# Patient Record
Sex: Female | Born: 1965 | Race: White | Hispanic: No | Smoking: Never smoker
Health system: Southern US, Community
[De-identification: ages and names within clinical notes are randomized; demographics above are authoritative.]

## PROBLEM LIST (undated history)

## (undated) DIAGNOSIS — R519 Headache, unspecified: Secondary | ICD-10-CM

## (undated) DIAGNOSIS — B029 Zoster without complications: Secondary | ICD-10-CM

## (undated) DIAGNOSIS — R51 Headache: Secondary | ICD-10-CM

## (undated) DIAGNOSIS — C801 Malignant (primary) neoplasm, unspecified: Secondary | ICD-10-CM

## (undated) DIAGNOSIS — C539 Malignant neoplasm of cervix uteri, unspecified: Secondary | ICD-10-CM

## (undated) DIAGNOSIS — F419 Anxiety disorder, unspecified: Secondary | ICD-10-CM

## (undated) DIAGNOSIS — K219 Gastro-esophageal reflux disease without esophagitis: Secondary | ICD-10-CM

## (undated) DIAGNOSIS — I10 Essential (primary) hypertension: Secondary | ICD-10-CM

## (undated) DIAGNOSIS — N63 Unspecified lump in unspecified breast: Secondary | ICD-10-CM

## (undated) DIAGNOSIS — E785 Hyperlipidemia, unspecified: Secondary | ICD-10-CM

## (undated) DIAGNOSIS — G43909 Migraine, unspecified, not intractable, without status migrainosus: Secondary | ICD-10-CM

## (undated) HISTORY — DX: Essential (primary) hypertension: I10

## (undated) HISTORY — PX: NASAL SINUS SURGERY: SHX719

## (undated) HISTORY — DX: Migraine, unspecified, not intractable, without status migrainosus: G43.909

## (undated) HISTORY — PX: DILATION AND CURETTAGE OF UTERUS: SHX78

## (undated) HISTORY — DX: Unspecified lump in unspecified breast: N63.0

## (undated) HISTORY — DX: Hyperlipidemia, unspecified: E78.5

## (undated) HISTORY — DX: Malignant (primary) neoplasm, unspecified: C80.1

## (undated) HISTORY — DX: Malignant neoplasm of cervix uteri, unspecified: C53.9

---
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
## 2003-08-26 DIAGNOSIS — C801 Malignant (primary) neoplasm, unspecified: Secondary | ICD-10-CM

## 2003-08-26 DIAGNOSIS — C539 Malignant neoplasm of cervix uteri, unspecified: Secondary | ICD-10-CM

## 2003-08-26 HISTORY — PX: LEEP: SHX91

## 2003-08-26 HISTORY — PX: ABDOMINAL HYSTERECTOMY: SHX81

## 2003-08-26 HISTORY — PX: DIAGNOSTIC LAPAROSCOPY: SUR761

## 2003-08-26 HISTORY — DX: Malignant neoplasm of cervix uteri, unspecified: C53.9

## 2003-08-26 HISTORY — DX: Malignant (primary) neoplasm, unspecified: C80.1

## 2006-03-17 ENCOUNTER — Ambulatory Visit: Payer: Self-pay

## 2006-09-30 ENCOUNTER — Ambulatory Visit: Payer: Self-pay | Admitting: Family Medicine

## 2008-05-16 ENCOUNTER — Ambulatory Visit: Payer: Self-pay | Admitting: Internal Medicine

## 2009-05-31 ENCOUNTER — Ambulatory Visit: Payer: Self-pay | Admitting: Internal Medicine

## 2009-11-13 ENCOUNTER — Ambulatory Visit: Payer: Self-pay | Admitting: Internal Medicine

## 2010-04-09 ENCOUNTER — Ambulatory Visit: Payer: Self-pay

## 2011-04-05 ENCOUNTER — Ambulatory Visit: Payer: Self-pay

## 2011-04-22 ENCOUNTER — Ambulatory Visit: Payer: Self-pay | Admitting: Surgery

## 2012-10-19 ENCOUNTER — Ambulatory Visit: Payer: Self-pay | Admitting: Family Medicine

## 2013-05-10 ENCOUNTER — Encounter: Payer: Self-pay | Admitting: *Deleted

## 2013-05-30 ENCOUNTER — Encounter: Payer: Self-pay | Admitting: General Surgery

## 2013-05-30 ENCOUNTER — Other Ambulatory Visit: Payer: BC Managed Care – PPO

## 2013-05-30 ENCOUNTER — Ambulatory Visit (INDEPENDENT_AMBULATORY_CARE_PROVIDER_SITE_OTHER): Payer: BC Managed Care – PPO | Admitting: General Surgery

## 2013-05-30 VITALS — BP 138/80 | HR 72 | Resp 14 | Ht 69.0 in | Wt 185.0 lb

## 2013-05-30 DIAGNOSIS — N63 Unspecified lump in unspecified breast: Secondary | ICD-10-CM

## 2013-05-30 HISTORY — PX: BREAST CYST ASPIRATION: SHX578

## 2013-05-30 NOTE — Progress Notes (Signed)
Patient ID: Laura Barber, female   DOB: November 25, 1965, 47 y.o.   MRN: [id number]  Chief Complaint  Patient presents with  . Other    left breast lump    HPI Laura Barber is a 47 y.o. female who presents for an evaluation of a left breast lump. Patient states been there for two year. No pain or tender.Patient had her mammogram 05/16/13. She perform self breast check and get regular mammograms. The patient has seen two different doctors who have done mammograms and ultrasounds and were not concerned. No injuries to the breast.  The patient was evaluated locally by Laura Barber, M.D. She also reports having an evaluation with Laura Mao, MD at the [facility] breast imaging center.   HPI  Past Medical History  Diagnosis Date  . Hypertension   . Hyperlipidemia   . Cancer 2005    Cervical    Past Surgical History  Procedure Laterality Date  . Abdominal hysterectomy  2005  . Nasal sinus surgery      Family History  Problem Relation Age of Onset  . Lung cancer Mother     Social History History  Substance Use Topics  . Smoking status: Never Smoker   . Smokeless tobacco: Never Used  . Alcohol Use: Yes    Allergies  Allergen Reactions  . Claritin-D 12 Hour [Loratadine-Pseudoephedrine Er] Itching and Swelling  . Sulfa Antibiotics Itching and Swelling    Current Outpatient Prescriptions  Medication Sig Dispense Refill  . atorvastatin (LIPITOR) 10 MG tablet       . cetirizine (ZYRTEC) 10 MG tablet Take 10 mg by mouth daily.      . Multiple Vitamin (MULTIVITAMIN) tablet Take 1 tablet by mouth daily.      Marland Kitchen omeprazole (PRILOSEC) 20 MG capsule Take 20 mg by mouth daily.      . valsartan-hydrochlorothiazide (DIOVAN-HCT) 80-12.5 MG per tablet       . venlafaxine XR (EFFEXOR-XR) 37.5 MG 24 hr capsule        No current facility-administered medications for this visit.    Review of Systems Review of Systems  Blood pressure 138/80, pulse 72, resp. rate 14, height 5\' 9"  (1.753 m),  weight 185 lb (83.915 kg).  Physical Exam Physical Exam  Constitutional: She is oriented to person, place, and time. She appears well-developed and well-nourished.  Neck: No thyromegaly present.  Cardiovascular: Normal rate, regular rhythm and normal heart sounds.   No murmur heard. Pulmonary/Chest: Effort normal and breath sounds normal. Right breast exhibits no inverted nipple, no mass, no nipple discharge, no skin change and no tenderness. Left breast exhibits no inverted nipple, no mass, no nipple discharge, no skin change and no tenderness.  Left breast 1 cup size larger than right.  (Stable by patient report).  Area of slight fullness in the upper outer quadrant of the left breast. Modest of the lower outer quadrant of the left breast in the 4-6:00 position. A dominant mass is not appreciated.  Lymphadenopathy:    She has no cervical adenopathy.    She has no axillary adenopathy.  Neurological: She is alert and oriented to person, place, and time.  Skin: Skin is warm and dry.    Data Reviewed Bilateral screening mammograms dated 05/10/2013 completed at her GYN office showed heterogeneously dense breasts. BI-RAD-1. (Reviewed). Examination dated 04/30/2012 report an asymmetric density in the left that was stable from prior exams. BI-RAD-2 per  Left breast ultrasound dated 04/22/2011 reported to simple cyst in the upper-outer quadrant  of the left breast. (Reviewed). Mammogram of the same date termed a masslike density in this area but to correlate with the ultrasound. No mammographic or sonographic abnormalities noted in the 5:00 position. The right breast was reported to be unremarkable.  Ultrasound dated 04/09/2010 showed a simple cyst.  Ultrasound examination of the upper-outer quadrant of the left breast completed today did not show the dominant cyst previously identified. A small less than 4 mm simple cyst was identified at the 1:00 position 5 cm from the nipple. At the 6:00  position, 3 cm from the nipple a 0.3 x 0.5 x 0.52 cm simple cyst was identified. This appeared to correlate with the area of concern with her gynecologist. The patient was amenable to aspiration. This was completed using 1 cc of 1% plain Xylocaine with complete resolution. The fluid was discarded.  Assessment    A symptomatic breast cysts.    Plan    The patient was encouraged to do monthly self exams. Here we mammograms with her gynecologist are appropriate. She is welcome to return at any time if there are concerns.       Laura Barber 05/30/2013, 7:55 PM

## 2013-05-30 NOTE — Patient Instructions (Signed)
Patient to continue monthly self breast checks. Patient to return as needed.

## 2013-12-25 ENCOUNTER — Ambulatory Visit: Payer: Self-pay | Admitting: Emergency Medicine

## 2014-06-26 ENCOUNTER — Encounter: Payer: Self-pay | Admitting: General Surgery

## 2014-06-27 DIAGNOSIS — Z8541 Personal history of malignant neoplasm of cervix uteri: Secondary | ICD-10-CM | POA: Insufficient documentation

## 2014-08-24 ENCOUNTER — Ambulatory Visit: Payer: Self-pay | Admitting: Internal Medicine

## 2016-01-31 DIAGNOSIS — M7062 Trochanteric bursitis, left hip: Secondary | ICD-10-CM | POA: Diagnosis not present

## 2016-01-31 DIAGNOSIS — I10 Essential (primary) hypertension: Secondary | ICD-10-CM | POA: Diagnosis not present

## 2016-01-31 DIAGNOSIS — G43019 Migraine without aura, intractable, without status migrainosus: Secondary | ICD-10-CM | POA: Diagnosis not present

## 2016-01-31 DIAGNOSIS — F329 Major depressive disorder, single episode, unspecified: Secondary | ICD-10-CM | POA: Diagnosis not present

## 2016-04-07 ENCOUNTER — Ambulatory Visit
Admission: EM | Admit: 2016-04-07 | Discharge: 2016-04-07 | Disposition: A | Discharge status: Home / Self Care | Payer: BLUE CROSS/BLUE SHIELD | Attending: Emergency Medicine | Admitting: Emergency Medicine

## 2016-04-07 ENCOUNTER — Encounter: Payer: Self-pay | Admitting: *Deleted

## 2016-04-07 DIAGNOSIS — S161XXA Strain of muscle, fascia and tendon at neck level, initial encounter: Secondary | ICD-10-CM

## 2016-04-07 DIAGNOSIS — S46812A Strain of other muscles, fascia and tendons at shoulder and upper arm level, left arm, initial encounter: Secondary | ICD-10-CM | POA: Diagnosis not present

## 2016-04-07 MED ORDER — METAXALONE 800 MG PO TABS: 800.0000 mg | ORAL_TABLET | Freq: Three times a day (TID) | ORAL | 0 refills | Status: DC

## 2016-04-07 MED ORDER — HYDROCODONE-ACETAMINOPHEN 5-325 MG PO TABS: 2.0000 | ORAL_TABLET | ORAL | 0 refills | Status: DC | PRN

## 2016-04-07 MED ORDER — PREDNISONE 10 MG (21) PO TBPK: ORAL_TABLET | ORAL | 0 refills | Status: DC

## 2016-04-07 NOTE — ED Triage Notes (Signed)
Neck pain radiating to left shoulder x1 week. Denies injury.

## 2016-04-07 NOTE — Discharge Instructions (Signed)
Try deep tissue massage. Take the Naprosyn on a regular basis for the next 10 days. Norco as needed for severe pain. Follow-up with the [facility] orthopedic clinic if not better in 10 days to 2 weeks.

## 2016-04-07 NOTE — ED Provider Notes (Signed)
HPI  SUBJECTIVE:  Laura Barber is a 50 y.o. female who presents with constant left-sided neck and "shoulder blade" pain described as tightness, burning, radiating down her arm to her fingertips starting last night . Patient states that she was picking up heavy grandchildren all week and. Symptoms are worse with abduction, lifting objects, better with holding her arm in internal rotation. She tried Naprosyn 550 mg this morning and stretching without improvement. She states that she has had identical pain before when she flexes her neck for prolonged periods of time at work. She states that the radiation is new. She denies fevers, chest pain, shortness of breath, nausea, diaphoresis, exertional positional component of her pain. She reports occasionally tingling, no numbness, arm weakness. No grip weakness. No unintentional weight loss, trauma to her neck or shoulder. She is right-handed. She denies sensation of instability or pop. She has a past medical history of migraines, hypertension, cervical cancer, hypercholesterolemia. No history of diabetes, metastatic disease, history of injury to the C-spine, neck, shoulder, arm or rotator cuff. No history of osteoporosis, osteopenia, prolonged steroid use, IVDU. PMD: [employer]. LMP: Status post hysterectomy   Past Medical History:  Diagnosis Date  . Cancer ([place]) 2005   Cervical  . Hyperlipidemia   . Hypertension     Past Surgical History:  Procedure Laterality Date  . ABDOMINAL HYSTERECTOMY  2005  . NASAL SINUS SURGERY      Family History  Problem Relation Age of Onset  . Lung cancer Mother     Social History  Substance Use Topics  . Smoking status: Never Smoker  . Smokeless tobacco: Never Used  . Alcohol use Yes    No current facility-administered medications for this encounter.   Current Outpatient Prescriptions:  .  atorvastatin (LIPITOR) 10 MG tablet, , Disp: , Rfl:  .  cetirizine (ZYRTEC) 10 MG tablet, Take 10 mg by  mouth daily., Disp: , Rfl:  .  Multiple Vitamin (MULTIVITAMIN) tablet, Take 1 tablet by mouth daily., Disp: , Rfl:  .  omeprazole (PRILOSEC) 20 MG capsule, Take 20 mg by mouth daily., Disp: , Rfl:  .  valsartan-hydrochlorothiazide (DIOVAN-HCT) 80-12.5 MG per tablet, , Disp: , Rfl:  .  venlafaxine XR (EFFEXOR-XR) 37.5 MG 24 hr capsule, , Disp: , Rfl:  .  HYDROcodone-acetaminophen (NORCO/VICODIN) 5-325 MG tablet, Take 2 tablets by mouth every 4 (four) hours as needed for moderate pain., Disp: 20 tablet, Rfl: 0 .  metaxalone (SKELAXIN) 800 MG tablet, Take 1 tablet (800 mg total) by mouth 3 (three) times daily., Disp: 21 tablet, Rfl: 0 .  predniSONE (STERAPRED UNI-PAK 21 TAB) 10 MG (21) TBPK tablet, Dispense one 6 day pack. Take as directed with food., Disp: 21 tablet, Rfl: 0  Allergies  Allergen Reactions  . Claritin-D 12 Hour [Loratadine-Pseudoephedrine Er] Itching and Swelling  . Sulfa Antibiotics Itching and Swelling     ROS  As noted in HPI.   Physical Exam  BP 129/73   Pulse 70   Temp 98.1 F (36.7 C) (Oral)   Resp 16   SpO2 99%   Constitutional: Well developed, well nourished, no acute distress Eyes:  EOMI, conjunctiva normal bilaterally HENT: Normocephalic, atraumatic,mucus membranes moist Respiratory: Normal inspiratory effort Cardiovascular: Normal rate GI: nondistended skin: No rash, skin intact Musculoskeletal: No C-spine, upper T-spine tenderness. Patient able to flex/extend, rotate head 45 without any problem. Positive left trapezial rhomboid tenderness, muscle spasm. No tenderness along the right trapezius or rhomboid. No tenderness along the L  clavicle, AC joint, shoulder joint, bicipital tendon, proximal arm, elbow, forearm, wrist, hand. Pain aggravated with flexion of the neck, rotation of the head to the left, abduction past 90. Negative drop test. Positive empty can test, positive lift off test. No weakness. Grip strength equal 2+ bilaterally. Pain aggravated  with external rotation. No pain with Internal rotation.  no instability with abduction/external rotation. Shoulder range of motion normal. Radial pulse 2+. Sensation grossly intact distally Neuro: alert & oriented x 3, no focal neuro deficits. brachoradialis reflexes 2+ psychiatric: Speech and behavior appropriate   ED Course   Medications - No data to display  No orders of the defined types were placed in this encounter.   No results found for this or any previous visit (from the past 24 hour(s)). No results found.  ED Clinical Impression  Neck strain, initial encounter  Trapezius muscle strain, left, initial encounter   ED Assessment/Plan  [facility] narcotic database reviewed. No narcotic prescriptions in the past 6 months.  Presentation most consistent with trapezial/rhomboid muscle strain/sprain. No evidence of significant brachial plexus injury. She may have a component of shoulder impingement with this, however, she states that the pain is located primarily in the neck and upper back rather than in the shoulder joint itself. Doubt cardiac cause of symptoms. Home with muscle relaxant, Norco, short course of prednisone, regular Naprosyn for the next 10 days. She states that she does not need a prescription of Naprosyn. Advised deep tissue massage. Offered x-rays day, patient declined and think that this is reasonable. Follow-up with [facility] orthopedics if no better in 10 days to 2 weeks with conservative treatment. Gave patient ER precautions. She agrees with plan. Discussed MDM, plan and followup with patient. Discussed sn/sx that should prompt return to the ED. Patient  agrees with plan.   *This clinic note was created using Dragon dictation software. Therefore, there may be occasional mistakes despite careful proofreading.  ?   Melynda Ripple, MD 04/07/16 [phone]

## 2016-04-22 ENCOUNTER — Other Ambulatory Visit: Payer: Self-pay | Admitting: Physical Medicine and Rehabilitation

## 2016-04-22 DIAGNOSIS — M5030 Other cervical disc degeneration, unspecified cervical region: Secondary | ICD-10-CM

## 2016-04-22 DIAGNOSIS — M5412 Radiculopathy, cervical region: Secondary | ICD-10-CM | POA: Diagnosis not present

## 2016-04-30 ENCOUNTER — Ambulatory Visit
Admission: RE | Admit: 2016-04-30 | Discharge: 2016-04-30 | Disposition: A | Discharge status: Home / Self Care | Payer: BLUE CROSS/BLUE SHIELD | Source: Ambulatory Visit | Attending: Physical Medicine and Rehabilitation | Admitting: Physical Medicine and Rehabilitation

## 2016-04-30 DIAGNOSIS — M5030 Other cervical disc degeneration, unspecified cervical region: Secondary | ICD-10-CM | POA: Diagnosis present

## 2016-04-30 DIAGNOSIS — M50221 Other cervical disc displacement at C4-C5 level: Secondary | ICD-10-CM | POA: Insufficient documentation

## 2016-04-30 DIAGNOSIS — M4802 Spinal stenosis, cervical region: Secondary | ICD-10-CM | POA: Insufficient documentation

## 2016-05-06 DIAGNOSIS — M5020 Other cervical disc displacement, unspecified cervical region: Secondary | ICD-10-CM | POA: Diagnosis not present

## 2016-05-06 DIAGNOSIS — M5412 Radiculopathy, cervical region: Secondary | ICD-10-CM | POA: Diagnosis not present

## 2016-05-08 DIAGNOSIS — M5010 Cervical disc disorder with radiculopathy, unspecified cervical region: Secondary | ICD-10-CM | POA: Diagnosis not present

## 2016-05-09 DIAGNOSIS — L918 Other hypertrophic disorders of the skin: Secondary | ICD-10-CM | POA: Diagnosis not present

## 2016-05-09 DIAGNOSIS — Z1283 Encounter for screening for malignant neoplasm of skin: Secondary | ICD-10-CM | POA: Diagnosis not present

## 2016-05-09 DIAGNOSIS — D485 Neoplasm of uncertain behavior of skin: Secondary | ICD-10-CM | POA: Diagnosis not present

## 2016-05-09 DIAGNOSIS — L570 Actinic keratosis: Secondary | ICD-10-CM | POA: Diagnosis not present

## 2016-06-04 ENCOUNTER — Encounter
Admission: RE | Admit: 2016-06-04 | Discharge: 2016-06-04 | Disposition: A | Discharge status: Home / Self Care | Payer: BLUE CROSS/BLUE SHIELD | Source: Ambulatory Visit | Attending: Neurological Surgery | Admitting: Neurological Surgery

## 2016-06-04 DIAGNOSIS — Z01818 Encounter for other preprocedural examination: Secondary | ICD-10-CM | POA: Insufficient documentation

## 2016-06-04 DIAGNOSIS — I10 Essential (primary) hypertension: Secondary | ICD-10-CM | POA: Insufficient documentation

## 2016-06-04 HISTORY — DX: Gastro-esophageal reflux disease without esophagitis: K21.9

## 2016-06-04 HISTORY — DX: Headache: R51

## 2016-06-04 HISTORY — DX: Headache, unspecified: R51.9

## 2016-06-04 HISTORY — DX: Anxiety disorder, unspecified: F41.9

## 2016-06-04 LAB — TYPE AND SCREEN
ABO/RH(D): A POS
Antibody Screen: NEGATIVE

## 2016-06-04 LAB — DIFFERENTIAL
Basophils Absolute: 0.1 K/uL (ref 0–0.1)
Basophils Relative: 1 %
Eosinophils Absolute: 0.2 K/uL (ref 0–0.7)
Eosinophils Relative: 2 %
Lymphocytes Relative: 27 %
Lymphs Abs: 2.4 K/uL (ref 1.0–3.6)
Monocytes Absolute: 0.7 K/uL (ref 0.2–0.9)
Monocytes Relative: 8 %
Neutro Abs: 5.5 K/uL (ref 1.4–6.5)
Neutrophils Relative %: 62 %

## 2016-06-04 LAB — CBC
HCT: 42.1 % (ref 35.0–47.0)
Hemoglobin: 14.8 g/dL (ref 12.0–16.0)
MCH: 31.7 pg (ref 26.0–34.0)
MCHC: 35.1 g/dL (ref 32.0–36.0)
MCV: 90.3 fL (ref 80.0–100.0)
PLATELETS: 238 10*3/uL (ref 150–440)
RBC: 4.66 MIL/uL (ref 3.80–5.20)
RDW: 13.9 % (ref 11.5–14.5)
WBC: 8.9 10*3/uL (ref 3.6–11.0)

## 2016-06-04 LAB — BASIC METABOLIC PANEL WITH GFR
Anion gap: 10 (ref 5–15)
BUN: 16 mg/dL (ref 6–20)
CO2: 29 mmol/L (ref 22–32)
Calcium: 9.8 mg/dL (ref 8.9–10.3)
Chloride: 100 mmol/L — ABNORMAL LOW (ref 101–111)
Creatinine, Ser: 1 mg/dL (ref 0.44–1.00)
GFR calc Af Amer: >60 mL/min
GFR calc non Af Amer: >60 mL/min
Glucose, Bld: 75 mg/dL (ref 65–99)
Potassium: 4 mmol/L (ref 3.5–5.1)
Sodium: 139 mmol/L (ref 135–145)

## 2016-06-04 LAB — SURGICAL PCR SCREEN
MRSA, PCR: NEGATIVE
Staphylococcus aureus: NEGATIVE

## 2016-06-04 LAB — PROTIME-INR
INR: 1.05
Prothrombin Time: 13.7 s (ref 11.4–15.2)

## 2016-06-04 LAB — APTT: aPTT: 35 seconds (ref 24–36)

## 2016-06-04 NOTE — Patient Instructions (Signed)
  Your procedure is scheduled [id number] 18, 2017 (Wednesday) Report to [facility] Day [facility] 2nd floor [facility] To find out your arrival time please call [phone] between 1PM - 3PM on  June 10, 2016 (Tuesday)  Remember: Instructions that are not followed completely may result in serious medical risk, up to and including death, or upon the discretion of your surgeon and anesthesiologist your surgery may need to be rescheduled.    _x___ 1. Do not eat food or drink liquids after midnight. No gum chewing or hard candies.     _x__ 2. No Alcohol for 24 hours before or after surgery.   _x  __3. No Smoking for 24 prior to surgery.   ____  4. Bring all medications with you on the day of surgery if instructed.    __x__ 5. Notify your doctor if there is any change in your medical condition     (cold, fever, infections).     Do not wear jewelry, make-up, hairpins, clips or nail polish.  Do not wear lotions, powders, or perfumes. You may wear deodorant.  Do not shave 48 hours prior to surgery. Men may shave face and neck.  Do not bring valuables to the hospital.    [facility] is not responsible for any belongings or valuables.               Contacts, dentures or bridgework may not be worn into surgery.  Leave your suitcase in the car. After surgery it may be brought to your room.  For patients admitted to the hospital, discharge time is determined by your treatment team.   Patients discharged the day of surgery will not be allowed to drive home.    Please read over the following fact sheets that you were given:   [facility] Preparing for Surgery and or MRSA Information   _x___ Take these medicines the morning of surgery with A SIP OF WATER:    1. Prilosec (Prilosec at bedtime on Tuesday night prior to surgery)  2. Effexor  3.  4.  5.  6.  ____Fleets enema or Magnesium Citrate as directed.   _x___ Use CHG Soap or sage wipes as directed on instruction sheet   ____ Use  inhalers on the day of surgery and bring to hospital day of surgery  ____ Stop metformin 2 days prior to surgery    ____ Take 1/2 of usual insulin dose the night before surgery and none on the morning of           surgery.   ____ Stop aspirin or coumadin, or plavix  x__ Stop Anti-inflammatories such as Advil, Aleve, Ibuprofen, Motrin, Naproxen,          Naprosyn, Goodies powders or aspirin products. Ok to take Tylenol.   ____ Stop supplements until after surgery.    ____ Bring C-Pap to the hospital.

## 2016-06-11 ENCOUNTER — Encounter
Admission: RE | Disposition: A | Discharge status: Home / Self Care | Payer: Self-pay | Source: Ambulatory Visit | Attending: Neurological Surgery

## 2016-06-11 ENCOUNTER — Observation Stay: Payer: BLUE CROSS/BLUE SHIELD

## 2016-06-11 ENCOUNTER — Ambulatory Visit: Payer: BLUE CROSS/BLUE SHIELD

## 2016-06-11 ENCOUNTER — Encounter: Payer: Self-pay | Admitting: *Deleted

## 2016-06-11 ENCOUNTER — Ambulatory Visit: Payer: BLUE CROSS/BLUE SHIELD | Admitting: Anesthesiology

## 2016-06-11 ENCOUNTER — Observation Stay
Admission: RE | Admit: 2016-06-11 | Discharge: 2016-06-12 | Disposition: A | Discharge status: Home / Self Care | Payer: BLUE CROSS/BLUE SHIELD | Source: Ambulatory Visit | Attending: Neurological Surgery | Admitting: Neurological Surgery

## 2016-06-11 DIAGNOSIS — Z8541 Personal history of malignant neoplasm of cervix uteri: Secondary | ICD-10-CM | POA: Insufficient documentation

## 2016-06-11 DIAGNOSIS — F419 Anxiety disorder, unspecified: Secondary | ICD-10-CM | POA: Diagnosis not present

## 2016-06-11 DIAGNOSIS — Z888 Allergy status to other drugs, medicaments and biological substances status: Secondary | ICD-10-CM | POA: Insufficient documentation

## 2016-06-11 DIAGNOSIS — E785 Hyperlipidemia, unspecified: Secondary | ICD-10-CM | POA: Insufficient documentation

## 2016-06-11 DIAGNOSIS — Z90710 Acquired absence of both cervix and uterus: Secondary | ICD-10-CM | POA: Insufficient documentation

## 2016-06-11 DIAGNOSIS — M5412 Radiculopathy, cervical region: Secondary | ICD-10-CM | POA: Diagnosis not present

## 2016-06-11 DIAGNOSIS — M4322 Fusion of spine, cervical region: Secondary | ICD-10-CM | POA: Diagnosis not present

## 2016-06-11 DIAGNOSIS — M5116 Intervertebral disc disorders with radiculopathy, lumbar region: Secondary | ICD-10-CM | POA: Diagnosis not present

## 2016-06-11 DIAGNOSIS — K219 Gastro-esophageal reflux disease without esophagitis: Secondary | ICD-10-CM | POA: Diagnosis not present

## 2016-06-11 DIAGNOSIS — M50222 Other cervical disc displacement at C5-C6 level: Secondary | ICD-10-CM | POA: Diagnosis not present

## 2016-06-11 DIAGNOSIS — M4802 Spinal stenosis, cervical region: Principal | ICD-10-CM | POA: Diagnosis present

## 2016-06-11 DIAGNOSIS — Z419 Encounter for procedure for purposes other than remedying health state, unspecified: Secondary | ICD-10-CM

## 2016-06-11 DIAGNOSIS — Z882 Allergy status to sulfonamides status: Secondary | ICD-10-CM | POA: Insufficient documentation

## 2016-06-11 DIAGNOSIS — I10 Essential (primary) hypertension: Secondary | ICD-10-CM | POA: Diagnosis not present

## 2016-06-11 HISTORY — PX: CERVICAL DISC ARTHROPLASTY: SHX587

## 2016-06-11 LAB — CBC
HCT: 38.2 % (ref 35.0–47.0)
Hemoglobin: 13.6 g/dL (ref 12.0–16.0)
MCH: 31.7 pg (ref 26.0–34.0)
MCHC: 35.6 g/dL (ref 32.0–36.0)
MCV: 88.9 fL (ref 80.0–100.0)
PLATELETS: 175 10*3/uL (ref 150–440)
RBC: 4.3 MIL/uL (ref 3.80–5.20)
RDW: 13.5 % (ref 11.5–14.5)
WBC: 13 10*3/uL — ABNORMAL HIGH (ref 3.6–11.0)

## 2016-06-11 LAB — CREATININE, SERUM
CREATININE: 0.84 mg/dL (ref 0.44–1.00)
GFR calc Af Amer: >60 mL/min (ref >60)
GFR calc non Af Amer: >60 mL/min (ref >60)

## 2016-06-11 LAB — ABO/RH: ABO/RH(D): A POS

## 2016-06-11 SURGERY — CERVICAL ANTERIOR DISC ARTHROPLASTY
Anesthesia: General | Laterality: Right

## 2016-06-11 MED ORDER — CEFAZOLIN SODIUM-DEXTROSE 2-4 GM/100ML-% IV SOLN
INTRAVENOUS | Status: AC
  Filled 2016-06-11: qty 100

## 2016-06-11 MED ORDER — SODIUM CHLORIDE 0.9 % IR SOLN
Status: DC | PRN
  Administered 2016-06-11: 5000 mL

## 2016-06-11 MED ORDER — BACITRACIN 50000 UNITS IM SOLR
INTRAMUSCULAR | Status: AC
  Filled 2016-06-11: qty 1

## 2016-06-11 MED ORDER — GELATIN ABSORBABLE 12-7 MM EX MISC
CUTANEOUS | Status: AC
  Filled 2016-06-11: qty 1

## 2016-06-11 MED ORDER — MIDAZOLAM HCL 2 MG/2ML IJ SOLN
INTRAMUSCULAR | Status: DC | PRN
  Administered 2016-06-11: 2 mg via INTRAVENOUS

## 2016-06-11 MED ORDER — SODIUM CHLORIDE 0.9% FLUSH: 3.0000 mL | Freq: Two times a day (BID) | INTRAVENOUS | Status: DC

## 2016-06-11 MED ORDER — ACETAMINOPHEN 650 MG RE SUPP: 650.0000 mg | RECTAL | Status: DC | PRN

## 2016-06-11 MED ORDER — MENTHOL 3 MG MT LOZG
1.0000 | LOZENGE | OROMUCOSAL | Status: DC | PRN
  Filled 2016-06-11: qty 9

## 2016-06-11 MED ORDER — BISACODYL 5 MG PO TBEC: 5.0000 mg | DELAYED_RELEASE_TABLET | Freq: Every day | ORAL | Status: DC | PRN

## 2016-06-11 MED ORDER — ACETAMINOPHEN 325 MG PO TABS: 650.0000 mg | ORAL_TABLET | ORAL | Status: DC | PRN

## 2016-06-11 MED ORDER — LACTATED RINGERS IV SOLN
INTRAVENOUS | Status: DC
  Administered 2016-06-11: 11:00:00 via INTRAVENOUS

## 2016-06-11 MED ORDER — POTASSIUM CHLORIDE IN NACL 20-0.9 MEQ/L-% IV SOLN
INTRAVENOUS | Status: DC
  Administered 2016-06-11: 22:00:00 via INTRAVENOUS
  Filled 2016-06-11 (×3): qty 1000

## 2016-06-11 MED ORDER — SCOPOLAMINE 1 MG/3DAYS TD PT72
MEDICATED_PATCH | TRANSDERMAL | Status: AC
  Filled 2016-06-11: qty 1

## 2016-06-11 MED ORDER — ONDANSETRON HCL 4 MG/2ML IJ SOLN: 4.0000 mg | INTRAMUSCULAR | Status: DC | PRN

## 2016-06-11 MED ORDER — CEFAZOLIN SODIUM-DEXTROSE 2-4 GM/100ML-% IV SOLN
2.0000 g | Freq: Once | INTRAVENOUS | Status: AC
  Administered 2016-06-11: 2 g via INTRAVENOUS
  Administered 2016-06-11: 1 g via INTRAVENOUS

## 2016-06-11 MED ORDER — KETOROLAC TROMETHAMINE 30 MG/ML IJ SOLN: 30.0000 mg | Freq: Once | INTRAMUSCULAR | Status: AC

## 2016-06-11 MED ORDER — BUPIVACAINE-EPINEPHRINE 0.5% -1:200000 IJ SOLN
INTRAMUSCULAR | Status: DC | PRN
Start: 2016-06-11 — End: 2016-06-11
  Administered 2016-06-11: 10 mL

## 2016-06-11 MED ORDER — LIDOCAINE-EPINEPHRINE 1 %-1:100000 IJ SOLN
INTRAMUSCULAR | Status: AC
  Filled 2016-06-11: qty 1

## 2016-06-11 MED ORDER — FENTANYL CITRATE (PF) 100 MCG/2ML IJ SOLN
INTRAMUSCULAR | Status: AC
  Administered 2016-06-11: 25 ug via INTRAVENOUS
  Filled 2016-06-11: qty 2

## 2016-06-11 MED ORDER — GLYCOPYRROLATE 0.2 MG/ML IJ SOLN
INTRAMUSCULAR | Status: DC | PRN
  Administered 2016-06-11: 0.2 mg via INTRAVENOUS

## 2016-06-11 MED ORDER — SENNOSIDES-DOCUSATE SODIUM 8.6-50 MG PO TABS: 1.0000 | ORAL_TABLET | Freq: Every evening | ORAL | Status: DC | PRN

## 2016-06-11 MED ORDER — THROMBIN 5000 UNITS EX SOLR
CUTANEOUS | Status: AC
  Filled 2016-06-11: qty 5000

## 2016-06-11 MED ORDER — SODIUM CHLORIDE 0.9 % IJ SOLN
INTRAMUSCULAR | Status: AC
  Filled 2016-06-11: qty 10

## 2016-06-11 MED ORDER — SUCCINYLCHOLINE CHLORIDE 20 MG/ML IJ SOLN
INTRAMUSCULAR | Status: DC | PRN
  Administered 2016-06-11: 100 mg via INTRAVENOUS

## 2016-06-11 MED ORDER — PHENOL 1.4 % MT LIQD
1.0000 | OROMUCOSAL | Status: DC | PRN
  Filled 2016-06-11: qty 177

## 2016-06-11 MED ORDER — SODIUM CHLORIDE 0.9% FLUSH: 3.0000 mL | INTRAVENOUS | Status: DC | PRN

## 2016-06-11 MED ORDER — OXYCODONE-ACETAMINOPHEN 5-325 MG PO TABS
1.0000 | ORAL_TABLET | ORAL | Status: DC | PRN
  Administered 2016-06-11 – 2016-06-12 (×2): 1 via ORAL
  Filled 2016-06-11 (×2): qty 1

## 2016-06-11 MED ORDER — BUPIVACAINE-EPINEPHRINE (PF) 0.5% -1:200000 IJ SOLN
INTRAMUSCULAR | Status: AC
  Filled 2016-06-11: qty 30

## 2016-06-11 MED ORDER — LIDOCAINE HCL (CARDIAC) 20 MG/ML IV SOLN
INTRAVENOUS | Status: DC | PRN
  Administered 2016-06-11: 100 mg via INTRAVENOUS

## 2016-06-11 MED ORDER — CEFAZOLIN IN D5W 1 GM/50ML IV SOLN
1.0000 g | Freq: Three times a day (TID) | INTRAVENOUS | Status: DC
  Filled 2016-06-11 (×2): qty 50

## 2016-06-11 MED ORDER — PHENYLEPHRINE HCL 10 MG/ML IJ SOLN
INTRAVENOUS | Status: DC | PRN
  Administered 2016-06-11: 15 ug/min via INTRAVENOUS

## 2016-06-11 MED ORDER — PROPOFOL 10 MG/ML IV BOLUS
INTRAVENOUS | Status: DC | PRN
  Administered 2016-06-11: 150 mg via INTRAVENOUS

## 2016-06-11 MED ORDER — CEFAZOLIN IN D5W 1 GM/50ML IV SOLN
1.0000 g | Freq: Three times a day (TID) | INTRAVENOUS | Status: DC
  Administered 2016-06-11 – 2016-06-12 (×2): 1 g via INTRAVENOUS
  Filled 2016-06-11 (×3): qty 50

## 2016-06-11 MED ORDER — METHYLPREDNISOLONE ACETATE 40 MG/ML IJ SUSP
INTRAMUSCULAR | Status: AC
  Filled 2016-06-11: qty 1

## 2016-06-11 MED ORDER — LACTATED RINGERS IV SOLN
INTRAVENOUS | Status: DC
  Administered 2016-06-11 (×2): via INTRAVENOUS

## 2016-06-11 MED ORDER — OXYCODONE HCL 5 MG PO TABS: 5.0000 mg | ORAL_TABLET | Freq: Once | ORAL | Status: DC | PRN

## 2016-06-11 MED ORDER — KETOROLAC TROMETHAMINE 30 MG/ML IJ SOLN
INTRAMUSCULAR | Status: DC | PRN
  Administered 2016-06-11: 30 mg via INTRAVENOUS

## 2016-06-11 MED ORDER — REMIFENTANIL HCL 1 MG IV SOLR
INTRAVENOUS | Status: DC | PRN
  Administered 2016-06-11: .2 ug/kg/min via INTRAVENOUS

## 2016-06-11 MED ORDER — GELATIN ABSORBABLE 12-7 MM EX MISC
CUTANEOUS | Status: DC | PRN
  Administered 2016-06-11: 1

## 2016-06-11 MED ORDER — SCOPOLAMINE 1 MG/3DAYS TD PT72
1.0000 | MEDICATED_PATCH | TRANSDERMAL | Status: DC
  Administered 2016-06-11: 1.5 mg via TRANSDERMAL

## 2016-06-11 MED ORDER — DIAZEPAM 5 MG PO TABS: 5.0000 mg | ORAL_TABLET | Freq: Four times a day (QID) | ORAL | Status: DC | PRN

## 2016-06-11 MED ORDER — SODIUM CHLORIDE 0.9 % IV SOLN: 250.0000 mL | INTRAVENOUS | Status: DC

## 2016-06-11 MED ORDER — HEPARIN SODIUM (PORCINE) 5000 UNIT/ML IJ SOLN
5000.0000 [IU] | Freq: Three times a day (TID) | INTRAMUSCULAR | Status: DC
  Administered 2016-06-11 – 2016-06-12 (×2): 5000 [IU] via SUBCUTANEOUS
  Filled 2016-06-11 (×2): qty 1

## 2016-06-11 MED ORDER — PROPOFOL 500 MG/50ML IV EMUL
INTRAVENOUS | Status: DC | PRN
  Administered 2016-06-11: 150 ug/kg/min via INTRAVENOUS

## 2016-06-11 MED ORDER — KETAMINE HCL 50 MG/ML IJ SOLN
INTRAMUSCULAR | Status: DC | PRN
  Administered 2016-06-11: 40 mg via INTRAVENOUS
  Administered 2016-06-11: 40 mg via INTRAMUSCULAR

## 2016-06-11 MED ORDER — ALUM & MAG HYDROXIDE-SIMETH 200-200-20 MG/5ML PO SUSP: 30.0000 mL | Freq: Four times a day (QID) | ORAL | Status: DC | PRN

## 2016-06-11 MED ORDER — ONDANSETRON HCL 4 MG/2ML IJ SOLN
INTRAMUSCULAR | Status: DC | PRN
  Administered 2016-06-11: 4 mg via INTRAVENOUS

## 2016-06-11 MED ORDER — THROMBIN 5000 UNITS EX SOLR
CUTANEOUS | Status: DC | PRN
  Administered 2016-06-11: 5000 [IU] via TOPICAL

## 2016-06-11 MED ORDER — OXYCODONE HCL 5 MG/5ML PO SOLN: 5.0000 mg | Freq: Once | ORAL | Status: DC | PRN

## 2016-06-11 MED ORDER — HYDROCODONE-ACETAMINOPHEN 5-325 MG PO TABS: 1.0000 | ORAL_TABLET | ORAL | Status: DC | PRN

## 2016-06-11 MED ORDER — BUPIVACAINE-EPINEPHRINE (PF) 0.25% -1:200000 IJ SOLN
INTRAMUSCULAR | Status: AC
  Filled 2016-06-11: qty 30

## 2016-06-11 MED ORDER — FENTANYL CITRATE (PF) 100 MCG/2ML IJ SOLN
25.0000 ug | INTRAMUSCULAR | Status: DC | PRN
  Administered 2016-06-11 (×2): 25 ug via INTRAVENOUS

## 2016-06-11 MED ORDER — DEXAMETHASONE SODIUM PHOSPHATE 4 MG/ML IJ SOLN
INTRAMUSCULAR | Status: DC | PRN
  Administered 2016-06-11: 5 mg via INTRAVENOUS

## 2016-06-11 SURGICAL SUPPLY — 57 items
BAND RUBBER 3X1/6 TAN STRL (MISCELLANEOUS) ×4 IMPLANT
BLADE BOVIE TIP EXT 4 (BLADE) ×2 IMPLANT
BLADE SURG 15 STRL LF DISP TIS (BLADE) ×1 IMPLANT
BLADE SURG 15 STRL SS (BLADE) ×1
BUR NEURO DRILL SOFT 3.0X3.8M (BURR) ×2 IMPLANT
CANISTER SUCT 1200ML W/VALVE (MISCELLANEOUS) ×2 IMPLANT
COUNTER NEEDLE 20/40 LG (NEEDLE) ×2 IMPLANT
COVER LIGHT HANDLE STERIS (MISCELLANEOUS) ×4 IMPLANT
CRADLE LAMINECT ARM (MISCELLANEOUS) ×2 IMPLANT
CUP MEDICINE 2OZ PLAST GRAD ST (MISCELLANEOUS) ×2 IMPLANT
DISC MOBI-C CERVICAL 13X15 H5 (Miscellaneous) ×6 IMPLANT
DRAPE C-ARM XRAY 36X54 (DRAPES) ×4 IMPLANT
DRAPE C-ARMOR (DRAPES) ×2 IMPLANT
DRAPE INCISE IOBAN 66X45 STRL (DRAPES) ×4 IMPLANT
DRAPE MICROSCOPE LEICA (MISCELLANEOUS) ×2 IMPLANT
DRAPE POUCH INSTRU U-SHP 10X18 (DRAPES) ×2 IMPLANT
DRAPE SHEET LG 3/4 BI-LAMINATE (DRAPES) ×6 IMPLANT
DRAPE SURG 17X11 SM STRL (DRAPES) ×4 IMPLANT
DRAPE TABLE BACK 80X90 (DRAPES) ×2 IMPLANT
DRAPE THYROID T SHEET (DRAPES) ×2 IMPLANT
ELECT CAUTERY BLADE TIP 2.5 (TIP) ×2
ELECTRODE CAUTERY BLDE TIP 2.5 (TIP) ×1 IMPLANT
FEE INTRAOP MONITOR IMPULS NCS (MISCELLANEOUS) ×1 IMPLANT
GAUZE SPONGE 4X4 12PLY STRL (GAUZE/BANDAGES/DRESSINGS) ×2 IMPLANT
GLOVE BIO SURGEON STRL SZ8 (GLOVE) ×4 IMPLANT
GLOVE BIOGEL PI IND STRL 8 (GLOVE) ×1 IMPLANT
GLOVE BIOGEL PI INDICATOR 8 (GLOVE) ×1
GOWN STRL REUS W/ TWL LRG LVL3 (GOWN DISPOSABLE) ×1 IMPLANT
GOWN STRL REUS W/ TWL XL LVL3 (GOWN DISPOSABLE) ×1 IMPLANT
GOWN STRL REUS W/TWL LRG LVL3 (GOWN DISPOSABLE) ×1
GOWN STRL REUS W/TWL XL LVL3 (GOWN DISPOSABLE) ×1
GRADUATE 1200CC STRL 31836 (MISCELLANEOUS) ×2 IMPLANT
INTRAOP MONITOR FEE IMPULS NCS (MISCELLANEOUS) ×1
INTRAOP MONITOR FEE IMPULSE (MISCELLANEOUS) ×1
KIT RM TURNOVER STRD PROC AR (KITS) ×2 IMPLANT
MARKER SKIN DUAL TIP RULER LAB (MISCELLANEOUS) ×2 IMPLANT
NEEDLE HYPO 22GX1.5 SAFETY (NEEDLE) ×4 IMPLANT
NS IRRIG 1000ML POUR BTL (IV SOLUTION) ×2 IMPLANT
PACK LAMINECTOMY NEURO (CUSTOM PROCEDURE TRAY) ×2 IMPLANT
PIN CASPAR 14 (PIN) ×1 IMPLANT
PIN CASPAR 14MM (PIN) ×2
PIN CASPAR SPINAL 14MM CERVICA (PIN) ×2 IMPLANT
SOL PREP PVP 2OZ (MISCELLANEOUS)
SOLUTION PREP PVP 2OZ (MISCELLANEOUS) IMPLANT
SPOGE SURGIFLO 8M (HEMOSTASIS) ×1
SPONGE KITTNER 5P (MISCELLANEOUS) ×2 IMPLANT
SPONGE SURGIFLO 8M (HEMOSTASIS) ×1 IMPLANT
STRIP CLOSURE SKIN 1/2X4 (GAUZE/BANDAGES/DRESSINGS) ×2 IMPLANT
SUT VIC AB 3-0 SH 27 (SUTURE) ×2
SUT VIC AB 3-0 SH 27X BRD (SUTURE) ×2 IMPLANT
SUT VIC AB 3-0 SH 8-18 (SUTURE) ×4 IMPLANT
SWABSTK COMLB BENZOIN TINCTURE (MISCELLANEOUS) IMPLANT
TAPE MICROPORE 2IN (TAPE) ×2 IMPLANT
TOWEL OR 17X26 4PK STRL BLUE (TOWEL DISPOSABLE) ×4 IMPLANT
TRAY FOLEY W/METER SILVER 16FR (SET/KITS/TRAYS/PACK) ×2 IMPLANT
TRAY PREP VAG/GEN (MISCELLANEOUS) IMPLANT
TUBING CONNECTING 10 (TUBING) ×2 IMPLANT

## 2016-06-11 NOTE — Anesthesia Postprocedure Evaluation (Signed)
Anesthesia Post Note  Patient: Laura Barber  Procedure(s) Performed: Procedure(s) (LRB): CERVICAL ANTERIOR DISC ARTHROPLASTY CERVICAL 5-7 (Right)  Patient location during evaluation: PACU Anesthesia Type: General Level of consciousness: awake and alert Pain management: pain level controlled Vital Signs Assessment: post-procedure vital signs reviewed and stable Respiratory status: spontaneous breathing, nonlabored ventilation, respiratory function stable and patient connected to nasal cannula oxygen Cardiovascular status: blood pressure returned to baseline and stable Postop Assessment: no signs of nausea or vomiting Anesthetic complications: no    Last Vitals:  Vitals:   06/11/16 1835 06/11/16 1845  BP:  135/85  Pulse: 96 (!) 103  Resp: 12 12  Temp:  37.3 C    Last Pain:  Vitals:   06/11/16 1845  TempSrc:   PainSc: 3                  Martha Clan

## 2016-06-11 NOTE — Anesthesia Preprocedure Evaluation (Signed)
Anesthesia Evaluation  Patient identified by MRN, date of birth, ID band Patient awake    Reviewed: Allergy & Precautions, H&P , NPO status , Patient's Chart, lab work & pertinent test results  History of Anesthesia Complications (+) PONV and history of anesthetic complications  Airway Mallampati: II  TM Distance: >3 FB Neck ROM: limited    Dental no notable dental hx. (+) Poor Dentition   Pulmonary neg pulmonary ROS, neg shortness of breath,    Pulmonary exam normal breath sounds clear to auscultation       Cardiovascular Exercise Tolerance: Good hypertension, (-) angina(-) Past MI and (-) DOE Normal cardiovascular exam Rhythm:regular Rate:Normal     Neuro/Psych  Headaches, Anxiety negative psych ROS   GI/Hepatic Neg liver ROS, GERD  Controlled,  Endo/Other  negative endocrine ROS  Renal/GU      Musculoskeletal   Abdominal   Peds  Hematology negative hematology ROS (+)   Anesthesia Other Findings Patients endorses baseline numbness in left hand.  No new symptoms with neck extension.   Past Medical History: No date: Anxiety 2005: Cancer ([place])     Comment: Cervical No date: GERD (gastroesophageal reflux disease) No date: Headache No date: Hyperlipidemia No date: Hypertension  Past Surgical History: 2005: ABDOMINAL HYSTERECTOMY No date: NASAL SINUS SURGERY  BMI    Body Mass Index:  25.40 kg/m      Reproductive/Obstetrics negative OB ROS                             Anesthesia Physical Anesthesia Plan  ASA: III  Anesthesia Plan: General ETT   Post-op Pain Management:    Induction:   Airway Management Planned:   Additional Equipment:   Intra-op Plan:   Post-operative Plan:   Informed Consent: I have reviewed the patients History and Physical, chart, labs and discussed the procedure including the risks, benefits and alternatives for the proposed anesthesia with the  patient or authorized representative who has indicated his/her understanding and acceptance.     Plan Discussed with: Anesthesiologist, CRNA and Surgeon  Anesthesia Plan Comments:         Anesthesia Quick Evaluation

## 2016-06-11 NOTE — H&P (Signed)
Chief Complaint: Neck pain, left upper extremity numbness and weakness  History of Present Illness: Laura Barber is a 50 y.o. female who presents with the chief complaint of neck pain and left upper extremity numbness and weakness. She states that this has been going on since early August. Her neck pain usually gets worse by the end of the week, from sitting for too long at a desk, and usually she has relief over the weekend. A few weeks ago, she spent the weekend carrying her grandchildren and she noted that in addition to her neck pain and left scapular pain, she started to have constant numbness of her left thumb, index and middle fingers. She tried some gabapentin which helped a little, but made her groggy. She saw Dr. Sharlet Salina who ordered a cervical MRI and was referred by him to see her.  Krystl Zuker has no symptoms of cervical myelopathy.  The symptoms are causing a significant impact on the patient's life.   Review of Systems:  A 10 point review of systems is negative, except for the pertinent positives and negatives detailed in the HPI.  Past Medical History: Past Medical History:  Diagnosis Date  . Cervical cancer (CMS-HCC)  . Hyperlipidemia  . Hypertension  . Migraines  . Seasonal allergies   Past Surgical History: Past Surgical History:  Procedure Laterality Date  . FUNCTIONAL ENDOSCOPIC SINUS SURGERY  . HYSTERECTOMY   Allergies: Allergies as of 05/08/2016 Elta Guadeloupe as Reviewed 05/08/2016  Allergen Reaction Noted  . Others Itching  . Sulfa (sulfonamide antibiotics) Swelling and Itching 05/30/2013   Medications: Outpatient Encounter Prescriptions as of 05/08/2016  Medication Sig Dispense Refill  . atorvastatin (LIPITOR) 10 MG tablet Take 1 tablet by mouth once daily. 1  . gabapentin (NEURONTIN) 300 MG capsule 1 po qHS #30 5RF 30 capsule 5  . multivitamin tablet Take 1 tablet by mouth once daily.  . naproxen sodium (ANAPROX DS) 550 MG tablet Take 1 tablet by mouth  once as needed. 1  . rizatriptan (MAXALT) 5 MG tablet Take 1 tablet by mouth continuously as needed. 0  . valACYclovir (VALTREX) 1000 MG tablet Take 1 tablet by mouth continuously as needed. 2  . valsartan-hydrochlorothiazide (DIOVAN-HCT) 80-12.5 mg tablet Take 1 tablet by mouth once daily. 1  . venlafaxine (EFFEXOR-XR) 37.5 MG XR capsule Take 1 capsule by mouth once daily. 2  . [DISCONTINUED] predniSONE (DELTASONE) 10 MG tablet Take as directed - 12 day taper 48 tablet 0   No facility-administered encounter medications on file as of 05/08/2016.   Social History: Social History  Substance Use Topics  . Smoking status: Never Smoker  . Smokeless tobacco: Never Used  . Alcohol use 0.0 oz/week  0 Standard drinks or equivalent per week   Family Medical History: Family History  Problem Relation Age of Onset  . Hyperlipidemia Mother  . Lung cancer Mother  . Diabetes type II Father  . Heart disease Father  . Hypertension Father   Physical Examination: Vitals:  05/08/16 1034  BP: 120/79  Pulse: 86  Temp: 36.8 C (98.2 F)  TempSrc: Oral  Weight: 81.2 kg (179 lb)  Height: 177.8 cm (5\' 10" )  PainSc: 5  PainLoc: Neck   General: Patient is well developed, well nourished, calm, collected, and in no apparent distress.  Psychiatric: Patient is non-anxious.  Head: Pupils equal, round, and reactive to light.  ENT: Oral mucosa appears well hydrated.  Neck: Supple. Full range of motion.  Respiratory: Patient is breathing without any  difficulty.  Extremities: No edema.  Vascular: Palpable pulses.  Skin: On exposed skin, there are no abnormal skin lesions.  NEUROLOGICAL:  General: In no acute distress.  Awake, alert, oriented to person, place, and time. Pupils equal round and reactive to light. Facial tone is symmetric. Tongue protrusion is midline. There is no pronator drift.    Strength: Side Biceps Triceps Deltoid Interossei Grip Wrist Ext. Wrist Flex.  R 5 5 5 5 5 5 5   L  5 4++ 5 5 5 5 5    Side Iliopsoas Quads Hamstring PF DF EHL  R 5 5 5 5 5 5   L 5 5 5 5 5 5    Reflexes are 2+ and symmetric at the biceps, triceps, brachioradialis, patella and achilles. Bilateral upper and lower extremity sensation is intact to light touch and pin prick, except decreased in left C6,7 distribution. Clonus is not present. Toes are down-going. Gait is normal. No difficulty with tandem gait. Hoffman's is absent.  Imaging: MRI cervical spine shows large herniated discs at C5-6, and C6-7 with compression of the left C6 and C7 nerves.  I have personally reviewed the images and agree with the above interpretation.  Assessment and Plan: Ms. Spiers is a pleasant 50 y.o. 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  I have discussed the condition with the patient, including showing the radiographs and discussing treatment options in layman's terms. The patient may benefit from conservative management, however given the large herniations and her symptomology, she would likely benefit from a surgical decompression. Given her young age and her relative lack of degeneration, I think she would be a good candidate for C5-6, C6-7 cervical disc arthroplasty. We went into detail the difference between this and the traditional ACDF. She seemed to agree that this would make the most sense for her. I explained the risks, including the risk of infection, bleeding, need for further surgery, problems with swallowing and voice and the small possibility of neurological decline. She would like to proceed. We will plan on C5-7 arthroplasty in the near future.   Thank you for involving me in the care of this patient. I will keep you apprised of the patient's progress.  This note was partially dictated using voice recognition software, so please excuse any errors that were not corrected.  Nolon Nations Abd-El-Barr, MD     Addendum:  Patient's  symptomology has remained stable since time of original encounter (05/08/2016).  Plan is for C5-7 cervical arthroplasty.  Also: Heart examination:  Regular rate and rhythm. Lung examination:  Clear to auscultation bilaterally in all quadrants.

## 2016-06-11 NOTE — Anesthesia Procedure Notes (Signed)
Procedure Name: Intubation Date/Time: 06/11/2016 11:35 AM Performed by: Rosaria Ferries, Tedra Coppernoll Pre-anesthesia Checklist: Patient identified, Emergency Drugs available, Suction available and Patient being monitored Patient Re-evaluated:Patient Re-evaluated prior to inductionOxygen Delivery Method: Circle system utilized Preoxygenation: Pre-oxygenation with 100% oxygen Intubation Type: IV induction Laryngoscope Size: Mac and 3 Grade View: Grade I Tube type: Oral Tube size: 7.0 mm Number of attempts: 1 Placement Confirmation: ETT inserted through vocal cords under direct vision,  positive ETCO2 and breath sounds checked- equal and bilateral Secured at: 21 cm Tube secured with: Tape Dental Injury: Bloody posterior oropharynx

## 2016-06-11 NOTE — OR Nursing (Signed)
Pupils not dilated bilaterally - scopolamine patch applied behind right ear

## 2016-06-11 NOTE — Transfer of Care (Signed)
Immediate Anesthesia Transfer of Care Note  Patient: LAKRISTA HAGIE  Procedure(s) Performed: Procedure(s): CERVICAL ANTERIOR DISC ARTHROPLASTY CERVICAL 5-7 (Right)  Patient Location: PACU  Anesthesia Type:General  Level of Consciousness: awake and patient cooperative  Airway & Oxygen Therapy: Patient Spontanous Breathing and Patient connected to face mask oxygen  Post-op Assessment: Report given to RN and Post -op Vital signs reviewed and stable  Post vital signs: Reviewed and stable  Last Vitals:  Vitals:   06/11/16 0839 06/11/16 1800  BP: (!) 133/91 (!) 141/72  Pulse:  (!) 148  Resp: 16 16  Temp: (!) 36 C 37.6 C    Last Pain:  Vitals:   06/11/16 0839  TempSrc: Tympanic  PainSc: 5          Complications: No apparent anesthesia complications

## 2016-06-11 NOTE — Brief Op Note (Signed)
06/11/2016  5:40 PM  PATIENT:  Laura Barber  50 y.o. 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  PROCEDURE:  Procedure(s): CERVICAL ANTERIOR DISC ARTHROPLASTY CERVICAL 5-7 (Right)  SURGEON:  Surgeon(s) and Role:    * Blanche East, MD - Primary  PHYSICIAN ASSISTANT:   ASSISTANTS: none   ANESTHESIA:   general  EBL:  Total I/O In: 1900 [I.V.:1900] Out: 550 [Urine:500; Blood:50]  BLOOD ADMINISTERED:none  DRAINS: none   LOCAL MEDICATIONS USED:  LIDOCAINE   SPECIMEN:  No Specimen  DISPOSITION OF SPECIMEN:  N/A  COUNTS:  YES  TOURNIQUET:  * No tourniquets in log *  DICTATION: .Note written in EPIC  PLAN OF CARE: Admit for overnight observation  PATIENT DISPOSITION:  PACU - hemodynamically stable.   Delay start of Pharmacological VTE agent (>24hrs) due to surgical blood loss or risk of bleeding: no

## 2016-06-11 NOTE — Op Note (Signed)
PREOPERATIVE DIAGNOSIS:  Cervical stenosis  POSTOPERATIVE DIAGNOIS: Same  PROCEDURE:  C5-7 total cervical arthroplasty  SURGEON: Karenann Cai MD, PhD  INDICATIONS: Patient is a 50 y.o. Female with chief complaints of left upper extremity pain/numbness/weakness for several months. Cervical MRI revealed cervical herniated discs at C5-6, C6-7.  Patient had failed conservative measures.  After a long discussion with her, it was felt that she would benefit from a C5-7 arthroplasty.  It was decided that an arthroplasty would be better than a fusion at her young age to decrease the risk of adjacent segment disease.  Patient was made of the risks, including but not limited to infection, bleeding, need for further surgery, CSF leak, problems with voice and swallowing and neurological injury.  PROCEDURE: I met patient in the preoperative area where we confirmed the procedure and she signed an informed consent.  I marked the right side of her neck.  Patient was wheeled into the operative theater and shifted onto the OR bed where general anesthesia was commensed and entotracheal tube was placed.  A foley catheter was placed.  A shoulder roll was placed to put her neck in slight extension.  Her arms were tucked and all pressure points padded.  C-arm was rolled in an incision was designed centered on the C6 vertebral body.  MEPs, SSEPs and EMGs were obtained which were normal.  The patient was prepped and draped in a sterile fashion.  Local anesthesia was infiltrated into the skin.  A #10 blade was made to make a ~4 cm incision.  Bovie cautery was used to control bleeding.  Adson pickups were used to pick up the skin edges and the bovie was used to deepen the incision.  The plastyma was cut using Metzembaum scissors.  Debakeys and Jen Mow were used to deepen the dissection.   SCM was identified and the dissection was made medial to this.  Blunt disseciton was used to get on the spine and the prevertebral  fascia was dissected away.  Cloward retractors and peanuts were used to clean up the prevertebral fascia and a disc space was encountered.  C-arm was used to identify this as the C5-6 disc.  Further dissection was done to expose the C6-7 disc.  Shadowline retractors were placed centered on the C5-6 disc.  14 mm Caspar pins were placed in the C5 and C6 bodies and the distractor was placed.  The microscope was brought into the field.  A #15 blade was used to make an incision in the C5-6 disc.  Currettes and Kerrison rongeurs were used to remove the disc from uncinate process to uncinate process.  A small upgoing currette was used to get under the PLL and this was resected using upgoing currettes and Kerisson ronguers.  A nerve hook was used to make sure the foramen were open.  Copious antibiotic irrigation was then used.  A width gauge was then used to measure the width of the disc space.  An appropriate trial was hammered in and checked with fluoroscopy.  An appropriate implant was opened:  Mobi-C artificial disc.  This was then hammered into position.  There was some difficulty getting the artificial disc in.  Attention was then placed on the C6-7 disc space.  A #15 blade was used to make an incision in the C6-7 disc.  Currettes and Kerrison rongeurs were used to remove the disc from uncinate process to uncinate process.  A small upgoing currette was used to get under the PLL and this was  resected using upgoing currettes and Kerisson ronguers.  A nerve hook was used to make sure the foramen were open.  Copious antibiotic irrigation was then used.  A width gauge was then used to measure the width of the disc space.  An appropriate trial was hammered in and checked with fluoroscopy.  An appropriate implant was opened:  Mobi-C artificial disc.  This was then hammered into position.  This was easier than the first level.  Because of the difficulty in placing the C5-6 artificial disc, most likely due to not  distracting enough, it was decided to remove the artificial disc, distract more and place a new artifical disc.  This was done with minimal difficulty.    Caspar pins were removed and attention was made to hemostasis and closure.  The carotid and esophagus were inspected and appeared to be intact.  Copious antibiotic irrigation was used.  The platysma was closed with 3-0 vicryl sutures.  The subcutaneous tissue was closed with inverted 3-0 vicryl sutures. The skin was approximated with Steri-strips and a sterile dressing was applied.    Patient was then extubated and moving all extremities and transported to PACU in stable condition.  ESTIMATED BLOOD LOSS:  50 mL  COMPLICATIONS:  None  I performed the entire procedure.

## 2016-06-12 ENCOUNTER — Encounter: Payer: Self-pay | Admitting: Neurological Surgery

## 2016-06-12 DIAGNOSIS — Z8541 Personal history of malignant neoplasm of cervix uteri: Secondary | ICD-10-CM | POA: Diagnosis not present

## 2016-06-12 DIAGNOSIS — I10 Essential (primary) hypertension: Secondary | ICD-10-CM | POA: Diagnosis not present

## 2016-06-12 DIAGNOSIS — Z888 Allergy status to other drugs, medicaments and biological substances status: Secondary | ICD-10-CM | POA: Diagnosis not present

## 2016-06-12 DIAGNOSIS — K219 Gastro-esophageal reflux disease without esophagitis: Secondary | ICD-10-CM | POA: Diagnosis not present

## 2016-06-12 DIAGNOSIS — M50222 Other cervical disc displacement at C5-C6 level: Secondary | ICD-10-CM | POA: Diagnosis not present

## 2016-06-12 DIAGNOSIS — Z882 Allergy status to sulfonamides status: Secondary | ICD-10-CM | POA: Diagnosis not present

## 2016-06-12 DIAGNOSIS — E785 Hyperlipidemia, unspecified: Secondary | ICD-10-CM | POA: Diagnosis not present

## 2016-06-12 DIAGNOSIS — M4802 Spinal stenosis, cervical region: Secondary | ICD-10-CM | POA: Diagnosis not present

## 2016-06-12 DIAGNOSIS — F419 Anxiety disorder, unspecified: Secondary | ICD-10-CM | POA: Diagnosis not present

## 2016-06-12 DIAGNOSIS — Z90710 Acquired absence of both cervix and uterus: Secondary | ICD-10-CM | POA: Diagnosis not present

## 2016-06-12 MED ORDER — HYDROCODONE-ACETAMINOPHEN 5-325 MG PO TABS: 1.0000 | ORAL_TABLET | ORAL | 0 refills | Status: DC | PRN

## 2016-06-12 NOTE — Progress Notes (Signed)
PT Cancellation Note  Patient Details Name: Laura Barber MRN: [id number] DOB: 05/30/1966   Cancelled Treatment:    Reason Eval/Treat Not Completed: PT screened, no needs identified, will sign off (Consult received and chart reviewed.  Per primary RN, patient preparing for discharge; has been up indep in room without difficulty, no balance, strength or safety deficits identified.  Upon arrival to room, patient fully dressed (in heeled/wedge shoes) and up indep, preparing belongings for discharge.  Patient/husband verify that patient is at baseline level of functional ability; no PT needs at this time.  Initial order completed; please re-consult should needs change.  )   Livio Ledwith H. Owens Shark, PT, DPT, NCS 06/12/16, 9:38 AM [phone]

## 2016-06-12 NOTE — Plan of Care (Signed)
Problem: Safety: Goal: Ability to remain free from injury will improve Outcome: Progressing Pt will remain free from injury/falls during hospitalilzation

## 2016-06-12 NOTE — Progress Notes (Signed)
Patient is POD #1 from C5-7 cervical arthroplasty.  States that her left upper extremity pain mostly resolved.  Exam:  A+o x3 5/5 in bilateral biceps, delts, hand grip and iliopsoas Incision:  bandaid in place, with no problems.  A/P:  POD#1 c5-7 arthroplasty.  OK to discharge home.

## 2016-06-12 NOTE — Progress Notes (Signed)
Patient is ready for discharge. Patient instructions was explained to her and spouse. The both of them has understanding of the instructions.

## 2016-06-12 NOTE — Care Management (Signed)
Post discharge note: this patient discharged before [facility] could assess her discharge needs.

## 2016-06-12 NOTE — Discharge Instructions (Signed)
Anterior Cervical Diskectomy and Fusion °Anterior cervical diskectomy and fusion is a surgery that is done on the neck (cervical spine) to take pressure off of the nerves or the spinal cord. It is performed through the front (anterior) part of the neck. During this surgery, the damaged disk that is causing pain, numbness, or weakness is removed. The area where the disk was removed is filled with a plastic spacer implant, a bone graft, or both. These implants and bone grafts take pressure off of the nerves and spinal cord by making more room for the nerves to leave the spine. °LET YOUR HEALTH CARE PROVIDER KNOW ABOUT: °· Any allergies you have. °· All medicines you are taking, including vitamins, herbs, eye drops, creams, and over-the-counter medicines. °· Previous problems you or members of your family have had with the use of anesthetics. °· Any blood disorders you have. °· Previous surgeries you have had. °· Any medical conditions you may have. °RISKS AND COMPLICATIONS °Generally, this is a safe procedure. However, problems may occur, including: °· Infection. °· Bleeding with the possible need for blood transfusion. °· Injury to surrounding structures, including nerves. °· Leakage of fluid from the brain or spinal cord (cerebrospinal fluid). °· Blood clots. °· Temporary breathing difficulties after surgery. °BEFORE THE PROCEDURE °· Follow your health care provider's instructions about eating or drinking restrictions. °· Ask your health care provider about: °¨ Changing or stopping your regular medicines. This is especially important if you are taking diabetes medicines or blood thinners. °¨ Taking medicines such as aspirin and ibuprofen. These medicines can thin your blood. Do not take these medicines before your procedure if your health care provider instructs you not to. °· You may be given antibiotic medicines to help prevent infection. °· Your incision site may be marked on your neck. °PROCEDURE °· An IV tube  will be inserted into one of your veins. °· You will be given one or more of the following: °¨ A medicine that helps you relax (sedative). °¨ A medicine that makes you fall asleep (general anesthetic). °· A breathing tube will be placed. °· Your neck will be cleaned with a germ-killing solution (antiseptic). °· Your surgeon will make an incision on the front of your neck, usually within a skinfold line. °· Your neck muscles will be spread apart, and the damaged disk and bone spurs will be removed. °· The area where the disk was removed will be filled with a small plastic spacer implant, a bone graft, or both. °· Your surgeon may put metal plates and screws (hardware) in your neck. This helps to stabilize the surgical site and keep implants and bone grafts in place. The hardware reduces motion at the surgical site so your bones can grow together (fuse). This provides extra support to your neck. °· The incision will be closed with stitches (sutures). °· A bandage (dressing) will be applied to cover the incision. °The procedure may vary among health care providers and hospitals. °AFTER THE PROCEDURE °· Your blood pressure, heart rate, breathing rate, and blood oxygen level will be monitored often until the medicines you were given have worn off.  °· You will be monitored for any signs of complications from the procedure, such as: °¨ Too much bleeding from the incision site. °¨ A buildup of blood under your skin at the surgical site. °¨ Difficulty breathing. °· You may continue to receive antibiotics. °· You can start to eat as soon as you feel comfortable. °· You may be given   a neck brace to wear after surgery. This brace limits your neck movement while your bones are fusing together. Follow your health care provider's instructions about how often and how long you need to wear this. °  °This information is not intended to replace advice given to you by your health care provider. Make sure you discuss any questions you  have with your health care provider. °  °Document Released: 07/30/2009 Document Revised: 09/01/2014 Document Reviewed: 07/30/2009 °Elsevier Interactive Patient Education ©2016 [employer] ° ° ° °

## 2016-06-25 NOTE — Discharge Summary (Signed)
Patient is a 50 y.o. Female who complained of significant neck pai and bilateral upper extremity radiculopathy (left>right).  MRI showed significant cervical stenosis at C5-7.  Patient failed conservative therapy.  Patient underwent C5-7 total disc arthroplasty with Dr. Dava Najjar on 06/11/2016.  Procedure was uncomplicated.  Patient felt that her radiculopathy had improved since surgery.  Patient was discharged on POD 1.  She was full strength in her upper and lower extremities, was able to ambulate, void, eat and tolerating po pain medications.  Patient was discharged with adequate discharge instructions and a clinic follow up time.

## 2016-06-26 DIAGNOSIS — I10 Essential (primary) hypertension: Secondary | ICD-10-CM | POA: Diagnosis not present

## 2016-06-26 DIAGNOSIS — M4322 Fusion of spine, cervical region: Secondary | ICD-10-CM | POA: Diagnosis not present

## 2016-06-26 DIAGNOSIS — M5010 Cervical disc disorder with radiculopathy, unspecified cervical region: Secondary | ICD-10-CM | POA: Diagnosis not present

## 2016-07-24 DIAGNOSIS — E782 Mixed hyperlipidemia: Secondary | ICD-10-CM | POA: Diagnosis not present

## 2016-07-30 DIAGNOSIS — E782 Mixed hyperlipidemia: Secondary | ICD-10-CM | POA: Diagnosis not present

## 2016-07-30 DIAGNOSIS — I10 Essential (primary) hypertension: Secondary | ICD-10-CM | POA: Diagnosis not present

## 2016-07-30 DIAGNOSIS — F329 Major depressive disorder, single episode, unspecified: Secondary | ICD-10-CM | POA: Diagnosis not present

## 2016-07-30 DIAGNOSIS — F411 Generalized anxiety disorder: Secondary | ICD-10-CM | POA: Diagnosis not present

## 2016-08-14 DIAGNOSIS — M5010 Cervical disc disorder with radiculopathy, unspecified cervical region: Secondary | ICD-10-CM | POA: Diagnosis not present

## 2016-08-14 DIAGNOSIS — M47812 Spondylosis without myelopathy or radiculopathy, cervical region: Secondary | ICD-10-CM | POA: Diagnosis not present

## 2016-08-29 DIAGNOSIS — A630 Anogenital (venereal) warts: Secondary | ICD-10-CM | POA: Diagnosis not present

## 2016-08-29 DIAGNOSIS — Z8541 Personal history of malignant neoplasm of cervix uteri: Secondary | ICD-10-CM | POA: Diagnosis not present

## 2016-08-29 DIAGNOSIS — Z01419 Encounter for gynecological examination (general) (routine) without abnormal findings: Secondary | ICD-10-CM | POA: Diagnosis not present

## 2016-09-05 ENCOUNTER — Other Ambulatory Visit: Payer: Self-pay | Admitting: Certified Nurse Midwife

## 2016-09-05 DIAGNOSIS — Z1231 Encounter for screening mammogram for malignant neoplasm of breast: Secondary | ICD-10-CM

## 2016-10-02 ENCOUNTER — Ambulatory Visit
Admission: RE | Admit: 2016-10-02 | Discharge: 2016-10-02 | Disposition: A | Discharge status: Home / Self Care | Payer: BLUE CROSS/BLUE SHIELD | Source: Ambulatory Visit | Attending: Certified Nurse Midwife | Admitting: Certified Nurse Midwife

## 2016-10-02 DIAGNOSIS — Z1231 Encounter for screening mammogram for malignant neoplasm of breast: Secondary | ICD-10-CM | POA: Insufficient documentation

## 2016-10-08 ENCOUNTER — Other Ambulatory Visit: Payer: Self-pay | Admitting: *Deleted

## 2016-10-08 ENCOUNTER — Inpatient Hospital Stay
Admission: RE | Admit: 2016-10-08 | Discharge: 2016-10-08 | Disposition: A | Discharge status: Home / Self Care | Payer: Self-pay | Source: Ambulatory Visit | Attending: *Deleted | Admitting: *Deleted

## 2016-10-08 DIAGNOSIS — Z9289 Personal history of other medical treatment: Secondary | ICD-10-CM

## 2016-10-13 ENCOUNTER — Other Ambulatory Visit: Payer: Self-pay | Admitting: Certified Nurse Midwife

## 2016-10-13 DIAGNOSIS — N6320 Unspecified lump in the left breast, unspecified quadrant: Secondary | ICD-10-CM

## 2016-10-13 DIAGNOSIS — R928 Other abnormal and inconclusive findings on diagnostic imaging of breast: Secondary | ICD-10-CM

## 2016-10-22 ENCOUNTER — Ambulatory Visit
Admission: RE | Admit: 2016-10-22 | Discharge: 2016-10-22 | Disposition: A | Discharge status: Home / Self Care | Payer: BLUE CROSS/BLUE SHIELD | Source: Ambulatory Visit | Attending: Certified Nurse Midwife | Admitting: Certified Nurse Midwife

## 2016-10-22 DIAGNOSIS — N6489 Other specified disorders of breast: Secondary | ICD-10-CM | POA: Diagnosis not present

## 2016-10-22 DIAGNOSIS — R928 Other abnormal and inconclusive findings on diagnostic imaging of breast: Secondary | ICD-10-CM

## 2016-10-22 DIAGNOSIS — N6320 Unspecified lump in the left breast, unspecified quadrant: Secondary | ICD-10-CM

## 2016-10-27 ENCOUNTER — Telehealth: Payer: Self-pay

## 2016-10-27 NOTE — Telephone Encounter (Addendum)
Pt calling to see if [facility] would rx her hormone for menopause since everything c breast is okay.  Pt uses [facility] in [place]. Pt # [phone] or c# [phone].

## 2016-10-28 NOTE — Telephone Encounter (Signed)
Talked with patient about starting ET patch and documented it in [place].

## 2016-11-24 DIAGNOSIS — M5010 Cervical disc disorder with radiculopathy, unspecified cervical region: Secondary | ICD-10-CM | POA: Diagnosis not present

## 2016-11-24 DIAGNOSIS — M9941 Connective tissue stenosis of neural canal of cervical region: Secondary | ICD-10-CM | POA: Diagnosis not present

## 2016-11-24 DIAGNOSIS — Z9889 Other specified postprocedural states: Secondary | ICD-10-CM | POA: Diagnosis not present

## 2017-01-14 ENCOUNTER — Encounter: Payer: Self-pay | Admitting: Certified Nurse Midwife

## 2017-01-14 ENCOUNTER — Other Ambulatory Visit: Payer: Self-pay | Admitting: Certified Nurse Midwife

## 2017-01-14 ENCOUNTER — Ambulatory Visit (INDEPENDENT_AMBULATORY_CARE_PROVIDER_SITE_OTHER): Payer: BLUE CROSS/BLUE SHIELD | Admitting: Certified Nurse Midwife

## 2017-01-14 VITALS — BP 108/68 | HR 70 | Ht 70.0 in | Wt 179.0 lb

## 2017-01-14 DIAGNOSIS — C539 Malignant neoplasm of cervix uteri, unspecified: Secondary | ICD-10-CM | POA: Insufficient documentation

## 2017-01-14 DIAGNOSIS — N951 Menopausal and female climacteric states: Secondary | ICD-10-CM

## 2017-01-14 DIAGNOSIS — Z79890 Hormone replacement therapy (postmenopausal): Secondary | ICD-10-CM | POA: Diagnosis not present

## 2017-01-14 MED ORDER — ESTRADIOL 0.0375 MG/24HR TD PTTW: 1.0000 | MEDICATED_PATCH | TRANSDERMAL | 3 refills | Status: DC

## 2017-01-14 NOTE — Progress Notes (Signed)
  History of Present Illness:  Laura Barber is a 51 y.o. who was started on Vivelle Dot patches 0.0375 mgm  approximately 2 months ago. Since that time, she states that her symptoms are improving. Hot flashes and  night sweats have been significantly reduced. She is no longer wakening multiple times during the night with vasomotor symptoms. Her mood has improved also  PMHx: She  has a past medical history of Anxiety; Breast mass; Cervical cancer, FIGO stage IB1 ([place]) (2005); GERD (gastroesophageal reflux disease); Headache; Hyperlipidemia; Hypertension; and Migraine. Also,  has a past surgical history that includes Abdominal hysterectomy (2005); Nasal sinus surgery; Cervical disc arthroplasty (Right, 06/11/2016); Breast cyst aspiration (Left, 05/30/2013); LEEP (2005); Dilation and curettage of uterus; and Diagnostic laparoscopy (2005)., family history includes Diabetes in her father; Hyperlipidemia in her mother; Hypertension in her father; Lung cancer in her mother.,  reports that she has never smoked. She has never used smokeless tobacco. She reports that she drinks alcohol. She reports that she does not use drugs.  She has a current medication list which includes the following prescription(s): desonide, rizatriptan, valacyclovir, valsartan-hydrochlorothiazide, atorvastatin, cetirizine, estradiol, multivitamin, naproxen sodium, and venlafaxine xr. Also, is allergic to claritin-d 12 hour [loratadine-pseudoephedrine er] and sulfa antibiotics.  ROS  Physical Exam:  BP 108/68   Pulse 70   Ht 5\' 10"  (1.778 m)   Wt 179 lb (81.2 kg)   BMI 25.68 kg/m  Body mass index is 25.68 kg/m. Constitutional: Well nourished, well developed female in no acute distress.  Neuro: Grossly intact Psych:  Normal mood and affect.    Assessment: Medication treatment is going well for her vasomotor symptoms.  Plan: She will undergo no change in her medical therapy. Refills of Vivelle Dot 0.0375 patches sent to  pharmacy  She was amenable to this plan and we will see her back in 3 mos for follow up  Dalia Heading, [facility], [facility] 01/14/2017  1:31 PM

## 2017-01-29 DIAGNOSIS — F329 Major depressive disorder, single episode, unspecified: Secondary | ICD-10-CM | POA: Diagnosis not present

## 2017-01-29 DIAGNOSIS — I10 Essential (primary) hypertension: Secondary | ICD-10-CM | POA: Diagnosis not present

## 2017-01-29 DIAGNOSIS — G43019 Migraine without aura, intractable, without status migrainosus: Secondary | ICD-10-CM | POA: Diagnosis not present

## 2017-01-29 DIAGNOSIS — E782 Mixed hyperlipidemia: Secondary | ICD-10-CM | POA: Diagnosis not present

## 2017-04-11 ENCOUNTER — Other Ambulatory Visit: Payer: Self-pay | Admitting: Certified Nurse Midwife

## 2017-04-11 MED ORDER — ESTRADIOL 0.0375 MG/24HR TD PTTW: 1.0000 | MEDICATED_PATCH | TRANSDERMAL | 6 refills | Status: DC

## 2017-04-16 ENCOUNTER — Ambulatory Visit: Payer: BLUE CROSS/BLUE SHIELD | Admitting: Certified Nurse Midwife

## 2017-04-17 ENCOUNTER — Ambulatory Visit (INDEPENDENT_AMBULATORY_CARE_PROVIDER_SITE_OTHER): Payer: BLUE CROSS/BLUE SHIELD | Admitting: Certified Nurse Midwife

## 2017-04-17 ENCOUNTER — Encounter: Payer: Self-pay | Admitting: Certified Nurse Midwife

## 2017-04-17 VITALS — BP 118/78 | HR 75 | Ht 70.0 in | Wt 183.0 lb

## 2017-04-17 DIAGNOSIS — Z79890 Hormone replacement therapy (postmenopausal): Secondary | ICD-10-CM

## 2017-04-17 DIAGNOSIS — N951 Menopausal and female climacteric states: Secondary | ICD-10-CM | POA: Diagnosis not present

## 2017-04-17 MED ORDER — ESTRADIOL 0.0375 MG/24HR TD PTTW
1.0000 | MEDICATED_PATCH | TRANSDERMAL | 2 refills | Status: DC
Start: 2017-04-20 — End: 2018-05-14

## 2017-04-19 NOTE — Progress Notes (Signed)
  History of Present Illness:  Laura Barber is a 51 y.o. who was started on Vivelle patches 0.0375 mgm approximately 5 months ago. Since that time, she states that her symptoms of hot flashes and mood swings have resolved on her current dose.Marland Kitchen  PMHx: She  has a past medical history of Anxiety; Breast mass; Cervical cancer, FIGO stage IB1 ([place]) (2005); GERD (gastroesophageal reflux disease); Headache; Hyperlipidemia; Hypertension; and Migraine. Also,  has a past surgical history that includes Abdominal hysterectomy (2005); Nasal sinus surgery; Cervical disc arthroplasty (Right, 06/11/2016); Breast cyst aspiration (Left, 05/30/2013); LEEP (2005); Dilation and curettage of uterus; and Diagnostic laparoscopy (2005)., family history includes Diabetes in her father; Hyperlipidemia in her mother; Hypertension in her father; Lung cancer in her mother.,  reports that she has never smoked. She has never used smokeless tobacco. She reports that she drinks alcohol. She reports that she does not use drugs.  She has a current medication list which includes the following prescription(s): atorvastatin, cetirizine, desonide, estradiol, multivitamin, naproxen sodium, rizatriptan, valacyclovir, valsartan-hydrochlorothiazide, and venlafaxine xr. Also, is allergic to claritin-d 12 hour [loratadine-pseudoephedrine er] and sulfa antibiotics.  ROS  Physical Exam:  BP 118/78   Pulse 75   Ht 5\' 10"  (1.778 m)   Wt 183 lb (83 kg)   BMI 26.26 kg/m  Body mass index is 26.26 kg/m. Constitutional: Well nourished, well developed female in no acute distress.  Neuro: Grossly intact Psych:  Normal mood and affect.    Assessment: Medication treatment is going very well for her menopausal symptoms.  Plan: She will continue on her current dose of Vivelle 0.0375 patches  She was amenable to this plan and we will see her back for annual/PRN.  Laura Barber L. Danise Mina, CNM [facility], [facility] 04/19/2017   6:19 PM

## 2017-04-23 DIAGNOSIS — L578 Other skin changes due to chronic exposure to nonionizing radiation: Secondary | ICD-10-CM | POA: Diagnosis not present

## 2017-04-23 DIAGNOSIS — Z872 Personal history of diseases of the skin and subcutaneous tissue: Secondary | ICD-10-CM | POA: Diagnosis not present

## 2017-04-23 DIAGNOSIS — Z86018 Personal history of other benign neoplasm: Secondary | ICD-10-CM | POA: Diagnosis not present

## 2017-05-29 DIAGNOSIS — I10 Essential (primary) hypertension: Secondary | ICD-10-CM | POA: Diagnosis not present

## 2017-05-29 DIAGNOSIS — F411 Generalized anxiety disorder: Secondary | ICD-10-CM | POA: Diagnosis not present

## 2017-05-29 DIAGNOSIS — G43019 Migraine without aura, intractable, without status migrainosus: Secondary | ICD-10-CM | POA: Diagnosis not present

## 2017-07-27 DIAGNOSIS — Z9889 Other specified postprocedural states: Secondary | ICD-10-CM | POA: Diagnosis not present

## 2017-08-25 DIAGNOSIS — B029 Zoster without complications: Secondary | ICD-10-CM

## 2017-08-25 HISTORY — DX: Zoster without complications: B02.9

## 2017-09-29 ENCOUNTER — Ambulatory Visit: Payer: Self-pay | Admitting: Nurse Practitioner

## 2017-10-30 ENCOUNTER — Other Ambulatory Visit: Payer: Self-pay

## 2017-10-30 MED ORDER — ATORVASTATIN CALCIUM 10 MG PO TABS: 10.0000 mg | ORAL_TABLET | Freq: Every day | ORAL | 5 refills | Status: DC

## 2017-11-02 ENCOUNTER — Other Ambulatory Visit: Payer: Self-pay | Admitting: Certified Nurse Midwife

## 2017-11-02 DIAGNOSIS — Z1231 Encounter for screening mammogram for malignant neoplasm of breast: Secondary | ICD-10-CM

## 2017-11-03 ENCOUNTER — Encounter: Payer: Self-pay | Admitting: Certified Nurse Midwife

## 2017-11-03 ENCOUNTER — Ambulatory Visit: Payer: BLUE CROSS/BLUE SHIELD | Admitting: Certified Nurse Midwife

## 2017-11-03 VITALS — BP 104/66 | HR 75 | Ht 70.0 in | Wt 183.0 lb

## 2017-11-03 DIAGNOSIS — Z1272 Encounter for screening for malignant neoplasm of vagina: Secondary | ICD-10-CM | POA: Diagnosis not present

## 2017-11-03 DIAGNOSIS — Z1211 Encounter for screening for malignant neoplasm of colon: Secondary | ICD-10-CM

## 2017-11-03 DIAGNOSIS — Z01419 Encounter for gynecological examination (general) (routine) without abnormal findings: Secondary | ICD-10-CM

## 2017-11-03 DIAGNOSIS — C539 Malignant neoplasm of cervix uteri, unspecified: Secondary | ICD-10-CM

## 2017-11-03 NOTE — Progress Notes (Signed)
Gynecology Annual Exam  PCP: Lavera Guise, MD  Chief Complaint:  Chief Complaint  Patient presents with  . Follow-up    medication follow up  . Gynecologic Exam    History of Present Illness:Laura Barber is a 52 year old Caucasian/White female, G1 P0010, who presents for her annual exam. She is not having any significant gyn problems. Her Vivelle Dot 0/0375 patch has greatly reduced her hot flashes, night sweats, and mood swings since her last annual exam.  Her menses are absent due to a radical hysterectomy for invasive cervical cancer in 2005  She has had no spotting.   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  Since her last annual GYN exam dated 08/29/2016, she has had She is sexually active.  Her most recent pap smear was obtained 08/29/2016 and was NIL with negative HRHPV. The previous Pap smears in 2017 returned NIL/positive HRHPV and in  05/2014 returned with LGSIL and positive HRHPV. Dr Laurey Morale did a colpo in 2015 which was negative and she was also seen by Dr Clarene Essex who also did a colposcopy in 2015 which was also negative.   Her most recent mammogram obtained on 10/02/2016 was Birads 0. Additional views and ultrasound 10/22/2016 revealed a cyst in the left breast and the mammogram was then Birads 2. She has another mammogram scheduled for 11/17/2017. There is no family history of breast cancer.  There is no family history of ovarian cancer.  The patient does do occasional self breast exams.  The patient does not smoke.  The patient does drink alcohol occasionally.  The patient does not use illegal drugs.  The patient exercises regularly since starting a [employer] 4 weeks ago. The patient does not get adequate calcium in her diet.  She had a recent cholesterol screen by PCP at [facility] in 2018 that  was borderline.   The patient denies current symptoms of depression.    Review of Systems: Review of Systems  Constitutional: Negative for chills, fever and weight loss.  HENT: Negative for congestion, sinus pain and sore throat.   Eyes: Negative for blurred vision and pain.  Respiratory: Negative for hemoptysis, shortness of breath and wheezing.   Cardiovascular: Negative for chest pain, palpitations and leg swelling.  Gastrointestinal: Negative for abdominal pain, blood in stool, diarrhea, heartburn, nausea and vomiting.  Genitourinary: Negative for dysuria, frequency, hematuria and urgency.  Musculoskeletal: Negative for back pain, joint pain and myalgias.  Skin: Negative for itching and rash.  Neurological: Negative for dizziness, tingling and headaches.  Endo/Heme/Allergies: Positive for environmental allergies (sneezing and coughing). Negative for polydipsia. Does not bruise/bleed easily.       Negative for hirsutism   Psychiatric/Behavioral: Negative for depression. The patient is not nervous/anxious and does not have insomnia.     Past Medical History:  Past Medical History:  Diagnosis Date  . Anxiety   . Breast mass    left breast cyst  . Cervical cancer, FIGO stage IB1 ([place]) 2005   Cervical  . GERD (gastroesophageal reflux disease)   . Headache   . Hyperlipidemia   . Hypertension   . Migraine     Past Surgical History:  Past Surgical History:  Procedure Laterality Date  . ABDOMINAL HYSTERECTOMY  2005   Radical hysterectomy by Dr Clarene Essex at [facility] cervical cancer  1B1  . BREAST CYST ASPIRATION Left 05/30/2013  . CERVICAL DISC ARTHROPLASTY Right 06/11/2016   Procedure: CERVICAL ANTERIOR [place] ARTHROPLASTY CERVICAL 5-7;  Surgeon: Blanche East, MD;  Location: [facility] ORS;  Service: Neurosurgery;  Laterality: Right;  . DIAGNOSTIC LAPAROSCOPY  2005  . DILATION AND CURETTAGE OF UTERUS     SAB  . 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    Marland Kitchen NASAL SINUS SURGERY      Family History:  Family History  Problem Relation Age of Onset  . Lung cancer Mother   . Hyperlipidemia Mother   . Diabetes Father   . Hypertension Father   . Hyperlipidemia Brother   . Breast cancer Neg Hx     Social History:  Social History   Socioeconomic History  . Marital status: Divorced    Spouse name: Not on file  . Number of children: 0  . Years of education: Not on file  . Highest education level: Not on file  Social Needs  . Financial resource strain: Not on file  . Food insecurity - worry: Not on file  . Food insecurity - inability: Not on file  . Transportation needs - medical: Not on file  . Transportation needs - non-medical: Not on file  Occupational History  . Not on file  Tobacco Use  . Smoking status: Never Smoker  . Smokeless tobacco: Never Used  Substance and Sexual Activity  . Alcohol use: Yes    Comment: occassional  . Drug use: No  . Sexual activity: Yes    Partners: Male    Birth control/protection: Surgical  Other Topics Concern  . Not on file  Social History Narrative  . Not on file    Allergies:  Allergies  Allergen Reactions  . Claritin-D 12 Hour [Loratadine-Pseudoephedrine Er] Itching and Swelling  . Sulfa Antibiotics Itching and Swelling    Medications: Prior to Admission medications   Medication Sig Start Date End Date Taking? Authorizing Provider  atorvastatin (LIPITOR) 10 MG tablet Take 1 tablet (10 mg total) by mouth daily at 6 PM. 10/30/17  Yes Lavera Guise, MD  cetirizine (ZYRTEC) 10 MG tablet Take 10 mg by mouth daily.   Yes [provider]  desonide (DESOWEN) 0.05 % cream  06/28/16  Yes [provider]  estradiol (VIVELLE-DOT) 0.0375 MG/24HR Place 1 patch onto the skin 2 (two) times a week. 04/20/17  Yes Dalia Heading, CNM  Multiple Vitamin (MULTIVITAMIN) tablet Take 1 tablet by mouth daily.   Yes [provider]  naproxen sodium (ANAPROX) 550 MG tablet   12/25/16  Yes [provider]  rizatriptan (MAXALT) 5 MG tablet Take by mouth. 03/06/14  Yes [provider]  valACYclovir (VALTREX) 1000 MG tablet Take by mouth. 03/24/14  Yes [provider]  valsartan-hydrochlorothiazide (DIOVAN-HCT) 80-12.5 MG tablet Take by mouth. 04/11/14  Yes [provider]  venlafaxine XR (EFFEXOR-XR) 37.5 MG 24 hr capsule Take 37.5 mg by mouth daily with breakfast.  05/16/13  Yes [provider]    Physical Exam Vitals: BP 104/66   Pulse 75   Ht 5\' 10"  (1.778 m)   Wt 183 lb (83 kg)   BMI 26.26 kg/m   General:WF in  NAD HEENT: normocephalic, anicteric Neck: no thyroid enlargement, no palpable nodules, no cervical lymphadenopathy  Pulmonary: No increased work of breathing, wheezes in the right lower lobe Cardiovascular: RRR, without murmur  Breast: Breast symmetrical, no tenderness, no palpable nodules or masses, no  skin or nipple retraction present, no nipple discharge.  No axillary, infraclavicular or supraclavicular lymphadenopathy. Abdomen: Soft, non-tender, non-distended.  Umbilicus without lesions.  No hepatomegaly or masses palpable. No evidence of hernia. Genitourinary:  External: Normal external female genitalia.  Normal urethral meatus, normal Bartholin's and Skene's glands.    Vagina: Normal vaginal mucosa, no evidence of prolapse.    Cervix: surgically absent  Uterus: surgically absent  Adnexa: No adnexal masses, non-tender  Rectal: deferred  Lymphatic: no evidence of inguinal lymphadenopathy Extremities: no edema, erythema, or tenderness Neurologic: Grossly intact Psychiatric: mood appropriate, affect full     Assessment: 53 y.o. G1P0010 annual gyn exam  H/O TAH for cervical cancer Menopausal symptoms relieved with ET.  Plan:   1) Breast cancer screening - recommend monthly self breast exam and annual screening mammograms. Mammogram is scheduled for 11/17/2017  2)Refill of Vivelle Dot 0.0375 #8/RF  x11  3) Vaginal cancer screening - Pap was done. ASCCP guidelines and rational discussed.  Patient opts for yearly screening interval due to history of cervical cancer  4)Colon cancer screening. Discussed options and patient prefers referral to GI for colonoscopy.  5) Routine healthcare maintenance including cholesterol and diabetes screening managed by PCP. Discussed calcium and vitamin D3 requirements and the role of exercise in preventing osteoporosis  6) RTO in 1 year and prn.  Dalia Heading, CNM

## 2017-11-06 LAB — IGP, APTIMA HPV
HPV Aptima: NEGATIVE
PAP SMEAR COMMENT: 0

## 2017-11-09 ENCOUNTER — Encounter: Payer: Self-pay | Admitting: Certified Nurse Midwife

## 2017-11-17 ENCOUNTER — Ambulatory Visit
Admission: RE | Admit: 2017-11-17 | Discharge: 2017-11-17 | Disposition: A | Discharge status: Home / Self Care | Payer: BLUE CROSS/BLUE SHIELD | Source: Ambulatory Visit | Attending: Certified Nurse Midwife | Admitting: Certified Nurse Midwife

## 2017-11-17 DIAGNOSIS — Z1231 Encounter for screening mammogram for malignant neoplasm of breast: Secondary | ICD-10-CM | POA: Insufficient documentation

## 2017-11-24 DIAGNOSIS — Z1211 Encounter for screening for malignant neoplasm of colon: Secondary | ICD-10-CM | POA: Diagnosis not present

## 2017-11-24 DIAGNOSIS — Z8371 Family history of colonic polyps: Secondary | ICD-10-CM | POA: Diagnosis not present

## 2017-11-24 DIAGNOSIS — Z01818 Encounter for other preprocedural examination: Secondary | ICD-10-CM | POA: Diagnosis not present

## 2017-12-03 ENCOUNTER — Telehealth: Payer: Self-pay | Admitting: Internal Medicine

## 2017-12-03 MED ORDER — VALSARTAN-HYDROCHLOROTHIAZIDE 80-12.5 MG PO TABS: 1.0000 | ORAL_TABLET | Freq: Every day | ORAL | 5 refills | Status: DC

## 2017-12-03 NOTE — Telephone Encounter (Signed)
Refill medication

## 2018-01-01 ENCOUNTER — Other Ambulatory Visit: Payer: Self-pay

## 2018-01-01 MED ORDER — VENLAFAXINE HCL ER 37.5 MG PO CP24: 37.5000 mg | ORAL_CAPSULE | Freq: Every day | ORAL | 3 refills | Status: DC

## 2018-01-14 ENCOUNTER — Other Ambulatory Visit: Payer: Self-pay | Admitting: Nurse Practitioner

## 2018-01-14 ENCOUNTER — Telehealth: Payer: Self-pay | Admitting: Nurse Practitioner

## 2018-01-14 DIAGNOSIS — F411 Generalized anxiety disorder: Secondary | ICD-10-CM

## 2018-01-14 MED ORDER — ALPRAZOLAM 0.5 MG PO TABS: 0.5000 mg | ORAL_TABLET | Freq: Every evening | ORAL | 1 refills | Status: DC | PRN

## 2018-01-14 NOTE — Progress Notes (Signed)
Approved alprazolam 0.5mg  QHS prn with #30 and 1 refill. Sent to [facility]. She will need to be seen for further refills.

## 2018-01-14 NOTE — Telephone Encounter (Signed)
Approved alprazolam 0.5mg  QHS prn with #30 and 1 refill. Sent to [facility]. She will need to be seen for further refills.

## 2018-01-15 NOTE — Telephone Encounter (Signed)
Spoke with pt and informed her that rx was refilled for 30 days with 1 refill but need to be seen for further refills per Leretha Pol.

## 2018-02-04 DIAGNOSIS — M222X1 Patellofemoral disorders, right knee: Secondary | ICD-10-CM | POA: Diagnosis not present

## 2018-02-04 DIAGNOSIS — M25561 Pain in right knee: Secondary | ICD-10-CM | POA: Diagnosis not present

## 2018-02-04 DIAGNOSIS — G8929 Other chronic pain: Secondary | ICD-10-CM | POA: Diagnosis not present

## 2018-02-04 DIAGNOSIS — M7631 Iliotibial band syndrome, right leg: Secondary | ICD-10-CM | POA: Diagnosis not present

## 2018-03-26 ENCOUNTER — Encounter: Payer: Self-pay | Admitting: Adult Health

## 2018-03-26 ENCOUNTER — Ambulatory Visit: Payer: BLUE CROSS/BLUE SHIELD | Admitting: Adult Health

## 2018-03-26 VITALS — BP 130/72 | HR 70 | Temp 98.1°F | Resp 16 | Ht 70.0 in | Wt 185.6 lb

## 2018-03-26 DIAGNOSIS — Z0001 Encounter for general adult medical examination with abnormal findings: Secondary | ICD-10-CM

## 2018-03-26 DIAGNOSIS — R002 Palpitations: Secondary | ICD-10-CM

## 2018-03-26 DIAGNOSIS — K219 Gastro-esophageal reflux disease without esophagitis: Secondary | ICD-10-CM

## 2018-03-26 DIAGNOSIS — R1013 Epigastric pain: Secondary | ICD-10-CM | POA: Diagnosis not present

## 2018-03-26 MED ORDER — OMEPRAZOLE 20 MG PO CPDR: 20.0000 mg | DELAYED_RELEASE_CAPSULE | Freq: Every day | ORAL | 3 refills | Status: AC

## 2018-03-26 NOTE — Progress Notes (Signed)
[facility] [place], [place] [address]  Internal MEDICINE  Office Visit Note  Patient Name: Laura Barber  [id number]  [id number]  Date of Service: 03/26/2018  Chief Complaint  Patient presents with  . Dizziness    Heart flutters on and off happening more frequently      HPI Pt is here for a sick visit.  She reports that for a few weeks she has noticed palpitations.  She reports this fluttering feeling in her chest.  She reports that she notices it the most when she lays down at night.  She also reports some dizziness with standing intermittently.  She reports some epigastric pain, she describes "like indigestion" that moves through her and into her back.  She is taking OTC Prilosec with some relief.      Current Medication:  Outpatient Encounter Medications as of 03/26/2018  Medication Sig  . ALPRAZolam (XANAX) 0.5 MG tablet Take 1 tablet (0.5 mg total) by mouth at bedtime as needed for anxiety.  Marland Kitchen atorvastatin (LIPITOR) 10 MG tablet Take 1 tablet (10 mg total) by mouth daily at 6 PM.  . cetirizine (ZYRTEC) 10 MG tablet Take 10 mg by mouth daily.  Marland Kitchen desonide (DESOWEN) 0.05 % cream   . estradiol (VIVELLE-DOT) 0.0375 MG/24HR Place 1 patch onto the skin 2 (two) times a week.  . Multiple Vitamin (MULTIVITAMIN) tablet Take 1 tablet by mouth daily.  . naproxen sodium (ANAPROX) 550 MG tablet   . rizatriptan (MAXALT) 5 MG tablet Take by mouth.  . valACYclovir (VALTREX) 1000 MG tablet Take by mouth.  . valsartan-hydrochlorothiazide (DIOVAN-HCT) 80-12.5 MG tablet Take 1 tablet by mouth daily.  Marland Kitchen venlafaxine XR (EFFEXOR-XR) 37.5 MG 24 hr capsule Take 1 capsule (37.5 mg total) by mouth daily with breakfast.   No facility-administered encounter medications on file as of 03/26/2018.       Medical History: Past Medical History:  Diagnosis Date  . Anxiety   . Breast mass    left breast cyst  . Cervical cancer, FIGO stage IB1 ([place]) 2005   Cervical  . GERD  (gastroesophageal reflux disease)   . Headache   . Hyperlipidemia   . Hypertension   . Migraine      Vital Signs: BP 130/72   Pulse 70   Temp 98.1 F (36.7 C)   Resp 16   Ht 5\' 10"  (1.778 m)   Wt 185 lb 9.6 oz (84.2 kg)   SpO2 99%   BMI 26.63 kg/m    Review of Systems  Constitutional: Negative for chills, fatigue and unexpected weight change.  HENT: Negative for congestion, rhinorrhea, sneezing and sore throat.   Eyes: Negative for photophobia, pain and redness.  Respiratory: Negative for cough, chest tightness and shortness of breath.   Cardiovascular: Negative for chest pain and palpitations.  Gastrointestinal: Negative for abdominal pain, constipation, diarrhea, nausea and vomiting.  Endocrine: Negative.   Genitourinary: Negative for dysuria and frequency.  Musculoskeletal: Negative for arthralgias, back pain, joint swelling and neck pain.  Skin: Negative for rash.  Allergic/Immunologic: Negative.   Neurological: Negative for tremors and numbness.  Hematological: Negative for adenopathy. Does not bruise/bleed easily.  Psychiatric/Behavioral: Negative for behavioral problems and sleep disturbance. The patient is not nervous/anxious.     Physical Exam  Constitutional: She is oriented to person, place, and time. She appears well-developed and well-nourished. No distress.  HENT:  Head: Normocephalic and atraumatic.  Mouth/Throat: Oropharynx is clear and moist. No oropharyngeal exudate.  Eyes: Pupils are equal, round, and reactive to light. EOM are normal.  Neck: Normal range of motion. Neck supple. No JVD present. No tracheal deviation present. No thyromegaly present.  Cardiovascular: Normal rate, regular rhythm and normal heart sounds. Exam reveals no gallop and no friction rub.  No murmur heard. Pulmonary/Chest: Effort normal and breath sounds normal. No respiratory distress. She has no wheezes. She has no rales. She exhibits no tenderness.  Abdominal: Soft. There is  no tenderness. There is no guarding.  Musculoskeletal: Normal range of motion.  Lymphadenopathy:    She has no cervical adenopathy.  Neurological: She is alert and oriented to person, place, and time. No cranial nerve deficit.  Skin: Skin is warm and dry. She is not diaphoretic.  Psychiatric: She has a normal mood and affect. Her behavior is normal. Judgment and thought content normal.  Nursing note and vitals reviewed.   Assessment/Plan: 1. Palpitations Will follow up with Holter data, and ECHO.  - HOLTER MONITOR - 24 HOUR; Future - ECHOCARDIOGRAM COMPLETE; Future - EKG 12-Lead - CBC with Differential/Platelet - Lipid Panel With LDL/HDL Ratio - TSH - T4, free - Comprehensive metabolic panel Have labs drawn, for upcoming visit.  Referral to Cardiology placed.  EKG, my interpretation.  NSR.  Questionable old event. No acute ST elevation.  2. Epigastric abdominal pain Rule out Gallbladder issues with ABD [place].  - US Abdomen Complete; Future  3. Gastroesophageal reflux disease without esophagitis Pt reporting some relief with OTC Prilosec, RX given.  - omeprazole (PRILOSEC) 20 MG capsule; Take 1 capsule (20 mg total) by mouth daily.  Dispense: 30 capsule; Refill: 3   General Counseling: [name] verbalizes understanding of the findings of todays visit and agrees with plan of treatment. I have discussed any further diagnostic evaluation that may be needed or ordered today. We also reviewed her medications today. she has been encouraged to call the office with any questions or concerns that should arise related to todays visit.   No orders of the defined types were placed in this encounter.   No orders of the defined types were placed in this encounter.   Time spent: 25 Minutes  This patient was seen by Orson Gear AGNP-C in Collaboration with Dr Lavera Guise as a part of collaborative care agreement

## 2018-03-26 NOTE — Patient Instructions (Signed)
Food Choices for Gastroesophageal Reflux Disease, Adult When you have gastroesophageal reflux disease (GERD), the foods you eat and your eating habits are very important. Choosing the right foods can help ease your discomfort. What guidelines do I need to follow?  Choose fruits, vegetables, whole grains, and low-fat dairy products.  Choose low-fat meat, fish, and poultry.  Limit fats such as oils, salad dressings, butter, nuts, and avocado.  Keep a food diary. This helps you identify foods that cause symptoms.  Avoid foods that cause symptoms. These may be different for everyone.  Eat small meals often instead of 3 large meals a day.  Eat your meals slowly, in a place where you are relaxed.  Limit fried foods.  Cook foods using methods other than frying.  Avoid drinking alcohol.  Avoid drinking large amounts of liquids with your meals.  Avoid bending over or lying down until 2-3 hours after eating. What foods are not recommended? These are some foods and drinks that may make your symptoms worse: Vegetables  Tomatoes. Tomato juice. Tomato and spaghetti sauce. Chili peppers. Onion and garlic. Horseradish. Fruits  Oranges, grapefruit, and lemon (fruit and juice). Meats  High-fat meats, fish, and poultry. This includes hot dogs, ribs, ham, sausage, salami, and bacon. Dairy  Whole milk and chocolate milk. Sour cream. Cream. Butter. Ice cream. Cream cheese. Drinks  Coffee and tea. Bubbly (carbonated) drinks or energy drinks. Condiments  Hot sauce. Barbecue sauce. Sweets/Desserts  Chocolate and cocoa. Donuts. Peppermint and spearmint. Fats and Oils  High-fat foods. This includes French fries and potato chips. Other  Vinegar. Strong spices. This includes black pepper, white pepper, red pepper, cayenne, curry powder, cloves, ginger, and chili powder. The items listed above may not be a complete list of foods and drinks to avoid. Contact your dietitian for more information.    This information is not intended to replace advice given to you by your health care provider. Make sure you discuss any questions you have with your health care provider. Document Released: 02/10/2012 Document Revised: 01/17/2016 Document Reviewed: 06/15/2013 Elsevier Interactive Patient Education  2017 Elsevier Inc.  

## 2018-03-27 ENCOUNTER — Encounter: Payer: Self-pay | Admitting: Gynecology

## 2018-03-27 ENCOUNTER — Other Ambulatory Visit: Payer: Self-pay

## 2018-03-27 ENCOUNTER — Ambulatory Visit
Admission: EM | Admit: 2018-03-27 | Discharge: 2018-03-27 | Disposition: A | Discharge status: Home / Self Care | Payer: BLUE CROSS/BLUE SHIELD | Attending: Family Medicine | Admitting: Family Medicine

## 2018-03-27 DIAGNOSIS — B029 Zoster without complications: Secondary | ICD-10-CM

## 2018-03-27 MED ORDER — HYDROCODONE-ACETAMINOPHEN 5-325 MG PO TABS: 1.0000 | ORAL_TABLET | Freq: Three times a day (TID) | ORAL | 0 refills | Status: DC | PRN

## 2018-03-27 MED ORDER — VALACYCLOVIR HCL 1 G PO TABS: 1000.0000 mg | ORAL_TABLET | Freq: Three times a day (TID) | ORAL | 0 refills | Status: DC

## 2018-03-27 NOTE — ED Provider Notes (Signed)
[facility]    CSN: [id number] Arrival date & time: 03/27/18  0809  History   Chief Complaint Chief Complaint  Patient presents with  . Rash   HPI  52 year old female presents with rash.  Patient reports she developed pain on Monday or Tuesday.  Pain has continued and is severe, currently 8/10 in severity.  Yesterday she developed 2 areas of rash.  Patient reports a blisterlike rash around her bra line.  One area is located on the lateral aspect and the other posteriorly.  Mild redness.  No drainage.  No fevers or chills.  No medications or interventions tried.  No other associated symptoms.  No other complaints.  Past Medical History:  Diagnosis Date  . Anxiety   . Breast mass    left breast cyst  . Cervical cancer, FIGO stage IB1 ([place]) 2005   Cervical  . GERD (gastroesophageal reflux disease)   . Headache   . Hyperlipidemia   . Hypertension   . Migraine    Patient Active Problem List   Diagnosis Date Noted  . Cervical cancer, FIGO stage IB1 ([place]) 01/14/2017  . Cervical stenosis of spine 06/11/2016  . Lump or mass in breast 05/30/2013   Past Surgical History:  Procedure Laterality Date  . ABDOMINAL HYSTERECTOMY  2005   Radical hysterectomy by Dr Clarene Essex at [facility] cervical cancer 1B1  . BREAST CYST ASPIRATION Left 05/30/2013  . CERVICAL DISC ARTHROPLASTY Right 06/11/2016   Procedure: CERVICAL ANTERIOR [place] ARTHROPLASTY CERVICAL 5-7;  Surgeon: Blanche East, MD;  Location: [facility] ORS;  Service: Neurosurgery;  Laterality: Right;  . DIAGNOSTIC LAPAROSCOPY  2005  . DILATION AND CURETTAGE OF UTERUS     SAB  . 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   Marland Kitchen NASAL SINUS SURGERY     OB History    Gravida  1   Para      Term      Preterm      AB  1   Living  0     SAB  1   TAB      Ectopic      Multiple      Live Births           Obstetric Comments  Menstrual age: 10  Age 1st Pregnancy: N/A       Home  Medications    Prior to Admission medications   Medication Sig Start Date End Date Taking? Authorizing Provider  ALPRAZolam Duanne Moron) 0.5 MG tablet Take 1 tablet (0.5 mg total) by mouth at bedtime as needed for anxiety. 01/14/18  Yes Ronnell Freshwater, NP  atorvastatin (LIPITOR) 10 MG tablet Take 1 tablet (10 mg total) by mouth daily at 6 PM. 10/30/17  Yes Lavera Guise, MD  cetirizine (ZYRTEC) 10 MG tablet Take 10 mg by mouth daily.   Yes [provider]  desonide (DESOWEN) 0.05 % cream  06/28/16  Yes [provider]  estradiol (VIVELLE-DOT) 0.0375 MG/24HR Place 1 patch onto the skin 2 (two) times a week. 04/20/17  Yes Dalia Heading, CNM  Multiple Vitamin (MULTIVITAMIN) tablet Take 1 tablet by mouth daily.   Yes [provider]  naproxen sodium (ANAPROX) 550 MG tablet  12/25/16  Yes [provider]  omeprazole (PRILOSEC) 20 MG capsule Take 1 capsule (20 mg total) by mouth daily. 03/26/18  Yes Scarboro, Audie Clear, NP  valsartan-hydrochlorothiazide (DIOVAN-HCT) 80-12.5 MG tablet Take 1 tablet by mouth daily. 12/03/17  Yes  Lavera Guise, MD  venlafaxine XR (EFFEXOR-XR) 37.5 MG 24 hr capsule Take 1 capsule (37.5 mg total) by mouth daily with breakfast. 01/01/18  Yes Boscia, Heather E, NP  HYDROcodone-acetaminophen (NORCO/VICODIN) 5-325 MG tablet Take 1 tablet by mouth every 8 (eight) hours as needed. 03/27/18   Coral Spikes, DO  rizatriptan (MAXALT) 5 MG tablet Take by mouth. 03/06/14   [provider]  valACYclovir (VALTREX) 1000 MG tablet Take 1 tablet (1,000 mg total) by mouth 3 (three) times daily. 03/27/18   Coral Spikes, DO    Family History Family History  Problem Relation Age of Onset  . Lung cancer Mother   . Hyperlipidemia Mother   . Diabetes Father   . Hypertension Father   . Hyperlipidemia Brother   . Breast cancer Neg Hx     Social History Social History   Tobacco Use  . Smoking status: Never Smoker  . Smokeless tobacco: Never Used    Substance Use Topics  . Alcohol use: Yes    Comment: occassional  . Drug use: No     Allergies   Claritin-d 12 hour [loratadine-pseudoephedrine er] and Sulfa antibiotics   Review of Systems Review of Systems  Constitutional: Negative.   Skin: Positive for rash.   Physical Exam Triage Vital Signs ED Triage Vitals  Enc Vitals Group     BP 03/27/18 0820 127/65     Pulse Rate 03/27/18 0820 66     Resp 03/27/18 0820 16     Temp 03/27/18 0820 98.7 F (37.1 C)     Temp Source 03/27/18 0820 Oral     SpO2 03/27/18 0820 99 %     Weight 03/27/18 0820 185 lb (83.9 kg)     Height 03/27/18 0820 5\' 10"  (1.778 m)     Head Circumference --      Peak Flow --      Pain Score 03/27/18 0847 7     Pain Loc --      Pain Edu? --      Excl. in [place]? --    Updated Vital Signs BP 127/65 (BP Location: Left Arm)   Pulse 66   Temp 98.7 F (37.1 C) (Oral)   Resp 16   Ht 5\' 10"  (1.778 m)   Wt 185 lb (83.9 kg)   SpO2 99%   BMI 26.54 kg/m   Visual Acuity Right Eye Distance:   Left Eye Distance:   Bilateral Distance:    Right Eye Near:   Left Eye Near:    Bilateral Near:     Physical Exam  Constitutional: She is oriented to person, place, and time. She appears well-developed. No distress.  HENT:  Head: Normocephalic and atraumatic.  Eyes: Conjunctivae are normal. Right eye exhibits no discharge. Left eye exhibits no discharge.  Pulmonary/Chest: Effort normal. No respiratory distress.  Neurological: She is alert and oriented to person, place, and time.  Skin:  Patient has 2 small areas of erythematous, vesicular rash located on the right side (bra line).  Psychiatric: She has a normal mood and affect. Her behavior is normal.  Nursing note and vitals reviewed.  UC Treatments / Results  Labs (all labs ordered are listed, but only abnormal results are displayed) Labs Reviewed - No data to display  EKG None  Radiology No results found.  Procedures Procedures (including  critical care time)  Medications Ordered in UC Medications - No data to display  Initial Impression / Assessment and Plan / UC  Course  I have reviewed the triage vital signs and the nursing notes.  Pertinent labs & imaging results that were available during my care of the patient were reviewed by me and considered in my medical decision making (see chart for details).    52 year old female presents with herpes zoster.  Treating with Valtrex.  Vicodin for pain.  [place] controlled substance database reviewed.  No concerns for abuse.  Final Clinical Impressions(s) / UC Diagnoses   Final diagnoses:  Herpes zoster without complication   Discharge Instructions   None    ED Prescriptions    Medication Sig Dispense Auth. Provider   valACYclovir (VALTREX) 1000 MG tablet Take 1 tablet (1,000 mg total) by mouth 3 (three) times daily. 21 tablet Hazem Kenner G, DO   HYDROcodone-acetaminophen (NORCO/VICODIN) 5-325 MG tablet Take 1 tablet by mouth every 8 (eight) hours as needed. 15 tablet Coral Spikes, DO     Controlled Substance Prescriptions [place] Controlled Substance Registry consulted? Yes, I have consulted the [place] Controlled Substances Registry for this patient, and feel the risk/benefit ratio today is favorable for proceeding with this prescription for a controlled substance.   Thersa Salt [place], [place] 03/27/18 [phone]

## 2018-03-27 NOTE — ED Triage Notes (Signed)
Patient c/o rash at upper extremity. Per patient rash burn / painful.

## 2018-03-29 DIAGNOSIS — Z0001 Encounter for general adult medical examination with abnormal findings: Secondary | ICD-10-CM | POA: Diagnosis not present

## 2018-03-30 ENCOUNTER — Encounter: Payer: Self-pay | Admitting: Adult Health

## 2018-03-30 ENCOUNTER — Telehealth: Payer: Self-pay

## 2018-03-30 LAB — CBC WITH DIFFERENTIAL/PLATELET
Basophils Absolute: 0 10*3/uL (ref 0.0–0.2)
Basos: 1 %
EOS (ABSOLUTE): 0.2 10*3/uL (ref 0.0–0.4)
Eos: 4 %
Hematocrit: 40.4 % (ref 34.0–46.6)
Hemoglobin: 14.3 g/dL (ref 11.1–15.9)
Immature Grans (Abs): 0 10*3/uL (ref 0.0–0.1)
Immature Granulocytes: 0 %
Lymphocytes Absolute: 1.2 10*3/uL (ref 0.7–3.1)
Lymphs: 20 %
MCH: 31.8 pg (ref 26.6–33.0)
MCHC: 35.4 g/dL (ref 31.5–35.7)
MCV: 90 fL (ref 79–97)
Monocytes Absolute: 0.6 10*3/uL (ref 0.1–0.9)
Monocytes: 10 %
Neutrophils Absolute: 3.9 10*3/uL (ref 1.4–7.0)
Neutrophils: 65 %
Platelets: 186 10*3/uL (ref 150–450)
RBC: 4.5 x10E6/uL (ref 3.77–5.28)
RDW: 13.4 % (ref 12.3–15.4)
WBC: 5.9 10*3/uL (ref 3.4–10.8)

## 2018-03-30 LAB — LIPID PANEL WITH LDL/HDL RATIO
Cholesterol, Total: 196 mg/dL (ref 100–199)
HDL: 64 mg/dL (ref >39)
LDL Calculated: 103 mg/dL — ABNORMAL HIGH (ref 0–99)
LDl/HDL Ratio: 1.6 ratio (ref 0.0–3.2)
Triglycerides: 147 mg/dL (ref 0–149)
VLDL Cholesterol Cal: 29 mg/dL (ref 5–40)

## 2018-03-30 LAB — COMPREHENSIVE METABOLIC PANEL
A/G RATIO: 2.4 — AB (ref 1.2–2.2)
ALT: 21 IU/L (ref 0–32)
AST: 20 IU/L (ref 0–40)
Albumin: 4.7 g/dL (ref 3.5–5.5)
Alkaline Phosphatase: 50 IU/L (ref 39–117)
BUN/Creatinine Ratio: 22 (ref 9–23)
BUN: 21 mg/dL (ref 6–24)
Bilirubin Total: 0.4 mg/dL (ref 0.0–1.2)
CO2: 26 mmol/L (ref 20–29)
CREATININE: 0.97 mg/dL (ref 0.57–1.00)
Calcium: 9.1 mg/dL (ref 8.7–10.2)
Chloride: 100 mmol/L (ref 96–106)
GFR, EST AFRICAN AMERICAN: 78 mL/min/{1.73_m2} (ref >59)
GFR, EST NON AFRICAN AMERICAN: 68 mL/min/{1.73_m2} (ref >59)
GLOBULIN, TOTAL: 2 g/dL (ref 1.5–4.5)
GLUCOSE: 85 mg/dL (ref 65–99)
POTASSIUM: 4.6 mmol/L (ref 3.5–5.2)
SODIUM: 140 mmol/L (ref 134–144)
Total Protein: 6.7 g/dL (ref 6.0–8.5)

## 2018-03-30 LAB — T4, FREE: Free T4: 1.01 ng/dL (ref 0.82–1.77)

## 2018-03-30 LAB — TSH: TSH: 2.2 u[IU]/mL (ref 0.450–4.500)

## 2018-03-30 NOTE — Telephone Encounter (Signed)
Patient has been advised of holter monitor results. Laura Barber

## 2018-03-30 NOTE — Progress Notes (Signed)
SCANNED IN HOLTER REPORT FROM 03/26/18.

## 2018-03-31 ENCOUNTER — Encounter: Payer: Self-pay | Admitting: Adult Health

## 2018-04-02 ENCOUNTER — Ambulatory Visit (INDEPENDENT_AMBULATORY_CARE_PROVIDER_SITE_OTHER): Payer: BLUE CROSS/BLUE SHIELD

## 2018-04-02 ENCOUNTER — Other Ambulatory Visit: Payer: Self-pay

## 2018-04-02 DIAGNOSIS — R1013 Epigastric pain: Secondary | ICD-10-CM | POA: Diagnosis not present

## 2018-04-05 ENCOUNTER — Ambulatory Visit: Payer: BLUE CROSS/BLUE SHIELD | Admitting: Adult Health

## 2018-04-05 ENCOUNTER — Encounter: Payer: Self-pay | Admitting: Adult Health

## 2018-04-05 VITALS — BP 132/82 | HR 75 | Resp 16 | Ht 70.0 in | Wt 184.4 lb

## 2018-04-05 DIAGNOSIS — R1013 Epigastric pain: Secondary | ICD-10-CM

## 2018-04-05 DIAGNOSIS — R002 Palpitations: Secondary | ICD-10-CM

## 2018-04-05 DIAGNOSIS — K219 Gastro-esophageal reflux disease without esophagitis: Secondary | ICD-10-CM | POA: Diagnosis not present

## 2018-04-05 NOTE — Patient Instructions (Signed)
Palpitations A palpitation is the feeling that your heart:  Has an uneven (irregular) heartbeat.  Is beating faster than normal.  Is fluttering.  Is skipping a beat.  This is usually not a serious problem. In some cases, you may need more medical tests. Follow these instructions at home:  Avoid: ? Caffeine in coffee, tea, soft drinks, diet pills, and energy drinks. ? Chocolate. ? Alcohol.  Do not use any tobacco products. These include cigarettes, chewing tobacco, and e-cigarettes. If you need help quitting, ask your doctor.  Try to reduce your stress. These things may help: ? Yoga. ? Meditation. ? Physical activity. Swimming, jogging, and walking are good choices. ? A method that helps you use your mind to control things in your body, like heartbeats (biofeedback).  Get plenty of rest and sleep.  Take over-the-counter and prescription medicines only as told by your doctor.  Keep all follow-up visits as told by your doctor. This is important. Contact a doctor if:  Your heartbeat is still fast or uneven after 24 hours.  Your palpitations occur more often. Get help right away if:  You have chest pain.  You feel short of breath.  You have a very bad headache.  You feel dizzy.  You pass out (faint). This information is not intended to replace advice given to you by your health care provider. Make sure you discuss any questions you have with your health care provider. Document Released: 05/20/2008 Document Revised: 01/17/2016 Document Reviewed: 04/26/2015 Elsevier Interactive Patient Education  2018 Elsevier Inc.  

## 2018-04-05 NOTE — Progress Notes (Signed)
[facility] [place], [place] [address]  Internal MEDICINE  Office Visit Note  Patient Name: Laura Barber  [id number]  [id number]  Date of Service: 04/14/2018  Chief Complaint  Patient presents with  . Palpitations    follow up holter monitor     HPI Pt here for follow up from palpitations and Holter monitor review. She reports she broke out with Shingles right after her visit to the office.  She is healed now, and reports her heart palpitations have continued intermittently.  She sees cardiology in 5 days.  She reports feeling fine, and denies SOB, or chest pain.     Current Medication: Outpatient Encounter Medications as of 04/05/2018  Medication Sig  . ALPRAZolam (XANAX) 0.5 MG tablet Take 1 tablet (0.5 mg total) by mouth at bedtime as needed for anxiety.  Marland Kitchen atorvastatin (LIPITOR) 10 MG tablet Take 1 tablet (10 mg total) by mouth daily at 6 PM.  . cetirizine (ZYRTEC) 10 MG tablet Take 10 mg by mouth daily.  Marland Kitchen desonide (DESOWEN) 0.05 % cream   . estradiol (VIVELLE-DOT) 0.0375 MG/24HR Place 1 patch onto the skin 2 (two) times a week.  . Multiple Vitamin (MULTIVITAMIN) tablet Take 1 tablet by mouth daily.  . naproxen sodium (ANAPROX) 550 MG tablet   . omeprazole (PRILOSEC) 20 MG capsule Take 1 capsule (20 mg total) by mouth daily.  . rizatriptan (MAXALT) 5 MG tablet Take by mouth.  . valsartan-hydrochlorothiazide (DIOVAN-HCT) 80-12.5 MG tablet Take 1 tablet by mouth daily.  Marland Kitchen venlafaxine XR (EFFEXOR-XR) 37.5 MG 24 hr capsule Take 1 capsule (37.5 mg total) by mouth daily with breakfast.  . [DISCONTINUED] HYDROcodone-acetaminophen (NORCO/VICODIN) 5-325 MG tablet Take 1 tablet by mouth every 8 (eight) hours as needed. (Patient not taking: Reported on 04/09/2018)  . [DISCONTINUED] valACYclovir (VALTREX) 1000 MG tablet Take 1 tablet (1,000 mg total) by mouth 3 (three) times daily. (Patient not taking: Reported on 04/09/2018)   No facility-administered  encounter medications on file as of 04/05/2018.     Surgical History: Past Surgical History:  Procedure Laterality Date  . ABDOMINAL HYSTERECTOMY  2005   Radical hysterectomy by Dr Clarene Essex at [facility] cervical cancer 1B1  . BREAST CYST ASPIRATION Left 05/30/2013  . CERVICAL DISC ARTHROPLASTY Right 06/11/2016   Procedure: CERVICAL ANTERIOR [place] ARTHROPLASTY CERVICAL 5-7;  Surgeon: Blanche East, MD;  Location: [facility] ORS;  Service: Neurosurgery;  Laterality: Right;  . DIAGNOSTIC LAPAROSCOPY  2005  . DILATION AND CURETTAGE OF UTERUS     SAB  . 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   Marland Kitchen NASAL SINUS SURGERY      Medical History: Past Medical History:  Diagnosis Date  . Anxiety   . Breast mass    left breast cyst  . Cancer (HCC)    Hx cervical cancer  . Cervical cancer, FIGO stage IB1 ([place]) 2005   Cervical  . GERD (gastroesophageal reflux disease)   . Headache   . Hyperlipidemia   . Hypertension   . Migraine     Family History: Family History  Problem Relation Age of Onset  . Lung cancer Mother   . Hyperlipidemia Mother   . Diabetes Father   . Hypertension Father   . Heart failure Father   . Heart attack Father   . Heart disease Father   . Hyperlipidemia Brother   . Breast cancer Neg Hx     Social History   Socioeconomic History  .  Marital status: Divorced    Spouse name: Not on file  . Number of children: 0  . Years of education: Not on file  . Highest education level: Not on file  Occupational History  . Not on file  Social Needs  . Financial resource strain: Not on file  . Food insecurity:    Worry: Not on file    Inability: Not on file  . Transportation needs:    Medical: Not on file    Non-medical: Not on file  Tobacco Use  . Smoking status: Never Smoker  . Smokeless tobacco: Never Used  Substance and Sexual Activity  . Alcohol use: Yes    Comment: occassional  . Drug use: No  . Sexual activity: Yes     Partners: Male    Birth control/protection: Surgical  Lifestyle  . Physical activity:    Days per week: Not on file    Minutes per session: Not on file  . Stress: Not on file  Relationships  . Social connections:    Talks on phone: Not on file    Gets together: Not on file    Attends religious service: Not on file    Active member of club or organization: Not on file    Attends meetings of clubs or organizations: Not on file    Relationship status: Not on file  . Intimate partner violence:    Fear of current or ex partner: Not on file    Emotionally abused: Not on file    Physically abused: Not on file    Forced sexual activity: Not on file  Other Topics Concern  . Not on file  Social History Narrative  . Not on file    Review of Systems  Constitutional: Negative for chills, fatigue and unexpected weight change.  HENT: Negative for congestion, rhinorrhea, sneezing and sore throat.   Eyes: Negative for photophobia, pain and redness.  Respiratory: Negative for cough, chest tightness and shortness of breath.   Cardiovascular: Negative for chest pain and palpitations.  Gastrointestinal: Negative for abdominal pain, constipation, diarrhea, nausea and vomiting.  Endocrine: Negative.   Genitourinary: Negative for dysuria and frequency.  Musculoskeletal: Negative for arthralgias, back pain, joint swelling and neck pain.  Skin: Negative for rash.  Allergic/Immunologic: Negative.   Neurological: Negative for tremors and numbness.  Hematological: Negative for adenopathy. Does not bruise/bleed easily.  Psychiatric/Behavioral: Negative for behavioral problems and sleep disturbance. The patient is not nervous/anxious.     Vital Signs: BP 132/82   Pulse 75   Resp 16   Ht 5\' 10"  (1.778 m)   Wt 184 lb 6.4 oz (83.6 kg)   SpO2 99%   BMI 26.46 kg/m    Physical Exam  Constitutional: She is oriented to person, place, and time. She appears well-developed and well-nourished. No  distress.  HENT:  Head: Normocephalic and atraumatic.  Mouth/Throat: Oropharynx is clear and moist. No oropharyngeal exudate.  Eyes: Pupils are equal, round, and reactive to light. EOM are normal.  Neck: Normal range of motion. Neck supple. No JVD present. No tracheal deviation present. No thyromegaly present.  Cardiovascular: Normal rate, regular rhythm and normal heart sounds. Exam reveals no gallop and no friction rub.  No murmur heard. Pulmonary/Chest: Effort normal and breath sounds normal. No respiratory distress. She has no wheezes. She has no rales. She exhibits no tenderness.  Abdominal: Soft. There is no tenderness. There is no guarding.  Musculoskeletal: Normal range of motion.  Lymphadenopathy:  She has no cervical adenopathy.  Neurological: She is alert and oriented to person, place, and time. No cranial nerve deficit.  Skin: Skin is warm and dry. She is not diaphoretic.  Psychiatric: She has a normal mood and affect. Her behavior is normal. Judgment and thought content normal.  Nursing note and vitals reviewed.   Assessment/Plan: 1. Palpitations Pt continues to have these episodes. Follow up with Cardiology on Friday as scheduled. Holter reviewed with the pt   2. Gastroesophageal reflux disease without esophagitis Pt on omeprazole, and reports good relief with this medication.  Continue therapy for now.   3. Epigastric abdominal pain No significant episodes since last visit.   General Counseling: ambreen tufte understanding of the findings of todays visit and agrees with plan of treatment. I have discussed any further diagnostic evaluation that may be needed or ordered today. We also reviewed her medications today. she has been encouraged to call the office with any questions or concerns that should arise related to todays visit.   Time spent: 25 Minutes   This patient was seen by Orson Gear AGNP-C in Collaboration with Dr Lavera Guise as a part of  collaborative care agreement    Dr Lavera Guise Internal medicine

## 2018-04-09 ENCOUNTER — Ambulatory Visit: Payer: BLUE CROSS/BLUE SHIELD | Admitting: Cardiovascular Disease

## 2018-04-09 ENCOUNTER — Encounter: Payer: Self-pay | Admitting: Cardiovascular Disease

## 2018-04-09 ENCOUNTER — Other Ambulatory Visit: Payer: Self-pay

## 2018-04-09 VITALS — BP 114/77 | HR 76 | Ht 70.0 in | Wt 184.2 lb

## 2018-04-09 DIAGNOSIS — R0602 Shortness of breath: Secondary | ICD-10-CM

## 2018-04-09 DIAGNOSIS — I493 Ventricular premature depolarization: Secondary | ICD-10-CM

## 2018-04-09 DIAGNOSIS — I10 Essential (primary) hypertension: Secondary | ICD-10-CM

## 2018-04-09 NOTE — Progress Notes (Signed)
Cardiology Office Note   Date:  04/09/2018   ID:  Laura Barber, DOB 1966-05-25, MRN [id number]  PCP:  Lavera Guise, MD  Cardiologist:   Kathlyn Sacramento, MD   Chief Complaint  Patient presents with  . other    Heart palpitations c/o occassional cramping chest discomfort and dizziness. Meds reviewed verbally with pt.      History of Present Illness: Laura Barber is a 52 y.o. female who was referred by Orson Gear for evaluation of palpitations and PVCs.  The patient has no prior cardiac history.  She has known history of essential hypertension controlled with medications, hyperlipidemia, anxiety and GERD.  She is a lifelong non-smoker.  Family history is remarkable for coronary artery disease but not prematurely.  There is no family history of sudden death. The patient started having palpitations over the last few months described as fluttering sensation mostly at rest and most noticeable at night when she is trying to sleep.  This is usually associated with mild shortness of breath but no chest pain, dizziness, syncope or presyncope.  She had a 48-hour Holter monitor and she was noted to have PVCs.  It was described as she had 2400 PVC and 48 hours.  However, I reviewed the monitor myself and there is a lot of motion artifact that probably interfered with the count.  I suspect that her PVC burden is much less than that. She exercises regularly and attends a [facility] with no significant exertional symptoms.  She does not notice these palpitations during exercise.   Past Medical History:  Diagnosis Date  . Anxiety   . Breast mass    left breast cyst  . Cancer (HCC)    Hx cervical cancer  . Cervical cancer, FIGO stage IB1 ([place]) 2005   Cervical  . GERD (gastroesophageal reflux disease)   . Headache   . Hyperlipidemia   . Hypertension   . Migraine     Past Surgical History:  Procedure Laterality Date  . ABDOMINAL HYSTERECTOMY  2005   Radical hysterectomy by Dr  Clarene Essex at [facility] cervical cancer 1B1  . BREAST CYST ASPIRATION Left 05/30/2013  . CERVICAL DISC ARTHROPLASTY Right 06/11/2016   Procedure: CERVICAL ANTERIOR [place] ARTHROPLASTY CERVICAL 5-7;  Surgeon: Blanche East, MD;  Location: [facility] ORS;  Service: Neurosurgery;  Laterality: Right;  . DIAGNOSTIC LAPAROSCOPY  2005  . DILATION AND CURETTAGE OF UTERUS     SAB  . 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   Marland Kitchen NASAL SINUS SURGERY       Current Outpatient Medications  Medication Sig Dispense Refill  . ALPRAZolam (XANAX) 0.5 MG tablet Take 1 tablet (0.5 mg total) by mouth at bedtime as needed for anxiety. 30 tablet 1  . atorvastatin (LIPITOR) 10 MG tablet Take 1 tablet (10 mg total) by mouth daily at 6 PM. 30 tablet 5  . cetirizine (ZYRTEC) 10 MG tablet Take 10 mg by mouth daily.    Marland Kitchen desonide (DESOWEN) 0.05 % cream     . estradiol (VIVELLE-DOT) 0.0375 MG/24HR Place 1 patch onto the skin 2 (two) times a week. 24 patch 2  . Multiple Vitamin (MULTIVITAMIN) tablet Take 1 tablet by mouth daily.    . naproxen sodium (ANAPROX) 550 MG tablet   2  . omeprazole (PRILOSEC) 20 MG capsule Take 1 capsule (20 mg total) by mouth daily. 30 capsule 3  . rizatriptan (MAXALT) 5 MG tablet Take by mouth.    Marland Kitchen  valACYclovir (VALTREX) 1000 MG tablet Take 1,000 mg by mouth as needed.    . valsartan-hydrochlorothiazide (DIOVAN-HCT) 80-12.5 MG tablet Take 1 tablet by mouth daily. 30 tablet 5  . venlafaxine XR (EFFEXOR-XR) 37.5 MG 24 hr capsule Take 1 capsule (37.5 mg total) by mouth daily with breakfast. 30 capsule 3   No current facility-administered medications for this visit.     Allergies:   Claritin-d 12 hour [loratadine-pseudoephedrine er] and Sulfa antibiotics    Social History:  The patient  reports that she has never smoked. She has never used smokeless tobacco. She reports that she drinks alcohol. She reports that she does not use drugs.   Family History:  The patient's  family history includes Diabetes in her father; Heart attack in her father; Heart disease in her father; Heart failure in her father; Hyperlipidemia in her brother and mother; Hypertension in her father; Lung cancer in her mother.    ROS:  Please see the history of present illness.   Otherwise, review of systems are positive for none.   All other systems are reviewed and negative.    PHYSICAL EXAM: VS:  BP 114/77 (BP Location: Right Arm, Patient Position: Sitting, Cuff Size: Normal)   Pulse 76   Ht 5\' 10"  (1.778 m)   Wt 184 lb 4 oz (83.6 kg)   BMI 26.44 kg/m  , BMI Body mass index is 26.44 kg/m. GEN: Well nourished, well developed, in no acute distress  HEENT: normal  Neck: no JVD, carotid bruits, or masses Cardiac: RRR; no murmurs, rubs, or gallops,no edema  Respiratory:  clear to auscultation bilaterally, normal work of breathing GI: soft, nontender, nondistended, + BS MS: no deformity or atrophy  Skin: warm and dry, no rash Neuro:  Strength and sensation are intact Psych: euthymic mood, full affect   EKG:  EKG is ordered today. The ekg ordered today demonstrates normal sinus rhythm with no significant ST or T wave changes.   Recent Labs: 03/29/2018: ALT 21; BUN 21; Creatinine, Ser 0.97; Hemoglobin 14.3; Platelets 186; Potassium 4.6; Sodium 140; TSH 2.200    Lipid Panel    Component Value Date/Time   CHOL 196 03/29/2018 0807   TRIG 147 03/29/2018 0807   HDL 64 03/29/2018 0807   LDLCALC 103 (H) 03/29/2018 0807      Wt Readings from Last 3 Encounters:  04/09/18 184 lb 4 oz (83.6 kg)  04/05/18 184 lb 6.4 oz (83.6 kg)  03/27/18 185 lb (83.9 kg)       PAD Screen 04/09/2018  Previous PAD dx? No  Previous surgical procedure? No  Pain with walking? No  Feet/toe relief with dangling? No  Painful, non-healing ulcers? No  Extremities discolored? No      ASSESSMENT AND PLAN:  1.  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  However, the burden of PVC  seems to be low overall.  She does not consume excessive amount of caffeine.  She has no exertional limitations.  Given the associated shortness of breath during these episodes, I will obtain an echocardiogram to ensure no structural heart abnormalities. If EF is normal with no significant structural heart disease, no treatment is needed for this given that her symptoms are overall mild.  2.  Essential hypertension: Blood pressures controlled on current medications.    Disposition:   FU with me as needed  Signed,  Kathlyn Sacramento, MD  04/09/2018 10:27 AM    [place]

## 2018-04-09 NOTE — Patient Instructions (Signed)
Medication Instructions: Your physician recommends that you continue on your current medications as directed. Please refer to the Current Medication list given to you today.  If you need a refill on your cardiac medications before your next appointment, please call your pharmacy.   Procedures/Testing: Your physician has requested that you have an echocardiogram. Echocardiography is a painless test that uses sound waves to create images of your heart. It provides your doctor with information about the size and shape of your heart and how well your heart's chambers and valves are working. You may receive an ultrasound enhancing agent through an IV if needed to better visualize your heart during the echo.This procedure takes approximately one hour. There are no restrictions for this procedure. This will take place at the [facility] clinic.    Follow-Up: Your physician wants you to follow-up as needed with Dr. Fletcher Anon.  Thank you for choosing [facility]!

## 2018-04-15 ENCOUNTER — Other Ambulatory Visit: Payer: BLUE CROSS/BLUE SHIELD

## 2018-04-16 DIAGNOSIS — Z1211 Encounter for screening for malignant neoplasm of colon: Secondary | ICD-10-CM | POA: Diagnosis not present

## 2018-04-16 DIAGNOSIS — Z8371 Family history of colonic polyps: Secondary | ICD-10-CM | POA: Diagnosis not present

## 2018-04-16 LAB — HM COLONOSCOPY

## 2018-04-20 ENCOUNTER — Ambulatory Visit (INDEPENDENT_AMBULATORY_CARE_PROVIDER_SITE_OTHER): Payer: BLUE CROSS/BLUE SHIELD

## 2018-04-20 ENCOUNTER — Other Ambulatory Visit: Payer: Self-pay

## 2018-04-20 DIAGNOSIS — R0602 Shortness of breath: Secondary | ICD-10-CM

## 2018-04-20 DIAGNOSIS — I493 Ventricular premature depolarization: Secondary | ICD-10-CM | POA: Diagnosis not present

## 2018-05-03 ENCOUNTER — Encounter: Payer: Self-pay | Admitting: Adult Health

## 2018-05-03 ENCOUNTER — Ambulatory Visit: Payer: BLUE CROSS/BLUE SHIELD | Admitting: Adult Health

## 2018-05-03 VITALS — BP 120/82 | HR 73 | Resp 16 | Ht 70.0 in | Wt 184.4 lb

## 2018-05-03 DIAGNOSIS — E785 Hyperlipidemia, unspecified: Secondary | ICD-10-CM

## 2018-05-03 DIAGNOSIS — K219 Gastro-esophageal reflux disease without esophagitis: Secondary | ICD-10-CM

## 2018-05-03 DIAGNOSIS — I10 Essential (primary) hypertension: Secondary | ICD-10-CM | POA: Diagnosis not present

## 2018-05-03 DIAGNOSIS — R002 Palpitations: Secondary | ICD-10-CM

## 2018-05-03 DIAGNOSIS — R1013 Epigastric pain: Secondary | ICD-10-CM

## 2018-05-03 NOTE — Progress Notes (Signed)
[facility] [place], [place] [address]  Internal MEDICINE  Office Visit Note  Patient Name: Laura Barber  [id number]  [id number]  Date of Service: 05/11/2018  Chief Complaint  Patient presents with  . Palpitations    not as bad as they were still getting at times   . Gastroesophageal Reflux    much better   . Hyperlipidemia  . Hypertension   HPI Pt here for follow up on palpitations, GERD, HLD and HTN.  She has seen cardiology and was told her ECHO was negative.  She does have PVC's which cardiology is not concerned about at this time.  The patient has noticed no increase in symptoms. She denies chest pain, or sob but continues to report feeling the palpitations at times.  Her GERD is much improved with omeprazole.  Her blood pressure has been doing well and she denies any other complaints at this time.    Current Medication: Outpatient Encounter Medications as of 05/03/2018  Medication Sig  . ALPRAZolam (XANAX) 0.5 MG tablet Take 1 tablet (0.5 mg total) by mouth at bedtime as needed for anxiety.  Marland Kitchen atorvastatin (LIPITOR) 10 MG tablet Take 1 tablet (10 mg total) by mouth daily at 6 PM.  . cetirizine (ZYRTEC) 10 MG tablet Take 10 mg by mouth daily.  Marland Kitchen desonide (DESOWEN) 0.05 % cream   . estradiol (VIVELLE-DOT) 0.0375 MG/24HR Place 1 patch onto the skin 2 (two) times a week.  . Multiple Vitamin (MULTIVITAMIN) tablet Take 1 tablet by mouth daily.  . naproxen sodium (ANAPROX) 550 MG tablet   . omeprazole (PRILOSEC) 20 MG capsule Take 1 capsule (20 mg total) by mouth daily.  . rizatriptan (MAXALT) 5 MG tablet Take by mouth.  . valACYclovir (VALTREX) 1000 MG tablet Take 1,000 mg by mouth as needed.  . [DISCONTINUED] valsartan-hydrochlorothiazide (DIOVAN-HCT) 80-12.5 MG tablet Take 1 tablet by mouth daily.  . [DISCONTINUED] venlafaxine XR (EFFEXOR-XR) 37.5 MG 24 hr capsule Take 1 capsule (37.5 mg total) by mouth daily with breakfast.   No  facility-administered encounter medications on file as of 05/03/2018.    Surgical History: Past Surgical History:  Procedure Laterality Date  . ABDOMINAL HYSTERECTOMY  2005   Radical hysterectomy by Dr Clarene Essex at [facility] cervical cancer 1B1  . BREAST CYST ASPIRATION Left 05/30/2013  . CERVICAL DISC ARTHROPLASTY Right 06/11/2016   Procedure: CERVICAL ANTERIOR [place] ARTHROPLASTY CERVICAL 5-7;  Surgeon: Blanche East, MD;  Location: [facility] ORS;  Service: Neurosurgery;  Laterality: Right;  . DIAGNOSTIC LAPAROSCOPY  2005  . DILATION AND CURETTAGE OF UTERUS     SAB  . 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   Marland Kitchen NASAL SINUS SURGERY     Medical History: Past Medical History:  Diagnosis Date  . Anxiety   . Breast mass    left breast cyst  . Cancer (HCC)    Hx cervical cancer  . Cervical cancer, FIGO stage IB1 ([place]) 2005   Cervical  . GERD (gastroesophageal reflux disease)   . Headache   . Hyperlipidemia   . Hypertension   . Migraine    Family History: Family History  Problem Relation Age of Onset  . Lung cancer Mother   . Hyperlipidemia Mother   . Diabetes Father   . Hypertension Father   . Heart failure Father   . Heart attack Father   . Heart disease Father   . Hyperlipidemia Brother   . Breast cancer Neg Hx  Social History   Socioeconomic History  . Marital status: Divorced    Spouse name: Not on file  . Number of children: 0  . Years of education: Not on file  . Highest education level: Not on file  Occupational History  . Not on file  Social Needs  . Financial resource strain: Not on file  . Food insecurity:    Worry: Not on file    Inability: Not on file  . Transportation needs:    Medical: Not on file    Non-medical: Not on file  Tobacco Use  . Smoking status: Never Smoker  . Smokeless tobacco: Never Used  Substance and Sexual Activity  . Alcohol use: Yes    Comment: occassional  . Drug use: No  . Sexual activity:  Yes    Partners: Male    Birth control/protection: Surgical  Lifestyle  . Physical activity:    Days per week: Not on file    Minutes per session: Not on file  . Stress: Not on file  Relationships  . Social connections:    Talks on phone: Not on file    Gets together: Not on file    Attends religious service: Not on file    Active member of club or organization: Not on file    Attends meetings of clubs or organizations: Not on file    Relationship status: Not on file  . Intimate partner violence:    Fear of current or ex partner: Not on file    Emotionally abused: Not on file    Physically abused: Not on file    Forced sexual activity: Not on file  Other Topics Concern  . Not on file  Social History Narrative  . Not on file   Review of Systems  Constitutional: Negative for chills, fatigue and unexpected weight change.  HENT: Negative for congestion, rhinorrhea, sneezing and sore throat.   Eyes: Negative for photophobia, pain and redness.  Respiratory: Negative for cough, chest tightness and shortness of breath.   Cardiovascular: Negative for chest pain and palpitations.  Gastrointestinal: Negative for abdominal pain, constipation, diarrhea, nausea and vomiting.  Endocrine: Negative.   Genitourinary: Negative for dysuria and frequency.  Musculoskeletal: Negative for arthralgias, back pain, joint swelling and neck pain.  Skin: Negative for rash.  Allergic/Immunologic: Negative.   Neurological: Negative for tremors and numbness.  Hematological: Negative for adenopathy. Does not bruise/bleed easily.  Psychiatric/Behavioral: Negative for behavioral problems and sleep disturbance. The patient is not nervous/anxious.    Vital Signs: BP 120/82   Pulse 73   Resp 16   Ht 5\' 10"  (1.778 m)   Wt 184 lb 6.4 oz (83.6 kg)   SpO2 98%   BMI 26.46 kg/m   Physical Exam  Constitutional: She is oriented to person, place, and time. She appears well-developed and well-nourished. No  distress.  HENT:  Head: Normocephalic and atraumatic.  Mouth/Throat: Oropharynx is clear and moist. No oropharyngeal exudate.  Eyes: Pupils are equal, round, and reactive to light. EOM are normal.  Neck: Normal range of motion. Neck supple. No JVD present. No tracheal deviation present. No thyromegaly present.  Cardiovascular: Normal rate, regular rhythm and normal heart sounds. Exam reveals no gallop and no friction rub.  No murmur heard. Pulmonary/Chest: Effort normal and breath sounds normal. No respiratory distress. She has no wheezes. She has no rales. She exhibits no tenderness.  Abdominal: Soft. There is no tenderness. There is no guarding.  Musculoskeletal: Normal range of motion.  Lymphadenopathy:    She has no cervical adenopathy.  Neurological: She is alert and oriented to person, place, and time. No cranial nerve deficit.  Skin: Skin is warm and dry. She is not diaphoretic.  Psychiatric: She has a normal mood and affect. Her behavior is normal. Judgment and thought content normal.  Nursing note and vitals reviewed.  Assessment/Plan: 1. Palpitations Continues to experience palpitations.  Cardiology is following, they have a plan to monitor. She will let them know of changes.   2. Gastroesophageal reflux disease without esophagitis Much improved with omeprazole. Continued at this time.   3. Epigastric abdominal pain Resolved with omeprazole also.  [place] negative.   4. Hypertension, unspecified type Controlled on Diovan.    5. Hyperlipidemia, unspecified hyperlipidemia type Stable at this time on lipitor.  General Counseling: yanelis osika understanding of the findings of todays visit and agrees with plan of treatment. I have discussed any further diagnostic evaluation that may be needed or ordered today. We also reviewed her medications today. she has been encouraged to call the office with any questions or concerns that should arise related to todays visit.  Time  spent: 25 Minutes  This patient was seen by Orson Gear AGNP-C in Collaboration with Dr Lavera Guise as a part of collaborative care agreement   Dr Lavera Guise Internal medicine

## 2018-05-03 NOTE — Patient Instructions (Signed)
Palpitations A palpitation is the feeling that your heartbeat is irregular or is faster than normal. It may feel like your heart is fluttering or skipping a beat. Palpitations are usually not a serious problem. They may be caused by many things, including smoking, caffeine, alcohol, stress, and certain medicines. Although most causes of palpitations are not serious, palpitations can be a sign of a serious medical problem. In some cases, you may need further medical evaluation. Follow these instructions at home: Pay attention to any changes in your symptoms. Take these actions to help with your condition:  Avoid the following: ? Caffeinated coffee, tea, soft drinks, diet pills, and energy drinks. ? Chocolate. ? Alcohol.  Do not use any tobacco products, such as cigarettes, chewing tobacco, and e-cigarettes. If you need help quitting, ask your health care provider.  Try to reduce your stress and anxiety. Things that can help you relax include: ? Yoga. ? Meditation. ? Physical activity, such as swimming, jogging, or walking. ? Biofeedback. This is a method that helps you learn to use your mind to control things in your body, such as your heartbeats.  Get plenty of rest and sleep.  Take over-the-counter and prescription medicines only as told by your health care provider.  Keep all follow-up visits as told by your health care provider. This is important.  Contact a health care provider if:  You continue to have a fast or irregular heartbeat after 24 hours.  Your palpitations occur more often. Get help right away if:  You have chest pain or shortness of breath.  You have a severe headache.  You feel dizzy or you faint. This information is not intended to replace advice given to you by your health care provider. Make sure you discuss any questions you have with your health care provider. Document Released: 08/08/2000 Document Revised: 01/14/2016 Document Reviewed: 04/26/2015 Elsevier  Interactive Patient Education  2018 [employer]vier Inc.  

## 2018-05-04 ENCOUNTER — Other Ambulatory Visit: Payer: Self-pay

## 2018-05-04 MED ORDER — VENLAFAXINE HCL ER 37.5 MG PO CP24: 37.5000 mg | ORAL_CAPSULE | Freq: Every day | ORAL | 3 refills | Status: DC

## 2018-05-04 MED ORDER — VALSARTAN-HYDROCHLOROTHIAZIDE 80-12.5 MG PO TABS: 1.0000 | ORAL_TABLET | Freq: Every day | ORAL | 5 refills | Status: DC

## 2018-05-14 ENCOUNTER — Other Ambulatory Visit: Payer: Self-pay

## 2018-05-14 MED ORDER — ESTRADIOL 0.0375 MG/24HR TD PTTW: 1.0000 | MEDICATED_PATCH | TRANSDERMAL | 2 refills | Status: DC

## 2018-06-03 ENCOUNTER — Other Ambulatory Visit: Payer: Self-pay | Admitting: Adult Health

## 2018-06-03 MED ORDER — VALSARTAN-HYDROCHLOROTHIAZIDE 80-12.5 MG PO TABS: 1.0000 | ORAL_TABLET | Freq: Every day | ORAL | 5 refills | Status: DC

## 2018-06-08 ENCOUNTER — Other Ambulatory Visit: Payer: Self-pay

## 2018-06-08 ENCOUNTER — Telehealth: Payer: Self-pay

## 2018-06-08 MED ORDER — VALSARTAN-HYDROCHLOROTHIAZIDE 80-12.5 MG PO TABS: 1.0000 | ORAL_TABLET | Freq: Every day | ORAL | 5 refills | Status: DC

## 2018-06-08 NOTE — Telephone Encounter (Signed)
lmom that we send diovan to [facility]

## 2018-06-11 ENCOUNTER — Ambulatory Visit: Payer: BLUE CROSS/BLUE SHIELD | Admitting: Adult Health

## 2018-06-11 ENCOUNTER — Encounter: Payer: Self-pay | Admitting: Adult Health

## 2018-06-11 VITALS — BP 132/88 | HR 91 | Temp 98.2°F | Resp 16 | Ht 70.0 in | Wt 184.0 lb

## 2018-06-11 DIAGNOSIS — I10 Essential (primary) hypertension: Secondary | ICD-10-CM

## 2018-06-11 DIAGNOSIS — J0110 Acute frontal sinusitis, unspecified: Secondary | ICD-10-CM

## 2018-06-11 MED ORDER — AMOXICILLIN-POT CLAVULANATE 875-125 MG PO TABS: 1.0000 | ORAL_TABLET | Freq: Two times a day (BID) | ORAL | 0 refills | Status: DC

## 2018-06-11 NOTE — Patient Instructions (Signed)

## 2018-06-11 NOTE — Progress Notes (Signed)
[facility] [place], [place] [address]  Internal MEDICINE  Office Visit Note  Patient Name: Laura Barber  [id number]  [id number]  Date of Service: 06/11/2018  Chief Complaint  Patient presents with  . Sinusitis  . Headache  . Sore Throat     HPI Pt is here for a sick visit.  She reports sinus pain and pressure.  She reports a mild sore throat and headache.  She reports is been going on for about 4 days.  She was around her grandchildren last weekend who were sick.  She thinks she may have caught it from them.  She has been trying over-the-counter medications with no relief.     Current Medication:  Outpatient Encounter Medications as of 06/11/2018  Medication Sig  . ALPRAZolam (XANAX) 0.5 MG tablet Take 1 tablet (0.5 mg total) by mouth at bedtime as needed for anxiety.  Marland Kitchen atorvastatin (LIPITOR) 10 MG tablet Take 1 tablet (10 mg total) by mouth daily at 6 PM.  . cetirizine (ZYRTEC) 10 MG tablet Take 10 mg by mouth daily.  Marland Kitchen desonide (DESOWEN) 0.05 % cream   . estradiol (VIVELLE-DOT) 0.0375 MG/24HR Place 1 patch onto the skin 2 (two) times a week.  . Multiple Vitamin (MULTIVITAMIN) tablet Take 1 tablet by mouth daily.  . naproxen sodium (ANAPROX) 550 MG tablet   . omeprazole (PRILOSEC) 20 MG capsule Take 1 capsule (20 mg total) by mouth daily.  . rizatriptan (MAXALT) 5 MG tablet Take by mouth.  . valACYclovir (VALTREX) 1000 MG tablet Take 1,000 mg by mouth as needed.  . valsartan-hydrochlorothiazide (DIOVAN-HCT) 80-12.5 MG tablet Take 1 tablet by mouth daily.  Marland Kitchen venlafaxine XR (EFFEXOR-XR) 37.5 MG 24 hr capsule Take 1 capsule (37.5 mg total) by mouth daily with breakfast.  . amoxicillin-clavulanate (AUGMENTIN) 875-125 MG tablet Take 1 tablet by mouth 2 (two) times daily.   No facility-administered encounter medications on file as of 06/11/2018.       Medical History: Past Medical History:  Diagnosis Date  . Anxiety   . Breast mass    left breast cyst  . Cancer (HCC)    Hx cervical cancer  . Cervical cancer, FIGO stage IB1 ([place]) 2005   Cervical  . GERD (gastroesophageal reflux disease)   . Headache   . Hyperlipidemia   . Hypertension   . Migraine      Vital Signs: BP 132/88   Pulse 91   Temp 98.2 F (36.8 C)   Resp 16   Ht 5\' 10"  (1.778 m)   Wt 184 lb (83.5 kg)   SpO2 97%   BMI 26.40 kg/m    Review of Systems  Constitutional: Negative for chills, fatigue and unexpected weight change.  HENT: Positive for postnasal drip, sinus pressure, sinus pain and sore throat. Negative for congestion, rhinorrhea and sneezing.   Eyes: Negative for photophobia, pain and redness.  Respiratory: Negative for cough, chest tightness and shortness of breath.   Cardiovascular: Negative for chest pain and palpitations.  Gastrointestinal: Negative for abdominal pain, constipation, diarrhea, nausea and vomiting.  Endocrine: Negative.   Genitourinary: Negative for dysuria and frequency.  Musculoskeletal: Negative for arthralgias, back pain, joint swelling and neck pain.  Skin: Negative for rash.  Allergic/Immunologic: Negative.   Neurological: Negative for tremors and numbness.  Hematological: Negative for adenopathy. Does not bruise/bleed easily.  Psychiatric/Behavioral: Negative for behavioral problems and sleep disturbance. The patient is not nervous/anxious.     Physical Exam  Constitutional: She  is oriented to person, place, and time. She appears well-developed and well-nourished. No distress.  HENT:  Head: Normocephalic and atraumatic.  Mouth/Throat: Oropharynx is clear and moist. No oropharyngeal exudate.  Mild erythema to pharynx.  Tender to palpation.  Eyes: Pupils are equal, round, and reactive to light. EOM are normal.  Neck: Normal range of motion. Neck supple. No JVD present. No tracheal deviation present. No thyromegaly present.  Cardiovascular: Normal rate, regular rhythm and normal heart sounds. Exam reveals  no gallop and no friction rub.  No murmur heard. Pulmonary/Chest: Effort normal and breath sounds normal. No respiratory distress. She has no wheezes. She has no rales. She exhibits no tenderness.  Abdominal: Soft. There is no tenderness. There is no guarding.  Musculoskeletal: Normal range of motion.  Lymphadenopathy:    She has no cervical adenopathy.  Neurological: She is alert and oriented to person, place, and time. No cranial nerve deficit.  Skin: Skin is warm and dry. She is not diaphoretic.  Psychiatric: She has a normal mood and affect. Her behavior is normal. Judgment and thought content normal.  Nursing note and vitals reviewed.  Assessment/Plan: 1. Acute non-recurrent frontal sinusitis Encourage patient to continue using over-the-counter sinus medications.  Will treat with course of Augmentin.  Instructed patient to return to clinic if symptoms do not improve over the next week. - amoxicillin-clavulanate (AUGMENTIN) 875-125 MG tablet; Take 1 tablet by mouth 2 (two) times daily.  Dispense: 20 tablet; Refill: 0  2. Hypertension, unspecified type Blood pressure stable at this time.  Continue current medications as prescribed.  General Counseling: kwynn schlotter understanding of the findings of todays visit and agrees with plan of treatment. I have discussed any further diagnostic evaluation that may be needed or ordered today. We also reviewed her medications today. she has been encouraged to call the office with any questions or concerns that should arise related to todays visit.   No orders of the defined types were placed in this encounter.   Meds ordered this encounter  Medications  . amoxicillin-clavulanate (AUGMENTIN) 875-125 MG tablet    Sig: Take 1 tablet by mouth 2 (two) times daily.    Dispense:  20 tablet    Refill:  0    Time spent: 20 Minutes  This patient was seen by Orson Gear AGNP-C in Collaboration with Dr Lavera Guise as a part of collaborative  care agreement.  Kendell Bane AGNP-C Internal Medicine

## 2018-07-26 DIAGNOSIS — Z9889 Other specified postprocedural states: Secondary | ICD-10-CM | POA: Diagnosis not present

## 2018-07-26 DIAGNOSIS — Z981 Arthrodesis status: Secondary | ICD-10-CM | POA: Diagnosis not present

## 2018-07-26 DIAGNOSIS — R293 Abnormal posture: Secondary | ICD-10-CM | POA: Diagnosis not present

## 2018-08-02 ENCOUNTER — Ambulatory Visit: Payer: Self-pay | Admitting: Adult Health

## 2018-08-26 ENCOUNTER — Other Ambulatory Visit: Payer: Self-pay

## 2018-08-30 ENCOUNTER — Ambulatory Visit: Payer: BLUE CROSS/BLUE SHIELD | Admitting: Adult Health

## 2018-08-30 ENCOUNTER — Encounter: Payer: Self-pay | Admitting: Adult Health

## 2018-08-30 VITALS — BP 145/75 | HR 79 | Resp 16 | Ht 71.0 in | Wt 185.0 lb

## 2018-08-30 DIAGNOSIS — F411 Generalized anxiety disorder: Secondary | ICD-10-CM

## 2018-08-30 DIAGNOSIS — G43809 Other migraine, not intractable, without status migrainosus: Secondary | ICD-10-CM | POA: Diagnosis not present

## 2018-08-30 DIAGNOSIS — I10 Essential (primary) hypertension: Secondary | ICD-10-CM | POA: Diagnosis not present

## 2018-08-30 DIAGNOSIS — R635 Abnormal weight gain: Secondary | ICD-10-CM

## 2018-08-30 DIAGNOSIS — E785 Hyperlipidemia, unspecified: Secondary | ICD-10-CM | POA: Diagnosis not present

## 2018-08-30 MED ORDER — NAPROXEN SODIUM 550 MG PO TABS: 550.0000 mg | ORAL_TABLET | Freq: Two times a day (BID) | ORAL | 2 refills | Status: DC

## 2018-08-30 MED ORDER — ALPRAZOLAM 0.5 MG PO TABS: 0.5000 mg | ORAL_TABLET | Freq: Every evening | ORAL | 1 refills | Status: DC | PRN

## 2018-08-30 MED ORDER — VENLAFAXINE HCL ER 37.5 MG PO CP24: 37.5000 mg | ORAL_CAPSULE | Freq: Every day | ORAL | 3 refills | Status: DC

## 2018-08-30 MED ORDER — RIZATRIPTAN BENZOATE 5 MG PO TABS: 5.0000 mg | ORAL_TABLET | ORAL | 2 refills | Status: DC | PRN

## 2018-08-30 NOTE — Patient Instructions (Signed)

## 2018-08-30 NOTE — Progress Notes (Signed)
[facility] [place], [place] [address]  Internal MEDICINE  Office Visit Note  Patient Name: Laura Barber  [id number]  [id number]  Date of Service: 08/30/2018  Chief Complaint  Patient presents with  . Hyperlipidemia  . Hypertension    HPI Pt is here for follow up on HLD and HTN.  Blood pressure is slightly elevated today 45/75.  She reports she is just come from work and was rushing.  She states that she is not had a headache, chest pain, shortness of breath or any other issues.  Her hyperlipidemia is stable at last lipid panel.  She denies any issues today.  She is however requesting some refills on some of her chronic medications.    Current Medication: Outpatient Encounter Medications as of 08/30/2018  Medication Sig  . ALPRAZolam (XANAX) 0.5 MG tablet Take 1 tablet (0.5 mg total) by mouth at bedtime as needed for anxiety.  Marland Kitchen atorvastatin (LIPITOR) 10 MG tablet Take 1 tablet (10 mg total) by mouth daily at 6 PM.  . cetirizine (ZYRTEC) 10 MG tablet Take 10 mg by mouth daily.  Marland Kitchen desonide (DESOWEN) 0.05 % cream   . estradiol (VIVELLE-DOT) 0.0375 MG/24HR Place 1 patch onto the skin 2 (two) times a week.  . Multiple Vitamin (MULTIVITAMIN) tablet Take 1 tablet by mouth daily.  . naproxen sodium (ANAPROX) 550 MG tablet Take 1 tablet (550 mg total) by mouth 2 (two) times daily with a meal.  . omeprazole (PRILOSEC) 20 MG capsule Take 1 capsule (20 mg total) by mouth daily.  . rizatriptan (MAXALT) 5 MG tablet Take 1 tablet (5 mg total) by mouth as needed for migraine.  . valACYclovir (VALTREX) 1000 MG tablet Take 1,000 mg by mouth as needed.  . valsartan-hydrochlorothiazide (DIOVAN-HCT) 80-12.5 MG tablet Take 1 tablet by mouth daily.  Marland Kitchen venlafaxine XR (EFFEXOR-XR) 37.5 MG 24 hr capsule Take 1 capsule (37.5 mg total) by mouth daily with breakfast.  . [DISCONTINUED] ALPRAZolam (XANAX) 0.5 MG tablet Take 1 tablet (0.5 mg total) by mouth at bedtime as needed for  anxiety.  . [DISCONTINUED] naproxen sodium (ANAPROX) 550 MG tablet   . [DISCONTINUED] rizatriptan (MAXALT) 5 MG tablet Take by mouth.  . [DISCONTINUED] venlafaxine XR (EFFEXOR-XR) 37.5 MG 24 hr capsule Take 1 capsule (37.5 mg total) by mouth daily with breakfast.  . [DISCONTINUED] amoxicillin-clavulanate (AUGMENTIN) 875-125 MG tablet Take 1 tablet by mouth 2 (two) times daily.   No facility-administered encounter medications on file as of 08/30/2018.     Surgical History: Past Surgical History:  Procedure Laterality Date  . ABDOMINAL HYSTERECTOMY  2005   Radical hysterectomy by Dr Clarene Essex at [facility] cervical cancer 1B1  . BREAST CYST ASPIRATION Left 05/30/2013  . CERVICAL DISC ARTHROPLASTY Right 06/11/2016   Procedure: CERVICAL ANTERIOR [place] ARTHROPLASTY CERVICAL 5-7;  Surgeon: Blanche East, MD;  Location: [facility] ORS;  Service: Neurosurgery;  Laterality: Right;  . DIAGNOSTIC LAPAROSCOPY  2005  . DILATION AND CURETTAGE OF UTERUS     SAB  . 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   Marland Kitchen NASAL SINUS SURGERY      Medical History: Past Medical History:  Diagnosis Date  . Anxiety   . Breast mass    left breast cyst  . Cancer (HCC)    Hx cervical cancer  . Cervical cancer, FIGO stage IB1 ([place]) 2005   Cervical  . GERD (gastroesophageal reflux disease)   . Headache   . Hyperlipidemia   .  Hypertension   . Migraine     Family History: Family History  Problem Relation Age of Onset  . Lung cancer Mother   . Hyperlipidemia Mother   . Diabetes Father   . Hypertension Father   . Heart failure Father   . Heart attack Father   . Heart disease Father   . Hyperlipidemia Brother   . Breast cancer Neg Hx     Social History   Socioeconomic History  . Marital status: Divorced    Spouse name: Not on file  . Number of children: 0  . Years of education: Not on file  . Highest education level: Not on file  Occupational History  . Not on file  Social  Needs  . Financial resource strain: Not on file  . Food insecurity:    Worry: Not on file    Inability: Not on file  . Transportation needs:    Medical: Not on file    Non-medical: Not on file  Tobacco Use  . Smoking status: Never Smoker  . Smokeless tobacco: Never Used  Substance and Sexual Activity  . Alcohol use: Yes    Comment: occassional  . Drug use: No  . Sexual activity: Yes    Partners: Male    Birth control/protection: Surgical  Lifestyle  . Physical activity:    Days per week: Not on file    Minutes per session: Not on file  . Stress: Not on file  Relationships  . Social connections:    Talks on phone: Not on file    Gets together: Not on file    Attends religious service: Not on file    Active member of club or organization: Not on file    Attends meetings of clubs or organizations: Not on file    Relationship status: Not on file  . Intimate partner violence:    Fear of current or ex partner: Not on file    Emotionally abused: Not on file    Physically abused: Not on file    Forced sexual activity: Not on file  Other Topics Concern  . Not on file  Social History Narrative  . Not on file      Review of Systems  Constitutional: Negative for chills, fatigue and unexpected weight change.  HENT: Negative for congestion, rhinorrhea, sneezing and sore throat.   Eyes: Negative for photophobia, pain and redness.  Respiratory: Negative for cough, chest tightness and shortness of breath.   Cardiovascular: Negative for chest pain and palpitations.  Gastrointestinal: Negative for abdominal pain, constipation, diarrhea, nausea and vomiting.  Endocrine: Negative.   Genitourinary: Negative for dysuria and frequency.  Musculoskeletal: Negative for arthralgias, back pain, joint swelling and neck pain.  Skin: Negative for rash.  Allergic/Immunologic: Negative.   Neurological: Negative for tremors and numbness.  Hematological: Negative for adenopathy. Does not  bruise/bleed easily.  Psychiatric/Behavioral: Negative for behavioral problems and sleep disturbance. The patient is not nervous/anxious.     Vital Signs: BP (!) 145/75   Pulse 79   Resp 16   Ht 5\' 11"  (1.803 m)   Wt 185 lb (83.9 kg)   SpO2 95%   BMI 25.80 kg/m    Physical Exam Vitals signs and nursing note reviewed.  Constitutional:      General: She is not in acute distress.    Appearance: She is well-developed. She is not diaphoretic.  HENT:     Head: Normocephalic and atraumatic.     Mouth/Throat:  Pharynx: No oropharyngeal exudate.  Eyes:     Pupils: Pupils are equal, round, and reactive to light.  Neck:     Musculoskeletal: Normal range of motion and neck supple.     Thyroid: No thyromegaly.     Vascular: No JVD.     Trachea: No tracheal deviation.  Cardiovascular:     Rate and Rhythm: Normal rate and regular rhythm.     Heart sounds: Normal heart sounds. No murmur. No friction rub. No gallop.   Pulmonary:     Effort: Pulmonary effort is normal. No respiratory distress.     Breath sounds: Normal breath sounds. No wheezing or rales.  Chest:     Chest wall: No tenderness.  Abdominal:     Palpations: Abdomen is soft.     Tenderness: There is no abdominal tenderness. There is no guarding.  Musculoskeletal: Normal range of motion.  Lymphadenopathy:     Cervical: No cervical adenopathy.  Skin:    General: Skin is warm and dry.  Neurological:     Mental Status: She is alert and oriented to person, place, and time.     Cranial Nerves: No cranial nerve deficit.  Psychiatric:        Behavior: Behavior normal.        Thought Content: Thought content normal.        Judgment: Judgment normal.    Assessment/Plan: 1. Hypertension, unspecified type Stable, continue current medications as prescribed.   2. Hyperlipidemia, unspecified hyperlipidemia type Stable continue current medications as prescribed  3. Generalized anxiety disorder Patient's Xanax refilled at  this time.  She rarely takes Xanax states that she feels better knowing that she has it. - ALPRAZolam (XANAX) 0.5 MG tablet; Take 1 tablet (0.5 mg total) by mouth at bedtime as needed for anxiety.  Dispense: 30 tablet; Refill: 1  4. Other migraine without status migrainosus, not intractable Patient bring medications refilled at this visit per her request. - naproxen sodium (ANAPROX) 550 MG tablet; Take 1 tablet (550 mg total) by mouth 2 (two) times daily with a meal.  Dispense: 60 tablet; Refill: 2 - rizatriptan (MAXALT) 5 MG tablet; Take 1 tablet (5 mg total) by mouth as needed for migraine.  Dispense: 10 tablet; Refill: 2 - venlafaxine XR (EFFEXOR-XR) 37.5 MG 24 hr capsule; Take 1 capsule (37.5 mg total) by mouth daily with breakfast.  Dispense: 30 capsule; Refill: 3  5. Weight gain Patient provided with sample of Saxenda.  Instructed patient on how to use in dose of medication.  General Counseling: larysa pall understanding of the findings of todays visit and agrees with plan of treatment. I have discussed any further diagnostic evaluation that may be needed or ordered today. We also reviewed her medications today. she has been encouraged to call the office with any questions or concerns that should arise related to todays visit.    No orders of the defined types were placed in this encounter.   Meds ordered this encounter  Medications  . ALPRAZolam (XANAX) 0.5 MG tablet    Sig: Take 1 tablet (0.5 mg total) by mouth at bedtime as needed for anxiety.    Dispense:  30 tablet    Refill:  1  . naproxen sodium (ANAPROX) 550 MG tablet    Sig: Take 1 tablet (550 mg total) by mouth 2 (two) times daily with a meal.    Dispense:  60 tablet    Refill:  2  . rizatriptan (MAXALT) 5 MG tablet  Sig: Take 1 tablet (5 mg total) by mouth as needed for migraine.    Dispense:  10 tablet    Refill:  2  . venlafaxine XR (EFFEXOR-XR) 37.5 MG 24 hr capsule    Sig: Take 1 capsule (37.5 mg total)  by mouth daily with breakfast.    Dispense:  30 capsule    Refill:  3    Time spent: 25 Minutes   This patient was seen by Orson Gear AGNP-C in Collaboration with Dr Lavera Guise as a part of collaborative care agreement     Kendell Bane AGNP-C Internal medicine

## 2018-09-07 ENCOUNTER — Telehealth: Payer: Self-pay | Admitting: Cardiovascular Disease

## 2018-09-07 NOTE — Telephone Encounter (Signed)
STAT if patient feels like he/she is going to faint   1) Are you dizzy now? No episodes started in the last week   2) Do you feel faint or have you passed out?  Yes faint feeling no LOC  3) Do you have any other symptoms? Feeling like about to pass out darkened Periferal hot     4) Have you checked your HR and BP (record if available)? No    Scheduled 1/17 with Ryan at 11

## 2018-09-07 NOTE — Telephone Encounter (Signed)
Called patient back.  States she alwayshad occasional dizziness and fluttering; however, dizziness has gotten worse over the past week. She has had 3 episodes where she has gotten up and after walking 15-30 steps she feels dizzy, hot sensation, ears closing, fluttering in heart and wants to take more deep breaths.  Describes it being very quick and lasting less than 5 minutes. Severity of dizziness increased about 1 week ago. She cannot relate it to anything such as stress or anxiety. Denies chest pain or pressure.  Almost feels like her blood sugar going low but not exactly. She's still been able to exercise with no problem; episodes don't occur during exercise. Patient has appointment scheduled to see Thurmond Butts on Friday. Advised her to keep appointment as scheduled. Pt verbalized understanding to go to the emergency room or call 911 if the chest pain worsens and he develops other symptoms, such as SOB, nausea, vomiting, or sweating. She was appreciative.

## 2018-09-09 ENCOUNTER — Other Ambulatory Visit: Payer: Self-pay | Admitting: Adult Health

## 2018-09-09 MED ORDER — ATORVASTATIN CALCIUM 10 MG PO TABS: 10.0000 mg | ORAL_TABLET | Freq: Every day | ORAL | 5 refills | Status: DC

## 2018-09-09 NOTE — Progress Notes (Signed)
Cardiology Office Note Date:  09/10/2018  Patient ID:  Laura, Barber 1966/03/31, MRN [id number] PCP:  Lavera Guise, MD  Cardiologist:  Dr. Fletcher Anon, MD    Chief Complaint: Dizziness/palpitations   History of Present Illness: Laura Barber is a 53 y.o. female with history of palpitations/PVCs, hypertension, hyperlipidemia, anxiety, and GERD who presents for follow-up of dizziness.  Patient was evaluated by Dr. Fletcher Anon on 04/09/2018 for palpitations and PVCs.  Patient reported onset of palpitations over the prior few months that was described as a fluttering sensation mostly at rest and most noticeable at night when she was trying to sleep.  There was associated mild shortness of breath.  No chest pain, dizziness, presyncope, or syncope.  Prior 48-hour Holter monitor showed 2400 PVCs in 48 hours.  Upon cardiology review of the monitor there was noted to be quite a bit of motion artifact which may have interfered with the count.  Cardiology felt her PVC burden was much less than the reported 2400.  She reported a family history remarkable for CAD, though not prematurely.  There was no family history of sudden death.  Echo was obtained on 2018-04-27 to evaluate for structural heart disease and showed an EF of 60 to 65%, no regional wall motion abnormalities, normal LV diastolic function, normal RV cavity size and systolic function.  No significant valvular abnormalities.  Labs: 03/2018 - TSH normal, free T4 normal, potassium 4.6, serum creatinine 0.97, AST/ALT normal, LDL 103, CBC normal  Patient called on 09/07/2018 noted occasional dizziness and fluttering that had gotten worse over the prior week.  She reported 3 episodes where she had gotten up and after walking 15-30 steps she felt dizzy, flushed, and tachypalpitations.  Symptoms lasted less than 5 minutes.  She continued to be able to exercise without problem.  Appointment was made for 09/10/2018.  She comes in today noting 3 episodes of  the week prior of lightheadedness/dizziness/ear fullness/flushing/sweating/mild palpitations that lasted for a couple of minutes with spontaneous resolution.  2 episodes occurred while the patient was standing and walking and one episode occurred while she was at a physician's office sitting down though she did not notify them of her symptoms.  There was no associated chest pain or shortness of breath.  She never suffered any loss of consciousness.  She has continued to pursue her exercises with her [facility] and has been asymptomatic when she is exerting herself.  She denies any lower extremity swelling, abdominal distention, orthopnea, PND, or early satiety.  She denies any loss of consciousness.  She has been completely asymptomatic when driving a car.  No recent vomiting or diarrhea.  She has noted an increase in nausea since the above episodes the week prior.  Since these past 3 episodes, she has been completely asymptomatic.  She does not check her blood pressure at home.  Currently asymptomatic.  Past Medical History:  Diagnosis Date  . Anxiety   . Breast mass    left breast cyst  . Cancer (HCC)    Hx cervical cancer  . Cervical cancer, FIGO stage IB1 ([place]) 2005   Cervical  . GERD (gastroesophageal reflux disease)   . Headache   . Hyperlipidemia   . Hypertension   . Migraine     Past Surgical History:  Procedure Laterality Date  . ABDOMINAL HYSTERECTOMY  2005   Radical hysterectomy by Dr Clarene Essex at [facility] cervical cancer 1B1  . BREAST CYST ASPIRATION Left 05/30/2013  . CERVICAL DISC  ARTHROPLASTY Right 06/11/2016   Procedure: CERVICAL ANTERIOR [place] ARTHROPLASTY CERVICAL 5-7;  Surgeon: Blanche East, MD;  Location: [facility] ORS;  Service: Neurosurgery;  Laterality: Right;  . DIAGNOSTIC LAPAROSCOPY  2005  . DILATION AND CURETTAGE OF UTERUS     SAB  . 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   Marland Kitchen NASAL SINUS SURGERY      Current Meds  Medication Sig    . ALPRAZolam (XANAX) 0.5 MG tablet Take 1 tablet (0.5 mg total) by mouth at bedtime as needed for anxiety.  Marland Kitchen atorvastatin (LIPITOR) 10 MG tablet Take 1 tablet (10 mg total) by mouth daily at 6 PM.  . cetirizine (ZYRTEC) 10 MG tablet Take 10 mg by mouth daily.  Marland Kitchen desonide (DESOWEN) 0.05 % cream   . estradiol (VIVELLE-DOT) 0.0375 MG/24HR Place 1 patch onto the skin 2 (two) times a week.  . Multiple Vitamin (MULTIVITAMIN) tablet Take 1 tablet by mouth daily.  . naproxen sodium (ANAPROX) 550 MG tablet Take 1 tablet (550 mg total) by mouth 2 (two) times daily with a meal.  . omeprazole (PRILOSEC) 20 MG capsule Take 1 capsule (20 mg total) by mouth daily.  . rizatriptan (MAXALT) 5 MG tablet Take 1 tablet (5 mg total) by mouth as needed for migraine.  . valACYclovir (VALTREX) 1000 MG tablet Take 1,000 mg by mouth as needed.  . valsartan-hydrochlorothiazide (DIOVAN-HCT) 80-12.5 MG tablet Take 1 tablet by mouth daily.  Marland Kitchen venlafaxine XR (EFFEXOR-XR) 37.5 MG 24 hr capsule Take 1 capsule (37.5 mg total) by mouth daily with breakfast.    Allergies:   Claritin-d 12 hour [loratadine-pseudoephedrine er] and Sulfa antibiotics   Social History:  The patient  reports that she has never smoked. She has never used smokeless tobacco. She reports current alcohol use. She reports that she does not use drugs.   Family History:  The patient's family history includes Diabetes in her father; Heart attack in her father; Heart disease in her father; Heart failure in her father; Hyperlipidemia in her brother and mother; Hypertension in her father; Lung cancer in her mother.  ROS:   Review of Systems  Constitutional: Positive for diaphoresis and malaise/fatigue. Negative for chills, fever and weight loss.  HENT: Negative for congestion.   Eyes: Negative for discharge and redness.  Respiratory: Negative for cough, hemoptysis, sputum production, shortness of breath and wheezing.   Cardiovascular: Positive for  palpitations. Negative for chest pain, orthopnea, claudication, leg swelling and PND.  Gastrointestinal: Positive for nausea. Negative for abdominal pain, blood in stool, constipation, diarrhea, heartburn, melena and vomiting.  Genitourinary: Negative for hematuria.  Musculoskeletal: Negative for falls and myalgias.  Skin: Negative for rash.  Neurological: Positive for dizziness and weakness. Negative for tingling, tremors, sensory change, speech change, focal weakness and loss of consciousness.       Lightheadedness Presyncope Flushing  Endo/Heme/Allergies: Does not bruise/bleed easily.  Psychiatric/Behavioral: Negative for substance abuse. The patient is not nervous/anxious.   All other systems reviewed and are negative.    PHYSICAL EXAM:  VS:  BP 125/73 (BP Location: Left Arm, Patient Position: Sitting, Cuff Size: Normal)   Pulse 66   Ht 5\' 11"  (1.803 m)   Wt 186 lb 8 oz (84.6 kg)   BMI 26.01 kg/m  BMI: Body mass index is 26.01 kg/m.  Physical Exam  Constitutional: She is oriented to person, place, and time. She appears well-developed and well-nourished.  HENT:  Head: Normocephalic and atraumatic.  Eyes: Right eye  exhibits no discharge. Left eye exhibits no discharge.  Neck: Normal range of motion. No JVD present.  Cardiovascular: Normal rate, regular rhythm, S1 normal, S2 normal and normal heart sounds. Exam reveals no distant heart sounds, no friction rub, no midsystolic click and no opening snap.  No murmur heard. Pulses:      Posterior tibial pulses are 2+ on the right side and 2+ on the left side.  Pulmonary/Chest: Effort normal and breath sounds normal. No respiratory distress. She has no decreased breath sounds. She has no wheezes. She has no rales. She exhibits no tenderness.  Abdominal: Soft. She exhibits no distension. There is no abdominal tenderness.  Musculoskeletal:        General: No edema.  Neurological: She is alert and oriented to person, place, and time.    Skin: Skin is warm and dry. No cyanosis. Nails show no clubbing.  Psychiatric: She has a normal mood and affect. Her speech is normal and behavior is normal. Judgment and thought content normal.     EKG:  Was ordered and interpreted by me today. Shows NSR, 66 bpm, no acute st/t changes   Recent Labs: 03/29/2018: ALT 21; BUN 21; Creatinine, Ser 0.97; Hemoglobin 14.3; Platelets 186; Potassium 4.6; Sodium 140; TSH 2.200  03/29/2018: Cholesterol, Total 196; HDL 64; LDL Calculated 103; Triglycerides 147   CrCl cannot be calculated (Patient's most recent lab result is older than the maximum 21 days allowed.).   Wt Readings from Last 3 Encounters:  09/10/18 186 lb 8 oz (84.6 kg)  08/30/18 185 lb (83.9 kg)  06/11/18 184 lb (83.5 kg)    Orthostatic vital signs: Lying: 123/64, 67 bpm Sitting: 126/79, 69 bpm, dizzy Standing: 115/69, 69 bpm Standing x3 minutes: 119/74, 69 bpm  Other studies reviewed: Additional studies/records reviewed today include: summarized above  ASSESSMENT AND PLAN:  1. Palpitations/symptomatic PVCs/dizziness/lightheadedness: Symptoms are concerning for ventricular ectopy.  Schedule real-time ZIO monitoring for the next month.  Check magnesium, BMP, TSH, and CBC.  Start Lopressor 12.5 mg twice daily.  Recent echo demonstrating preserved LV systolic function without structural abnormalities.  If outpatient cardiac monitoring demonstrates significant ectopy she may require ischemic evaluation with or without further cardiac imaging.  2. Hypertension: Well-controlled on Diovan HCT.  Add Lopressor as above.  Disposition: F/u with Dr. Fletcher Anon or an APP in 6 weeks.  Current medicines are reviewed at length with the patient today.  The patient did not have any concerns regarding medicines.  Signed, Christell Faith, PA-C 09/10/2018 11:20 AM     [place] [address] [place] [address] [place], [place] [address] [phone]

## 2018-09-10 ENCOUNTER — Encounter: Payer: Self-pay | Admitting: Physician Assistant

## 2018-09-10 ENCOUNTER — Ambulatory Visit (INDEPENDENT_AMBULATORY_CARE_PROVIDER_SITE_OTHER): Payer: BLUE CROSS/BLUE SHIELD

## 2018-09-10 ENCOUNTER — Ambulatory Visit: Payer: BLUE CROSS/BLUE SHIELD | Admitting: Physician Assistant

## 2018-09-10 VITALS — BP 125/73 | HR 66 | Ht 71.0 in | Wt 186.5 lb

## 2018-09-10 DIAGNOSIS — R002 Palpitations: Secondary | ICD-10-CM | POA: Diagnosis not present

## 2018-09-10 DIAGNOSIS — I10 Essential (primary) hypertension: Secondary | ICD-10-CM | POA: Diagnosis not present

## 2018-09-10 DIAGNOSIS — R42 Dizziness and giddiness: Secondary | ICD-10-CM

## 2018-09-10 DIAGNOSIS — R55 Syncope and collapse: Secondary | ICD-10-CM | POA: Diagnosis not present

## 2018-09-10 DIAGNOSIS — R0602 Shortness of breath: Secondary | ICD-10-CM | POA: Diagnosis not present

## 2018-09-10 DIAGNOSIS — I493 Ventricular premature depolarization: Secondary | ICD-10-CM | POA: Diagnosis not present

## 2018-09-10 MED ORDER — METOPROLOL TARTRATE 25 MG PO TABS: 12.5000 mg | ORAL_TABLET | Freq: Two times a day (BID) | ORAL | 3 refills | Status: DC

## 2018-09-10 NOTE — Patient Instructions (Addendum)
Medication Instructions:  Your physician has recommended you make the following change in your medication:  1- START Lopressor Take 0.5 tablets (12.5 mg total) by mouth 2 (two) times daily If you need a refill on your cardiac medications before your next appointment, please call your pharmacy.   Lab work: Your physician recommends that you return for lab work today (BMET, Mag, CBC, TSH)  If you have labs (blood work) drawn today and your tests are completely normal, you will receive your results only by: Marland Kitchen MyChart Message (if you have MyChart) OR . A paper copy in the mail If you have any lab test that is abnormal or we need to change your treatment, we will call you to review the results.  Testing/Procedures: 1- Zio AT, real time monitor (ID # [id number]) A zio monitor was placed today. It will remain on for 14 days. You will then return monitor and event diary in provided box. It takes 1-2 weeks for report to be downloaded and returned to [place]. We will call you with the results. If monitor falls of or has orange flashing light, please call [employer] for further instructions.   2-  Zio AT: We will place order and you will receive it in the mail.  You may get a call from [employer] @ either  [phone] Or  [phone] for them to confirm your address before it will be sent to you.  You will wear the monitor for 14 days, remove it and send it back to the company. They will send [place] a report. Then we will call you with the results.   Follow-Up: At [facility], you and your health needs are our priority.  As part of our continuing mission to provide you with exceptional heart care, we have created designated Provider Care Teams.  These Care Teams include your primary Cardiologist (physician) and Advanced Practice Providers (APPs -  Physician Assistants and Nurse Practitioners) who all work together to provide you with the care you need, when you need it. You will need a follow up appointment in  6 weeks. You may see Dr. Fletcher Anon or one of the following Advanced Practice Providers on your designated Care Team:   Murray Hodgkins, NP Christell Faith, PA-C . Marrianne Mood, PA-C

## 2018-09-11 LAB — BASIC METABOLIC PANEL
BUN/Creatinine Ratio: 22 (ref 9–23)
BUN: 18 mg/dL (ref 6–24)
CO2: 21 mmol/L (ref 20–29)
Calcium: 9.5 mg/dL (ref 8.7–10.2)
Chloride: 99 mmol/L (ref 96–106)
Creatinine, Ser: 0.82 mg/dL (ref 0.57–1.00)
GFR calc Af Amer: 96 mL/min/{1.73_m2} (ref >59)
GFR calc non Af Amer: 83 mL/min/{1.73_m2} (ref >59)
Glucose: 73 mg/dL (ref 65–99)
Potassium: 4.2 mmol/L (ref 3.5–5.2)
Sodium: 140 mmol/L (ref 134–144)

## 2018-09-11 LAB — CBC
Hematocrit: 39.4 % (ref 34.0–46.6)
Hemoglobin: 13.9 g/dL (ref 11.1–15.9)
MCH: 31.1 pg (ref 26.6–33.0)
MCHC: 35.3 g/dL (ref 31.5–35.7)
MCV: 88 fL (ref 79–97)
Platelets: 221 10*3/uL (ref 150–450)
RBC: 4.47 x10E6/uL (ref 3.77–5.28)
RDW: 12.4 % (ref 11.7–15.4)
WBC: 7.2 10*3/uL (ref 3.4–10.8)

## 2018-09-11 LAB — TSH: TSH: 1.45 u[IU]/mL (ref 0.450–4.500)

## 2018-09-11 LAB — MAGNESIUM: Magnesium: 2 mg/dL (ref 1.6–2.3)

## 2018-09-20 ENCOUNTER — Telehealth: Payer: Self-pay

## 2018-09-20 NOTE — Telephone Encounter (Signed)
Call to patient to confirm appt, pt is coming this Friday for placement of second monitor that was mailed to her. She reported that she prefers [place] to place.  Pt did report that 2 monitors had been mailed to her. She spoke with [employer] last week and company told her that the second could be turned into [place].  I will call the company on Friday to confirm that registered device is the one we will place as well as what to do with the second device (keep it for use on another patient or send it back).   Pt reported that monitor use was going well at this time and she has not experienced any difficulties. Advised pt to call for any further questions or concerns.

## 2018-09-24 ENCOUNTER — Ambulatory Visit: Payer: BLUE CROSS/BLUE SHIELD

## 2018-09-24 ENCOUNTER — Ambulatory Visit (INDEPENDENT_AMBULATORY_CARE_PROVIDER_SITE_OTHER): Payer: BLUE CROSS/BLUE SHIELD

## 2018-09-24 DIAGNOSIS — R55 Syncope and collapse: Secondary | ICD-10-CM

## 2018-09-24 DIAGNOSIS — R42 Dizziness and giddiness: Secondary | ICD-10-CM | POA: Diagnosis not present

## 2018-09-24 NOTE — Patient Instructions (Addendum)
  Medication Instructions:  Your physician recommends that you continue on your current medications as directed. Please refer to the Current Medication list given to you today.  If you need a refill on your cardiac medications before your next appointment, please call your pharmacy.   Lab work: None ordered  If you have labs (blood work) drawn today and your tests are completely normal, you will receive your results only by: Marland Kitchen MyChart Message (if you have MyChart) OR . A paper copy in the mail If you have any lab test that is abnormal or we need to change your treatment, we will call you to review the results.  Testing/Procedures: A zio monitor was placed today. It will remain on for 14 days. You will then return monitor and event diary in provided box. It takes 1-2 weeks for report to be downloaded and returned to [place]. We will call you with the results. If monitor falls of or has orange flashing light, please call Zio for further instructions.    Follow-Up: At [facility], you and your health needs are our priority.  As part of our continuing mission to provide you with exceptional heart care, we have created designated Provider Care Teams.  These Care Teams include your primary Cardiologist (physician) and Advanced Practice Providers (APPs -  Physician Assistants and Nurse Practitioners) who all work together to provide you with the care you need, when you need it. F/u as previously planned.

## 2018-09-27 DIAGNOSIS — Z1283 Encounter for screening for malignant neoplasm of skin: Secondary | ICD-10-CM | POA: Diagnosis not present

## 2018-09-27 DIAGNOSIS — L578 Other skin changes due to chronic exposure to nonionizing radiation: Secondary | ICD-10-CM | POA: Diagnosis not present

## 2018-09-27 DIAGNOSIS — Z86018 Personal history of other benign neoplasm: Secondary | ICD-10-CM | POA: Diagnosis not present

## 2018-09-27 DIAGNOSIS — L570 Actinic keratosis: Secondary | ICD-10-CM | POA: Diagnosis not present

## 2018-09-27 NOTE — Progress Notes (Unsigned)
1.) Reason for visit: placement of second Zio AT monitor, pt will remove 2/14 and send back   2.) Name of MD requesting visit: Christell Faith, PA   3.) H&P: postural dizziness with presyncope   4.) ROS related to problem: pt feeling well today, she reports that she has been using app for data entry. She denies any urgent calls from company. Denies chest pain or syncopal episodes.  5.) Assessment and plan per MD: 2nd monitor placed and turned on. (# [id number]). Called zio to confirm transmission. Confirmed.  Pt will remove on 2/14 and return to company. She has f/u on 2/28 with Christell Faith, PA.

## 2018-10-05 ENCOUNTER — Telehealth: Payer: Self-pay

## 2018-10-05 NOTE — Telephone Encounter (Signed)
-----   Message from Rise Mu, PA-C sent at 10/01/2018 10:54 AM EST ----- Initial cardiac monitor showed NSR with an average heart rate of 74 bpm with isolated PACs and occasional PVCs with a burden of 2.4% (possibly overestimated due to artifact). Await remaining monitor. So far, monitoring is reassuring.

## 2018-10-05 NOTE — Telephone Encounter (Signed)
Call to patient to review results from 1st monitor. Pt verbalized understanding and had no further questions at this time. No new orders.  I reassured pt that we will call when the next results are processed.  Advised pt to call for any further questions or concerns.

## 2018-10-08 ENCOUNTER — Other Ambulatory Visit: Payer: Self-pay

## 2018-10-08 MED ORDER — DESONIDE 0.05 % EX CREA: TOPICAL_CREAM | CUTANEOUS | 3 refills | Status: DC

## 2018-10-14 ENCOUNTER — Other Ambulatory Visit: Payer: Self-pay | Admitting: *Deleted

## 2018-10-14 DIAGNOSIS — R55 Syncope and collapse: Principal | ICD-10-CM

## 2018-10-14 DIAGNOSIS — R42 Dizziness and giddiness: Secondary | ICD-10-CM

## 2018-10-15 ENCOUNTER — Telehealth: Payer: Self-pay

## 2018-10-15 NOTE — Telephone Encounter (Signed)
Reviewed results from [facility] monitor. Pt expressed understanding, had no further questions at this time.   Confirmed upcoming appt.  Advised pt to call for any further questions or concerns.

## 2018-10-15 NOTE — Telephone Encounter (Signed)
-----   Message from Rise Mu, [place] sent at 10/14/2018 12:56 PM EST ----- Second outpatient cardiac monitor showed NSR with average heart rate of 74 bpm with rare PACs and PVCs with an overall burden less than 1%.  Both monitors are very reassuring.

## 2018-10-15 NOTE — Telephone Encounter (Signed)
Attempted to call patient. LMTCB 10/15/2018

## 2018-10-21 NOTE — Progress Notes (Signed)
Cardiology Office Note Date:  10/22/2018  Patient ID:  Laura, Barber 12/05/1965, MRN [id number] PCP:  Lavera Guise, MD  Cardiologist:  Dr. Fletcher Anon, MD    Chief Complaint: Follow up  History of Present Illness: Laura Barber is a 53 y.o. female with history of palpitations/PVCs, hypertension, hyperlipidemia, anxiety, and GERD who presents for follow-up of dizziness.  Patient was evaluated by Dr. Fletcher Anon on 04/09/2018 for palpitations and PVCs.  Patient reported onset of palpitations over the prior few months that was described as a fluttering sensation mostly at rest and most noticeable at night when she was trying to sleep.  There was associated mild shortness of breath.  No chest pain, dizziness, presyncope, or syncope.  Prior 48-hour Holter monitor showed 2400 PVCs in 48 hours.  Upon cardiology review of the monitor there was noted to be quite a bit of motion artifact which may have interfered with the count.  Cardiology felt her PVC burden was much less than the reported 2400.  She reported a family history remarkable for CAD, though not prematurely.  There was no family history of sudden death.  Echo was obtained on May 01, 2018 to evaluate for structural heart disease and showed an EF of 60 to 65%, no regional wall motion abnormalities, normal LV diastolic function, normal RV cavity size and systolic function.  No significant valvular abnormalities.  Patient was seen in the office on 09/10/2018 noting occasional dizziness/lightheadedness/ear fullness/flushing/sweating and associated fluttering that had gotten worse over the prior week.  It was noted she continued to be able to exercise without issues.  In this setting, she underwent 2 separate 14-day Zio monitors with each showing normal sinus rhythm with isolated PVCs with the first monitor showing an overall burden of 2.4% which was felt to be overestimated secondary to artifact.  The second Zio monitor showed sinus rhythm with rare PACs  and PVCs with an overall burden of less than 1% with rare ventricular couplets/triplets/ventricular bigeminy/trigeminy.  She comes in doing well today.  Since initiating Lopressor 12.5 mg twice daily in 08/2018 she has noted improvement in tachypalpitations and flushing.  She reports her reported heart rate of 171 bpm in the evening of 2/13 was likely in the setting of her exercising with her [facility].  No chest pain, shortness of breath, dizziness, presyncope, or syncope.  She continues to live an active lifestyle and exercise on a regular basis with her [facility] without limitations.  No lower extremity swelling, orthopnea, PND, or early satiety.  No falls since she was last seen.  She is quite pleased with her improvement.  Labs: 08/2018 - TSH normal, CBC unremarkable, magnesium 2.0, serum creatinine 0.82, potassium 4.2 03/2018 -  AST/ALT normal, LDL 103  Past Medical History:  Diagnosis Date  . Anxiety   . Breast mass    left breast cyst  . Cancer (HCC)    Hx cervical cancer  . Cervical cancer, FIGO stage IB1 ([place]) 2005   Cervical  . GERD (gastroesophageal reflux disease)   . Headache   . Hyperlipidemia   . Hypertension   . Migraine     Past Surgical History:  Procedure Laterality Date  . ABDOMINAL HYSTERECTOMY  2005   Radical hysterectomy by Dr Clarene Essex at [facility] cervical cancer 1B1  . BREAST CYST ASPIRATION Left 05/30/2013  . CERVICAL DISC ARTHROPLASTY Right 06/11/2016   Procedure: CERVICAL ANTERIOR [place] ARTHROPLASTY CERVICAL 5-7;  Surgeon: Blanche East, MD;  Location: [facility] ORS;  Service: Neurosurgery;  Laterality: Right;  . DIAGNOSTIC LAPAROSCOPY  2005  . DILATION AND CURETTAGE OF UTERUS     SAB  . 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   Marland Kitchen NASAL SINUS SURGERY      Current Meds  Medication Sig  . ALPRAZolam (XANAX) 0.5 MG tablet Take 1 tablet (0.5 mg total) by mouth at bedtime as needed for anxiety.  Marland Kitchen atorvastatin (LIPITOR) 10 MG  tablet Take 1 tablet (10 mg total) by mouth daily at 6 PM.  . cetirizine (ZYRTEC) 10 MG tablet Take 10 mg by mouth daily.  Marland Kitchen desonide (DESOWEN) 0.05 % cream APPLY A SMALL AMOUNT TO SKIN TWICE DAILY AS NEEDED.  Marland Kitchen estradiol (VIVELLE-DOT) 0.0375 MG/24HR Place 1 patch onto the skin 2 (two) times a week.  . metoprolol tartrate (LOPRESSOR) 25 MG tablet Take 0.5 tablets (12.5 mg total) by mouth 2 (two) times daily.  . Multiple Vitamin (MULTIVITAMIN) tablet Take 1 tablet by mouth daily.  . naproxen sodium (ANAPROX) 550 MG tablet Take 1 tablet (550 mg total) by mouth 2 (two) times daily with a meal.  . omeprazole (PRILOSEC) 20 MG capsule Take 1 capsule (20 mg total) by mouth daily.  . rizatriptan (MAXALT) 5 MG tablet Take 1 tablet (5 mg total) by mouth as needed for migraine.  . valACYclovir (VALTREX) 1000 MG tablet Take 1,000 mg by mouth as needed.  . valsartan-hydrochlorothiazide (DIOVAN-HCT) 80-12.5 MG tablet Take 1 tablet by mouth daily.  Marland Kitchen venlafaxine XR (EFFEXOR-XR) 37.5 MG 24 hr capsule Take 1 capsule (37.5 mg total) by mouth daily with breakfast.    Allergies:   Claritin-d 12 hour [loratadine-pseudoephedrine er] and Sulfa antibiotics   Social History:  The patient  reports that she has never smoked. She has never used smokeless tobacco. She reports current alcohol use. She reports that she does not use drugs.   Family History:  The patient's family history includes Diabetes in her father; Heart attack in her father; Heart disease in her father; Heart failure in her father; Hyperlipidemia in her brother and mother; Hypertension in her father; Lung cancer in her mother.  ROS:   Review of Systems  Constitutional: Negative for chills, diaphoresis, fever, malaise/fatigue and weight loss.  HENT: Negative for congestion.   Eyes: Negative for discharge and redness.  Respiratory: Negative for cough, hemoptysis, sputum production, shortness of breath and wheezing.   Cardiovascular: Positive for  palpitations. Negative for chest pain, orthopnea, claudication, leg swelling and PND.       Much improved.  Gastrointestinal: Negative for abdominal pain, blood in stool, heartburn, melena, nausea and vomiting.  Genitourinary: Negative for hematuria.  Musculoskeletal: Negative for falls and myalgias.  Skin: Negative for rash.  Neurological: Negative for dizziness, tingling, tremors, sensory change, speech change, focal weakness, loss of consciousness and weakness.  Endo/Heme/Allergies: Does not bruise/bleed easily.  Psychiatric/Behavioral: Negative for substance abuse. The patient is nervous/anxious.      PHYSICAL EXAM:  VS:  BP 118/64 (BP Location: Left Arm, Patient Position: Sitting, Cuff Size: Normal)   Pulse 69   Ht 5\' 10"  (1.778 m)   Wt 186 lb 8 oz (84.6 kg)   BMI 26.76 kg/m  BMI: Body mass index is 26.76 kg/m.  Physical Exam  Constitutional: She is oriented to person, place, and time. She appears well-developed and well-nourished.  HENT:  Head: Normocephalic and atraumatic.  Eyes: Right eye exhibits no discharge. Left eye exhibits no discharge.  Neck: Normal range of motion. No JVD  present.  Cardiovascular: Normal rate, regular rhythm, S1 normal, S2 normal and normal heart sounds. Exam reveals no distant heart sounds, no friction rub, no midsystolic click and no opening snap.  No murmur heard. Pulses:      Posterior tibial pulses are 2+ on the right side and 2+ on the left side.  Pulmonary/Chest: Effort normal and breath sounds normal. No respiratory distress. She has no decreased breath sounds. She has no wheezes. She has no rales. She exhibits no tenderness.  Abdominal: Soft. She exhibits no distension. There is no abdominal tenderness.  Musculoskeletal:        General: No edema.  Neurological: She is alert and oriented to person, place, and time.  Skin: Skin is warm and dry. No cyanosis. Nails show no clubbing.  Psychiatric: She has a normal mood and affect. Her speech is  normal and behavior is normal. Judgment and thought content normal.     EKG:  Was not ordered today.  Recent Labs: 03/29/2018: ALT 21 09/10/2018: BUN 18; Creatinine, Ser 0.82; Hemoglobin 13.9; Magnesium 2.0; Platelets 221; Potassium 4.2; Sodium 140; TSH 1.450  03/29/2018: Cholesterol, Total 196; HDL 64; LDL Calculated 103; Triglycerides 147   CrCl cannot be calculated (Patient's most recent lab result is older than the maximum 21 days allowed.).   Wt Readings from Last 3 Encounters:  10/22/18 186 lb 8 oz (84.6 kg)  09/10/18 186 lb 8 oz (84.6 kg)  08/30/18 185 lb (83.9 kg)     Other studies reviewed: Additional studies/records reviewed today include: summarized above  ASSESSMENT AND PLAN:  1. PVCs/palpitations: Much improved.  Recent outpatient cardiac monitoring x2 showed sinus rhythm with occasional PVCs, rare ventricular couplet, rare ventricular triplet, ventricular bigeminy/trigeminy.  Recent echo and 03/2018 demonstrated preserved LV systolic function without structural abnormalities.  Continue Lopressor 12.5 mg twice daily.  Schedule ETT given ventricular ectopy noted on outpatient cardiac monitoring.  2. Hypertension: Blood pressure is well controlled.  Disposition: F/u with Dr. Fletcher Anon or an APP in 6 months, sooner if needed.  Current medicines are reviewed at length with the patient today.  The patient did not have any concerns regarding medicines.  Signed, Christell Faith, PA-C 10/22/2018 3:11 PM     [place], [place] [address] [phone]

## 2018-10-22 ENCOUNTER — Ambulatory Visit: Payer: BLUE CROSS/BLUE SHIELD | Admitting: Physician Assistant

## 2018-10-22 ENCOUNTER — Encounter: Payer: Self-pay | Admitting: Physician Assistant

## 2018-10-22 VITALS — BP 118/64 | HR 69 | Ht 70.0 in | Wt 186.5 lb

## 2018-10-22 DIAGNOSIS — I10 Essential (primary) hypertension: Secondary | ICD-10-CM | POA: Diagnosis not present

## 2018-10-22 DIAGNOSIS — I493 Ventricular premature depolarization: Secondary | ICD-10-CM

## 2018-10-22 DIAGNOSIS — R002 Palpitations: Secondary | ICD-10-CM | POA: Diagnosis not present

## 2018-10-22 NOTE — Patient Instructions (Signed)
Medication Instructions:  Your physician recommends that you continue on your current medications as directed. Please refer to the Current Medication list given to you today.  If you need a refill on your cardiac medications before your next appointment, please call your pharmacy.   Lab work: None ordered  If you have labs (blood work) drawn today and your tests are completely normal, you will receive your results only by: Marland Kitchen MyChart Message (if you have MyChart) OR . A paper copy in the mail If you have any lab test that is abnormal or we need to change your treatment, we will call you to review the results.  Testing/Procedures: 1- Exercise Stress Test  Your physician has requested that you have an exercise tolerance test. For further information please visit [email or web address]. Please also follow instruction sheet, as given. Exercise Stress Test   An exercise stress test is a test to check how your heart works during exercise and checks for coronary artery disease.  Your heart rate will be watched while you are resting and while you are exercising.   Patches (electrodes) will be put on your chest. Do not put lotions, powders, creams, or oils on your chest before the test. Wires will be connected to the patches. The wires will send signals to a machine to record your heartbeat. Wear comfortable shoes and clothing.  Follow instructions from your doctor about what you cannot eat or drink before the test.  Before the procedure  Follow instructions from your doctor about what you cannot eat or drink. ? Do not have any drinks or foods that have caffeine in them for 24 hours before the test, or as told by your doctor. This includes coffee, tea (even decaf tea), sodas, chocolate, and cocoa. ? Hold beta-blocker medicines for night before and the morning of. Take all other BP meds that aren't Bbs.   If you use an inhaler, bring it with you to the test.  Do not use any products that have  nicotine or tobacco in them, such as cigarettes and e-cigarettes. Stop using them at least 4 hours before the test. If you need help quitting, ask your doctor. Date:_______________________ Time:___________________________   Follow-Up: At [facility], you and your health needs are our priority.  As part of our continuing mission to provide you with exceptional heart care, we have created designated Provider Care Teams.  These Care Teams include your primary Cardiologist (physician) and Advanced Practice Providers (APPs -  Physician Assistants and Nurse Practitioners) who all work together to provide you with the care you need, when you need it. You will need a follow up appointment in 6 months.  Please call our office 2 months in advance to schedule this appointment.  You may see Dr. Fletcher Anon or Christell Faith, PA-C

## 2018-10-29 ENCOUNTER — Ambulatory Visit (INDEPENDENT_AMBULATORY_CARE_PROVIDER_SITE_OTHER): Payer: BLUE CROSS/BLUE SHIELD

## 2018-10-29 DIAGNOSIS — R002 Palpitations: Secondary | ICD-10-CM | POA: Diagnosis not present

## 2018-11-04 ENCOUNTER — Telehealth: Payer: Self-pay | Admitting: *Deleted

## 2018-11-04 ENCOUNTER — Other Ambulatory Visit: Payer: Self-pay | Admitting: Adult Health

## 2018-11-04 DIAGNOSIS — Z1231 Encounter for screening mammogram for malignant neoplasm of breast: Secondary | ICD-10-CM

## 2018-11-04 LAB — EXERCISE TOLERANCE TEST
CHL CUP MPHR: 169 {beats}/min
CSEPPHR: 146 {beats}/min
Estimated workload: 9.5 METS
Exercise duration (min): 7 min
Exercise duration (sec): 38 s
Percent HR: 86 %
Rest HR: 70 {beats}/min

## 2018-11-04 NOTE — Telephone Encounter (Signed)
Left a message for the patient to call back.  

## 2018-11-04 NOTE — Telephone Encounter (Signed)
-----   Message from Rise Mu, PA-C sent at 11/04/2018  3:54 PM EDT ----- Treadmill stress test showed no evidence of ischemia with average exercise capacity.  Reassuring.

## 2018-11-08 NOTE — Telephone Encounter (Signed)
Results called to pt. Pt verbalized understanding.  

## 2018-11-29 ENCOUNTER — Ambulatory Visit: Payer: BLUE CROSS/BLUE SHIELD | Admitting: Adult Health

## 2018-11-29 ENCOUNTER — Encounter: Payer: Self-pay | Admitting: Adult Health

## 2018-11-29 ENCOUNTER — Other Ambulatory Visit: Payer: Self-pay

## 2018-11-29 VITALS — Resp 16 | Ht 71.0 in | Wt 177.0 lb

## 2018-11-29 DIAGNOSIS — F411 Generalized anxiety disorder: Secondary | ICD-10-CM | POA: Diagnosis not present

## 2018-11-29 DIAGNOSIS — I10 Essential (primary) hypertension: Secondary | ICD-10-CM

## 2018-11-29 MED ORDER — VALSARTAN-HYDROCHLOROTHIAZIDE 80-12.5 MG PO TABS: 1.0000 | ORAL_TABLET | Freq: Every day | ORAL | 5 refills | Status: DC

## 2018-11-29 MED ORDER — VENLAFAXINE HCL ER 37.5 MG PO CP24: 37.5000 mg | ORAL_CAPSULE | Freq: Every day | ORAL | 4 refills | Status: DC

## 2018-11-29 NOTE — Progress Notes (Signed)
[facility] [place], [place] [address]  Internal MEDICINE  Telephone Visit  Patient Name: Laura Barber  [id number]  [id number]  Date of Service: 11/29/2018  I connected with the patient at 1635 by telephone and verified the patients identity using two identifiers.  I discussed the limitations, risks, security and privacy concerns of performing an evaluation and management service by telephone and the availability of in person appointments. I also discussed with the patient that there may be a patient responsible charge related to the service.  The patient expressed understanding and agrees to proceed.    Chief Complaint  Patient presents with  . Telephone Screen    3 month follow up, medication refills   . Anxiety  . Depression  . Hypertension  . Telephone Assessment    HPI  PT has follow up for anxiety, depression, and HTN.  PT reports her anxiety and depression are doing well at this time.  She has continued to use her medications as prescribed, and denies any new or worsening symptoms.    Current Medication: Outpatient Encounter Medications as of 11/29/2018  Medication Sig  . ALPRAZolam (XANAX) 0.5 MG tablet Take 1 tablet (0.5 mg total) by mouth at bedtime as needed for anxiety.  Marland Kitchen atorvastatin (LIPITOR) 10 MG tablet Take 1 tablet (10 mg total) by mouth daily at 6 PM.  . cetirizine (ZYRTEC) 10 MG tablet Take 10 mg by mouth daily.  Marland Kitchen desonide (DESOWEN) 0.05 % cream APPLY A SMALL AMOUNT TO SKIN TWICE DAILY AS NEEDED.  Marland Kitchen estradiol (VIVELLE-DOT) 0.0375 MG/24HR Place 1 patch onto the skin 2 (two) times a week.  . metoprolol tartrate (LOPRESSOR) 25 MG tablet Take 0.5 tablets (12.5 mg total) by mouth 2 (two) times daily.  . Multiple Vitamin (MULTIVITAMIN) tablet Take 1 tablet by mouth daily.  . naproxen sodium (ANAPROX) 550 MG tablet Take 1 tablet (550 mg total) by mouth 2 (two) times daily with a meal.  . omeprazole (PRILOSEC) 20 MG capsule Take 1 capsule  (20 mg total) by mouth daily.  . rizatriptan (MAXALT) 5 MG tablet Take 1 tablet (5 mg total) by mouth as needed for migraine.  . valACYclovir (VALTREX) 1000 MG tablet Take 1,000 mg by mouth as needed.  . valsartan-hydrochlorothiazide (DIOVAN-HCT) 80-12.5 MG tablet Take 1 tablet by mouth daily.  Marland Kitchen venlafaxine XR (EFFEXOR-XR) 37.5 MG 24 hr capsule Take 1 capsule (37.5 mg total) by mouth daily with breakfast.   No facility-administered encounter medications on file as of 11/29/2018.     Surgical History: Past Surgical History:  Procedure Laterality Date  . ABDOMINAL HYSTERECTOMY  2005   Radical hysterectomy by Dr Clarene Essex at [facility] cervical cancer 1B1  . BREAST CYST ASPIRATION Left 05/30/2013  . CERVICAL DISC ARTHROPLASTY Right 06/11/2016   Procedure: CERVICAL ANTERIOR [place] ARTHROPLASTY CERVICAL 5-7;  Surgeon: Blanche East, MD;  Location: [facility] ORS;  Service: Neurosurgery;  Laterality: Right;  . DIAGNOSTIC LAPAROSCOPY  2005  . DILATION AND CURETTAGE OF UTERUS     SAB  . 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   Marland Kitchen NASAL SINUS SURGERY      Medical History: Past Medical History:  Diagnosis Date  . Anxiety   . Breast mass    left breast cyst  . Cancer (HCC)    Hx cervical cancer  . Cervical cancer, FIGO stage IB1 ([place]) 2005   Cervical  . GERD (gastroesophageal reflux disease)   . Headache   .  Hyperlipidemia   . Hypertension   . Migraine     Family History: Family History  Problem Relation Age of Onset  . Lung cancer Mother   . Hyperlipidemia Mother   . Diabetes Father   . Hypertension Father   . Heart failure Father   . Heart attack Father   . Heart disease Father   . Hyperlipidemia Brother   . Breast cancer Neg Hx     Social History   Socioeconomic History  . Marital status: Divorced    Spouse name: Not on file  . Number of children: 0  . Years of education: Not on file  . Highest education level: Not on file  Occupational  History  . Not on file  Social Needs  . Financial resource strain: Not on file  . Food insecurity:    Worry: Not on file    Inability: Not on file  . Transportation needs:    Medical: Not on file    Non-medical: Not on file  Tobacco Use  . Smoking status: Never Smoker  . Smokeless tobacco: Never Used  Substance and Sexual Activity  . Alcohol use: Yes    Comment: occassional  . Drug use: No  . Sexual activity: Yes    Partners: Male    Birth control/protection: Surgical  Lifestyle  . Physical activity:    Days per week: Not on file    Minutes per session: Not on file  . Stress: Not on file  Relationships  . Social connections:    Talks on phone: Not on file    Gets together: Not on file    Attends religious service: Not on file    Active member of club or organization: Not on file    Attends meetings of clubs or organizations: Not on file    Relationship status: Not on file  . Intimate partner violence:    Fear of current or ex partner: Not on file    Emotionally abused: Not on file    Physically abused: Not on file    Forced sexual activity: Not on file  Other Topics Concern  . Not on file  Social History Narrative  . Not on file      Review of Systems  Constitutional: Negative for chills, fatigue and unexpected weight change.  HENT: Negative for congestion, rhinorrhea, sneezing and sore throat.   Eyes: Negative for photophobia, pain and redness.  Respiratory: Negative for cough, chest tightness and shortness of breath.   Cardiovascular: Negative for chest pain and palpitations.  Gastrointestinal: Negative for abdominal pain, constipation, diarrhea, nausea and vomiting.  Endocrine: Negative.   Genitourinary: Negative for dysuria and frequency.  Musculoskeletal: Negative for arthralgias, back pain, joint swelling and neck pain.  Skin: Negative for rash.  Allergic/Immunologic: Negative.   Neurological: Negative for tremors and numbness.  Hematological: Negative  for adenopathy. Does not bruise/bleed easily.  Psychiatric/Behavioral: Negative for behavioral problems and sleep disturbance. The patient is not nervous/anxious.     Vital Signs: Resp 16   Ht 5\' 11"  (1.803 m)   Wt 177 lb (80.3 kg)   BMI 24.69 kg/m    Observation/Objective: PT appears comfortable, NAD noted.    Assessment/Plan: 1. Hypertension, unspecified type Stable, continue present management - valsartan-hydrochlorothiazide (DIOVAN-HCT) 80-12.5 MG tablet; Take 1 tablet by mouth daily.  Dispense: 30 tablet; Refill: 5  2. Generalized anxiety disorder Stable continue present medication therapy.  - venlafaxine XR (EFFEXOR-XR) 37.5 MG 24 hr capsule; Take 1 capsule (37.5  mg total) by mouth daily with breakfast.  Dispense: 30 capsule; Refill: 4  General Counseling: Shaleen verbalizes understanding of the findings of today's phone visit and agrees with plan of treatment. I have discussed any further diagnostic evaluation that may be needed or ordered today. We also reviewed her medications today. she has been encouraged to call the office with any questions or concerns that should arise related to todays visit.    No orders of the defined types were placed in this encounter.   No orders of the defined types were placed in this encounter.   Time spent: [place] AGNP-C Internal medicine

## 2018-11-29 NOTE — Patient Instructions (Signed)

## 2018-12-01 ENCOUNTER — Ambulatory Visit: Payer: BLUE CROSS/BLUE SHIELD | Admitting: Certified Nurse Midwife

## 2018-12-09 ENCOUNTER — Other Ambulatory Visit: Payer: Self-pay | Admitting: Adult Health

## 2018-12-09 DIAGNOSIS — I10 Essential (primary) hypertension: Secondary | ICD-10-CM

## 2019-01-07 ENCOUNTER — Other Ambulatory Visit: Payer: Self-pay | Admitting: Adult Health

## 2019-01-07 MED ORDER — VALACYCLOVIR HCL 1 G PO TABS: 1000.0000 mg | ORAL_TABLET | ORAL | 2 refills | Status: DC | PRN

## 2019-01-25 ENCOUNTER — Ambulatory Visit
Admission: RE | Admit: 2019-01-25 | Discharge: 2019-01-25 | Disposition: A | Discharge status: Home / Self Care | Payer: BC Managed Care – PPO | Source: Ambulatory Visit | Attending: Adult Health | Admitting: Adult Health

## 2019-01-25 ENCOUNTER — Other Ambulatory Visit (HOSPITAL_COMMUNITY)
Admission: RE | Admit: 2019-01-25 | Discharge: 2019-01-25 | Disposition: A | Discharge status: Home / Self Care | Payer: BC Managed Care – PPO | Source: Ambulatory Visit | Attending: Certified Nurse Midwife | Admitting: Certified Nurse Midwife

## 2019-01-25 ENCOUNTER — Encounter: Payer: Self-pay | Admitting: Certified Nurse Midwife

## 2019-01-25 ENCOUNTER — Ambulatory Visit (INDEPENDENT_AMBULATORY_CARE_PROVIDER_SITE_OTHER): Payer: BLUE CROSS/BLUE SHIELD | Admitting: Certified Nurse Midwife

## 2019-01-25 ENCOUNTER — Other Ambulatory Visit: Payer: Self-pay

## 2019-01-25 VITALS — BP 100/60 | Ht 70.0 in | Wt 189.0 lb

## 2019-01-25 DIAGNOSIS — C539 Malignant neoplasm of cervix uteri, unspecified: Secondary | ICD-10-CM | POA: Diagnosis not present

## 2019-01-25 DIAGNOSIS — I10 Essential (primary) hypertension: Secondary | ICD-10-CM | POA: Insufficient documentation

## 2019-01-25 DIAGNOSIS — Z01419 Encounter for gynecological examination (general) (routine) without abnormal findings: Secondary | ICD-10-CM

## 2019-01-25 DIAGNOSIS — Z79890 Hormone replacement therapy (postmenopausal): Secondary | ICD-10-CM

## 2019-01-25 DIAGNOSIS — E785 Hyperlipidemia, unspecified: Secondary | ICD-10-CM | POA: Insufficient documentation

## 2019-01-25 DIAGNOSIS — K219 Gastro-esophageal reflux disease without esophagitis: Secondary | ICD-10-CM | POA: Insufficient documentation

## 2019-01-25 DIAGNOSIS — Z1239 Encounter for other screening for malignant neoplasm of breast: Secondary | ICD-10-CM

## 2019-01-25 DIAGNOSIS — Z1231 Encounter for screening mammogram for malignant neoplasm of breast: Secondary | ICD-10-CM | POA: Diagnosis not present

## 2019-01-25 DIAGNOSIS — R002 Palpitations: Secondary | ICD-10-CM | POA: Insufficient documentation

## 2019-01-25 DIAGNOSIS — F419 Anxiety disorder, unspecified: Secondary | ICD-10-CM | POA: Insufficient documentation

## 2019-01-25 DIAGNOSIS — Z1272 Encounter for screening for malignant neoplasm of vagina: Secondary | ICD-10-CM

## 2019-01-25 MED ORDER — ESTRADIOL 0.0375 MG/24HR TD PTTW: 1.0000 | MEDICATED_PATCH | TRANSDERMAL | 3 refills | Status: DC

## 2019-01-25 NOTE — Progress Notes (Signed)
Gynecology Annual Exam  PCP: Lavera Guise, MD  Chief Complaint:  Chief Complaint  Patient presents with  . Gynecologic Exam    History of Present Illness:Laura Barber is a 53 year old Caucasian/White female, G1 P0010, who presents for her annual exam. She is not having any significant gyn problems. Her Vivelle Dot 0.0375 patch has greatly reduced her hot flashes and night sweats. Her menses are absent due to a radical hysterectomy for invasive cervical cancer in 2005  She has had no spotting.   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  Since her last annual GYN exam dated 08/29/2016, she has had some increased problems with palpitations. Had a cardiac workup including a echocardiogram and Holter. She was begun on metaprolol which has reduced her palpitations. She also was diagnosed with shingles last year.  She is sexually active.  Her most recent pap smear was obtained 11/03/2017 and was NIL with negative HRHPV. Had some abnormal Pap smears in 2017, NIL/positive HRHPV, and in  05/2014, LGSIL and positive HRHPV. Dr Laurey Morale did a colpo in 2015 which was negative and she was also seen by Dr Clarene Essex who also did a colposcopy in 2015 which was also negative.   Her most recent mammogram obtained today was [place] 1 She had a colonoscopy 2019 which was normal. Next colonoscopy due in 2029. There is no family history of breast cancer.  There is no family history of ovarian cancer.  The patient does do occasional self breast exams.  The patient does not smoke.  The patient does drink alcohol occasionally.  The patient does not use illegal drugs.  The patient exercises regularly since starting a [employer] 4 weeks ago. The patient does not get adequate calcium in her diet.  She had a recent cholesterol screen by PCP at  [facility] in 2018 that was borderline.   The patient denies current symptoms of depression.    Review of Systems: Review of Systems  Constitutional: Negative for chills, fever and weight loss.  HENT: Negative for congestion, sinus pain and sore throat.   Eyes: Negative for blurred vision and pain.  Respiratory: Negative for hemoptysis, shortness of breath and wheezing.   Cardiovascular: Positive for palpitations. Negative for chest pain and leg swelling.  Gastrointestinal: Negative for abdominal pain, blood in stool, diarrhea, heartburn, nausea and vomiting.  Genitourinary: Negative for dysuria, frequency, hematuria and urgency.  Musculoskeletal: Negative for back pain, joint pain and myalgias.  Skin: Negative for itching and rash.  Neurological: Negative for dizziness, tingling and headaches.  Endo/Heme/Allergies: Positive for environmental allergies (sneezing and coughing). Negative for polydipsia. Does not bruise/bleed easily.       Negative for hirsutism   Psychiatric/Behavioral: Negative for depression. The patient is not nervous/anxious and does not have insomnia.   All other systems reviewed and are negative.   Past Medical History:  Past Medical History:  Diagnosis Date  . Anxiety   . Cancer (HCC)    Hx cervical cancer  . Cervical cancer, FIGO stage IB1 ([place]) 2005   Cervical  . GERD (gastroesophageal reflux disease)   . Headache   . Hyperlipidemia   . Hypertension   . Migraine     Past Surgical History:  Past Surgical History:  Procedure Laterality Date  . ABDOMINAL HYSTERECTOMY  2005  Radical hysterectomy by Dr Clarene Essex at [facility] cervical cancer 1B1  . BREAST CYST ASPIRATION Left 05/30/2013  . CERVICAL DISC ARTHROPLASTY Right 06/11/2016   Procedure: CERVICAL ANTERIOR [place] ARTHROPLASTY CERVICAL 5-7;  Surgeon: Blanche East, MD;  Location: [facility] ORS;  Service: Neurosurgery;  Laterality: Right;  . DIAGNOSTIC LAPAROSCOPY  2005  . DILATION AND CURETTAGE  OF UTERUS     SAB  . 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   Marland Kitchen NASAL SINUS SURGERY      Family History:  Family History  Problem Relation Age of Onset  . Lung cancer Mother   . Hyperlipidemia Mother   . Diabetes Father   . Hypertension Father   . Heart failure Father   . Heart attack Father   . Heart disease Father   . Hyperlipidemia Brother   . Breast cancer Neg Hx     Social History:  Social History   Socioeconomic History  . Marital status: Divorced    Spouse name: Not on file  . Number of children: 0  . Years of education: Not on file  . Highest education level: Not on file  Occupational History  . Not on file  Social Needs  . Financial resource strain: Not on file  . Food insecurity:    Worry: Not on file    Inability: Not on file  . Transportation needs:    Medical: Not on file    Non-medical: Not on file  Tobacco Use  . Smoking status: Never Smoker  . Smokeless tobacco: Never Used  Substance and Sexual Activity  . Alcohol use: Yes    Comment: occassional  . Drug use: No  . Sexual activity: Yes    Partners: Male    Birth control/protection: Surgical  Lifestyle  . Physical activity:    Days per week: Not on file    Minutes per session: Not on file  . Stress: Not on file  Relationships  . Social connections:    Talks on phone: Not on file    Gets together: Not on file    Attends religious service: Not on file    Active member of club or organization: Not on file    Attends meetings of clubs or organizations: Not on file    Relationship status: Not on file  . Intimate partner violence:    Fear of current or ex partner: Not on file    Emotionally abused: Not on file    Physically abused: Not on file    Forced sexual activity: Not on file  Other Topics Concern  . Not on file  Social History Narrative  . Not on file    Allergies:  Allergies  Allergen Reactions  . Claritin-D 12 Hour [Loratadine-Pseudoephedrine Er]  Itching and Swelling  . Sulfa Antibiotics Itching and Swelling    Medications:  Current Outpatient Medications on File Prior to Visit  Medication Sig Dispense Refill  . ALPRAZolam (XANAX) 0.5 MG tablet Take 1 tablet (0.5 mg total) by mouth at bedtime as needed for anxiety. 30 tablet 1  . atorvastatin (LIPITOR) 10 MG tablet Take 1 tablet (10 mg total) by mouth daily at 6 PM. 30 tablet 5  . cetirizine (ZYRTEC) 10 MG tablet Take 10 mg by mouth daily.    Marland Kitchen desonide (DESOWEN) 0.05 % cream APPLY A SMALL AMOUNT TO SKIN TWICE DAILY AS NEEDED. 30 g 3  . estradiol (VIVELLE-DOT) 0.0375 MG/24HR Place 1 patch onto the skin 2 (two)  times a week. 24 patch 2  . Multiple Vitamin (MULTIVITAMIN) tablet Take 1 tablet by mouth daily.    . naproxen sodium (ANAPROX) 550 MG tablet Take 1 tablet (550 mg total) by mouth 2 (two) times daily with a meal. 60 tablet 2  . omeprazole (PRILOSEC) 20 MG capsule Take 1 capsule (20 mg total) by mouth daily. 30 capsule 3  . rizatriptan (MAXALT) 5 MG tablet Take 1 tablet (5 mg total) by mouth as needed for migraine. 10 tablet 2  . valACYclovir (VALTREX) 1000 MG tablet Take 1 tablet (1,000 mg total) by mouth as needed. 30 tablet 2  . valsartan-hydrochlorothiazide (DIOVAN-HCT) 80-12.5 MG tablet TAKE ONE (1) TABLET BY MOUTH ONCE DAILY 30 tablet 5  . venlafaxine XR (EFFEXOR-XR) 37.5 MG 24 hr capsule Take 1 capsule (37.5 mg total) by mouth daily with breakfast. 30 capsule 4  . metoprolol tartrate (LOPRESSOR) 25 MG tablet Take 0.5 tablets (12.5 mg total) by mouth 2 (two) times daily. 90 tablet 3   No current facility-administered medications on file prior to visit.    Physical Exam Vitals: BP 100/60   Ht 5\' 10"  (1.778 m)   Wt 189 lb (85.7 kg)   BMI 27.12 kg/m   General:WF in  NAD HEENT: normocephalic, anicteric Neck: no thyroid enlargement, no palpable nodules, no cervical lymphadenopathy  Pulmonary: No increased work of breathing, CTAB Cardiovascular: RRR, without murmur   Breast: Breast symmetrical, no tenderness, no palpable nodules or masses, no skin or nipple retraction present, no nipple discharge.  No axillary, infraclavicular or supraclavicular lymphadenopathy. Abdomen: Soft, non-tender, non-distended.  Umbilicus without lesions.  No hepatomegaly or masses palpable. No evidence of hernia. Genitourinary:  External: Normal external female genitalia.  Normal urethral meatus, normal Bartholin's and Skene's glands.    Vagina: Normal vaginal mucosa, no evidence of prolapse, white mucoepithelial discharge  Cervix: surgically absent  Uterus: surgically absent  Adnexa: No adnexal masses, non-tender  Rectal: deferred  Lymphatic: no evidence of inguinal lymphadenopathy Extremities: no edema, erythema, or tenderness Neurologic: Grossly intact Psychiatric: mood appropriate, affect full     Assessment: 53 y.o. G1P0010 annual gyn exam  H/O TAH for cervical cancer Menopausal symptoms relieved with ET.  Plan:   1) Breast cancer screening - recommend monthly self breast exam and annual screening mammograms. Mammogram done today.   2)Refill of Vivelle Dot 0.0375 #8/RF x11  3) Vaginal cancer screening - Pap was done. ASCCP guidelines and rational discussed.  Patient opts for yearly screening interval due to history of cervical cancer  4)Colon cancer screening: UTD. Next colonoscopy due 2029  5) Routine healthcare maintenance including cholesterol and diabetes screening managed by PCP. Discussed calcium and vitamin D3 requirements and the role of exercise in preventing osteoporosis  6) RTO in 1 year and prn.  Dalia Heading, CNM

## 2019-01-27 LAB — CYTOLOGY - PAP
Diagnosis: NEGATIVE
HPV: NOT DETECTED

## 2019-02-08 ENCOUNTER — Other Ambulatory Visit: Payer: Self-pay | Admitting: Certified Nurse Midwife

## 2019-05-11 ENCOUNTER — Other Ambulatory Visit: Payer: Self-pay | Admitting: Adult Health

## 2019-05-30 ENCOUNTER — Other Ambulatory Visit: Payer: Self-pay | Admitting: Adult Health

## 2019-05-30 DIAGNOSIS — F411 Generalized anxiety disorder: Secondary | ICD-10-CM

## 2019-06-29 ENCOUNTER — Other Ambulatory Visit: Payer: Self-pay | Admitting: Physician Assistant

## 2019-07-15 ENCOUNTER — Other Ambulatory Visit: Payer: Self-pay

## 2019-07-15 ENCOUNTER — Ambulatory Visit
Admission: EM | Admit: 2019-07-15 | Discharge: 2019-07-15 | Disposition: A | Discharge status: Home / Self Care | Payer: BC Managed Care – PPO | Attending: Emergency Medicine | Admitting: Emergency Medicine

## 2019-07-15 ENCOUNTER — Encounter: Payer: Self-pay | Admitting: Emergency Medicine

## 2019-07-15 DIAGNOSIS — R0981 Nasal congestion: Secondary | ICD-10-CM

## 2019-07-15 DIAGNOSIS — R519 Headache, unspecified: Secondary | ICD-10-CM | POA: Diagnosis not present

## 2019-07-15 DIAGNOSIS — J029 Acute pharyngitis, unspecified: Secondary | ICD-10-CM | POA: Diagnosis not present

## 2019-07-15 DIAGNOSIS — Z20828 Contact with and (suspected) exposure to other viral communicable diseases: Secondary | ICD-10-CM | POA: Diagnosis not present

## 2019-07-15 DIAGNOSIS — Z20822 Contact with and (suspected) exposure to covid-19: Secondary | ICD-10-CM

## 2019-07-15 LAB — SARS CORONAVIRUS 2 AG (30 MIN TAT): SARS Coronavirus 2 Ag: NEGATIVE

## 2019-07-15 LAB — RAPID STREP SCREEN (MED CTR MEBANE ONLY): Streptococcus, Group A Screen (Direct): NEGATIVE

## 2019-07-15 MED ORDER — FLUTICASONE PROPIONATE 50 MCG/ACT NA SUSP: 2.0000 | Freq: Every day | NASAL | 0 refills | Status: DC

## 2019-07-15 NOTE — ED Triage Notes (Signed)
Patient c/o sore throat that started on Tuesday.  Patient states that she was around a friend that tested positive for COVID on Tuesday.  Patient denies fevers. Patient c/o bilateral ear pain that started yesterday.

## 2019-07-15 NOTE — ED Provider Notes (Signed)
HPI  SUBJECTIVE:  Laura Barber is a 53 y.o. female who presents with 4 days of a sore throat.  She reports headache, nasal congestion, rhinorrhea.  She was exposed to her best friend who recently tested positive for Covid 5 days ago.  She denies fevers, body aches, postnasal drip, loss of sense of smell or taste, cough, shortness of breath, nausea, vomiting, diarrhea, abdominal pain.  She also reports bilateral ear pain starting today.  No change in hearing, otorrhea.  No rash, neck stiffness, drooling, trismus, sensation of throat swelling, difficulty breathing, GERD or allergy symptoms.  No exposure to strep.  No antibiotics in the past month.  No antipyretic in the past 4 to 6 hours.  She has been taking Tylenol Sinus with improvement in her symptoms.  No aggravating factors.  Past medical history of hypertension, hypercholesterolemia, cervical cancer 15 years ago status post hysterectomy.  No history of diabetes, chronic kidney disease, pulmonary disease, HIV, immunocompromise.  PMD: [facility].    Past Medical History:  Diagnosis Date  . Anxiety   . Cancer (HCC)    Hx cervical cancer  . Cervical cancer, FIGO stage IB1 ([place]) 2005   Cervical  . GERD (gastroesophageal reflux disease)   . Headache   . Hyperlipidemia   . Hypertension   . Migraine   . Shingles 2019    Past Surgical History:  Procedure Laterality Date  . ABDOMINAL HYSTERECTOMY  2005   Radical hysterectomy by Dr Clarene Essex at [facility] cervical cancer 1B1  . BREAST CYST ASPIRATION Left 05/30/2013  . CERVICAL DISC ARTHROPLASTY Right 06/11/2016   Procedure: CERVICAL ANTERIOR [place] ARTHROPLASTY CERVICAL 5-7;  Surgeon: Blanche East, MD;  Location: [facility] ORS;  Service: Neurosurgery;  Laterality: Right;  . DIAGNOSTIC LAPAROSCOPY  2005  . DILATION AND CURETTAGE OF UTERUS     SAB  . 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   Marland Kitchen NASAL SINUS SURGERY      Family History   Problem Relation Age of Onset  . Lung cancer Mother   . Hyperlipidemia Mother   . Diabetes Father   . Hypertension Father   . Heart failure Father   . Heart attack Father   . Heart disease Father   . Hyperlipidemia Brother   . Breast cancer Neg Hx     Social History   Tobacco Use  . Smoking status: Never Smoker  . Smokeless tobacco: Never Used  Substance Use Topics  . Alcohol use: Yes    Comment: occassional  . Drug use: No    No current facility-administered medications for this encounter.   Current Outpatient Medications:  .  ALPRAZolam (XANAX) 0.5 MG tablet, Take 1 tablet (0.5 mg total) by mouth at bedtime as needed for anxiety., Disp: 30 tablet, Rfl: 1 .  atorvastatin (LIPITOR) 10 MG tablet, TAKE ONE (1) TABLET BY MOUTH ONCE DAILY AT 6 IN THE EVENING, Disp: 30 tablet, Rfl: 5 .  estradiol (VIVELLE-DOT) 0.0375 MG/24HR, Place 1 patch onto the skin 2 (two) times a week., Disp: 24 patch, Rfl: 3 .  metoprolol tartrate (LOPRESSOR) 25 MG tablet, TAKE 1/2 TABLET BY MOUTH TWICE DAILY, Disp: 90 tablet, Rfl: 3 .  Multiple Vitamin (MULTIVITAMIN) tablet, Take 1 tablet by mouth daily., Disp: , Rfl:  .  naproxen sodium (ANAPROX) 550 MG tablet, Take 1 tablet (550 mg total) by mouth 2 (two) times daily with a meal., Disp: 60 tablet, Rfl: 2 .  omeprazole (PRILOSEC) 20 MG  capsule, Take 1 capsule (20 mg total) by mouth daily., Disp: 30 capsule, Rfl: 3 .  rizatriptan (MAXALT) 5 MG tablet, Take 1 tablet (5 mg total) by mouth as needed for migraine., Disp: 10 tablet, Rfl: 2 .  valsartan-hydrochlorothiazide (DIOVAN-HCT) 80-12.5 MG tablet, TAKE ONE (1) TABLET BY MOUTH ONCE DAILY, Disp: 30 tablet, Rfl: 5 .  venlafaxine XR (EFFEXOR-XR) 37.5 MG 24 hr capsule, TAKE (1) CAPSULE BY MOUTH EVERY DAY WITHBREAKFAST, Disp: 30 capsule, Rfl: 4 .  desonide (DESOWEN) 0.05 % cream, APPLY A SMALL AMOUNT TO SKIN TWICE DAILY AS NEEDED., Disp: 30 g, Rfl: 3 .  fluticasone (FLONASE) 50 MCG/ACT nasal spray, Place 2 sprays  into both nostrils daily., Disp: 16 g, Rfl: 0 .  valACYclovir (VALTREX) 1000 MG tablet, Take 1 tablet (1,000 mg total) by mouth as needed., Disp: 30 tablet, Rfl: 2  Allergies  Allergen Reactions  . Claritin-D 12 Hour [Loratadine-Pseudoephedrine Er] Itching and Swelling  . Sulfa Antibiotics Itching and Swelling     ROS  As noted in HPI.   Physical Exam  BP (!) 119/56 (BP Location: Left Arm)   Pulse 61   Temp 98.2 F (36.8 C) (Oral)   Resp 16   Ht _0  (1.778 m)   Wt 81.6 kg   SpO2 100%   BMI 25.83 kg/m   Constitutional: Well developed, well nourished, no acute distress Eyes:  EOMI, conjunctiva normal bilaterally HENT: Normocephalic, atraumatic,mucus membranes moist no appreciable nasal congestion.  Normal turbinates.  Bilateral TMs normal.  No sinus tenderness.  Normal oropharynx with normal tonsils.  No exudates.  Uvula midline.  No obvious postnasal drip. Neck: No cervical lymphadenopathy Respiratory: Normal inspiratory effort, lungs clear bilaterally Cardiovascular: Normal rate regular rhythm no murmurs rubs or gallops GI: nondistended soft, nontender, no splenomegaly skin: No rash, skin intact Musculoskeletal: no deformities Neurologic: Alert & oriented x 3, no focal neuro deficits Psychiatric: Speech and behavior appropriate   ED Course   Medications - No data to display  Orders Placed This Encounter  Procedures  . Rapid Strep Screen ([facility] ONLY)    Standing Status:   Standing    Number of Occurrences:   1  . SARS Coronavirus 2 Ag (30 min TAT) - Nasal Swab (BD Veritor Kit)    Standing Status:   Standing    Number of Occurrences:   1    Order Specific Question:   Is this test for diagnosis or screening    Answer:   Diagnosis of ill patient    Order Specific Question:   Symptomatic for COVID-19 as defined by CDC    Answer:   Yes    Order Specific Question:   Date of Symptom Onset    Answer:   07/12/2019    Order Specific Question:   Hospitalized  for COVID-19    Answer:   No    Order Specific Question:   Admitted to ICU for COVID-19    Answer:   No    Order Specific Question:   Previously tested for COVID-19    Answer:   No    Order Specific Question:   Resident in a congregate (group) care setting    Answer:   No    Order Specific Question:   Employed in healthcare setting    Answer:   No    Order Specific Question:   Pregnant    Answer:   No  . Culture, group A strep    Standing Status:  Standing    Number of Occurrences:   1  . Novel Coronavirus, NAA (Hosp order, Send-out to Ref Lab; TAT 18-24 hrs    Standing Status:   Standing    Number of Occurrences:   1    Order Specific Question:   Is this test for diagnosis or screening    Answer:   Diagnosis of ill patient    Order Specific Question:   Symptomatic for COVID-19 as defined by CDC    Answer:   Yes    Order Specific Question:   Date of Symptom Onset    Answer:   07/04/2019    Order Specific Question:   Hospitalized for COVID-19    Answer:   No    Order Specific Question:   Admitted to ICU for COVID-19    Answer:   No    Order Specific Question:   Previously tested for COVID-19    Answer:   No    Order Specific Question:   Resident in a congregate (group) care setting    Answer:   No    Order Specific Question:   Employed in healthcare setting    Answer:   No    Order Specific Question:   Pregnant    Answer:   No    Results for orders placed or performed during the hospital encounter of 07/15/19 (from the past 24 hour(s))  Rapid Strep Screen ([facility] ONLY)     Status: None   Collection Time: 07/15/19  3:56 PM   Specimen: Throat; Other  Result Value Ref Range   Streptococcus, Group A Screen (Direct) NEGATIVE NEGATIVE  SARS Coronavirus 2 Ag (30 min TAT) - Nasal Swab (BD Veritor Kit)     Status: None   Collection Time: 07/15/19  3:56 PM   Specimen: Nasal Swab (BD Veritor Kit)  Result Value Ref Range   SARS Coronavirus 2 Ag NEGATIVE NEGATIVE  Culture,  group A strep     Status: None (Preliminary result)   Collection Time: 07/15/19  3:56 PM   Specimen: Throat  Result Value Ref Range   Specimen Description      THROAT Performed at [facility] [place], [address] [place], [place] [address]    Special Requests      NONE Reflexed from [id number] Performed at [facility], [address], [place], [place] [address]    Culture      TOO YOUNG TO READ Performed at [facility], [place] [address], [place], [place] [address]    Report Status PENDING    No results found.  ED Clinical Impression  1. Pharyngitis, unspecified etiology   2. Exposure to COVID-19 virus      ED Assessment/Plan  Rapid strep, Covid negative.  Sending off Covid PCR.  Home with supportive treatment including Flonase, saline nasal irrigation, Benadryl/Maalox mixture.  Follow-up with PMD as needed, to the ER if she gets worse.  Discussed labs, imaging, MDM, treatment plan, and plan for follow-up with patient. Discussed sn/sx that should prompt return to the ED. patient agrees with plan.   Meds ordered this encounter  Medications  . fluticasone (FLONASE) 50 MCG/ACT nasal spray    Sig: Place 2 sprays into both nostrils daily.    Dispense:  16 g    Refill:  0    *This clinic note was created using [employer]. Therefore, there may be occasional mistakes despite careful proofreading.   ?  Melynda Ripple, MD 07/16/19 [phone]

## 2019-07-15 NOTE — Discharge Instructions (Signed)
your rapid strep and rapid Covid were both negative today so we have sent off formal Covid testing and a throat culture.  We will contact you and call in the appropriate antibiotics if your culture comes back positive for an infection requiring antibiotic treatment.  Give [place] a working phone number.  If you were given a prescription for antibiotics, you may want to wait and fill it until you know the results of the culture.  1 gram of Tylenol 3 or 4 times a day as needed for pain.  You may take Anaprox twice a day as well.  This combined with the Tylenol is a very effective combination for pain.  Make sure you drink plenty of extra fluids.  Some people find salt water gargles and  Tr[employer] Medic[employer]'s "Throat Coat" tea helpful. Take 5 mL of liquid Benadryl and 5 mL of Maalox. Mix it together, and then hold it in your mouth for as long as you can and then swallow. You may do this 4 times a day.    Go to www.goodrx.com to look up your medications. This will give you a list of where you can find your prescriptions at the most affordable prices. Or ask the pharmacist what the cash price is, or if they have any other discount programs available to help make your medication more affordable. This can be less expensive than what you would pay with insurance.

## 2019-07-16 LAB — NOVEL CORONAVIRUS, NAA (HOSP ORDER, SEND-OUT TO REF LAB; TAT 18-24 HRS): SARS-CoV-2, NAA: NOT DETECTED

## 2019-07-18 LAB — CULTURE, GROUP A STREP (THRC)

## 2019-09-23 DIAGNOSIS — M9905 Segmental and somatic dysfunction of pelvic region: Secondary | ICD-10-CM | POA: Diagnosis not present

## 2019-09-30 DIAGNOSIS — M9905 Segmental and somatic dysfunction of pelvic region: Secondary | ICD-10-CM | POA: Diagnosis not present

## 2019-10-07 ENCOUNTER — Other Ambulatory Visit: Payer: Self-pay | Admitting: Adult Health

## 2019-10-20 DIAGNOSIS — M9905 Segmental and somatic dysfunction of pelvic region: Secondary | ICD-10-CM | POA: Diagnosis not present

## 2019-10-26 ENCOUNTER — Other Ambulatory Visit: Payer: Self-pay | Admitting: Adult Health

## 2019-10-26 DIAGNOSIS — F411 Generalized anxiety disorder: Secondary | ICD-10-CM

## 2019-10-26 NOTE — Telephone Encounter (Signed)
Pt need appt for refills can send back this message to me once appt done

## 2019-10-27 ENCOUNTER — Telehealth: Payer: Self-pay

## 2019-10-27 NOTE — Telephone Encounter (Signed)
Called lmom informing patient need to schedule appointment for med refill. klh

## 2019-10-28 ENCOUNTER — Telehealth: Payer: Self-pay

## 2019-10-28 NOTE — Telephone Encounter (Signed)
Left a message and asked pt to call back and schedule appointment for follow up on medication refills. beth

## 2019-10-28 NOTE — Telephone Encounter (Signed)
Left messages 10/27/19 and 10/28/19

## 2019-10-31 ENCOUNTER — Telehealth: Payer: Self-pay

## 2019-10-31 NOTE — Telephone Encounter (Signed)
Confirmed appointment on 11/01/2019 and screened for covid. klh 

## 2019-11-01 ENCOUNTER — Encounter: Payer: Self-pay | Admitting: Adult Health

## 2019-11-01 ENCOUNTER — Other Ambulatory Visit: Payer: Self-pay | Admitting: Adult Health

## 2019-11-01 ENCOUNTER — Other Ambulatory Visit: Payer: Self-pay

## 2019-11-01 ENCOUNTER — Ambulatory Visit: Payer: BC Managed Care – PPO | Admitting: Adult Health

## 2019-11-01 VITALS — BP 128/72 | HR 75 | Temp 97.5°F | Resp 16 | Ht 70.0 in | Wt 192.0 lb

## 2019-11-01 DIAGNOSIS — F411 Generalized anxiety disorder: Secondary | ICD-10-CM | POA: Diagnosis not present

## 2019-11-01 DIAGNOSIS — G43809 Other migraine, not intractable, without status migrainosus: Secondary | ICD-10-CM | POA: Diagnosis not present

## 2019-11-01 DIAGNOSIS — B009 Herpesviral infection, unspecified: Secondary | ICD-10-CM

## 2019-11-01 DIAGNOSIS — I10 Essential (primary) hypertension: Secondary | ICD-10-CM | POA: Diagnosis not present

## 2019-11-01 DIAGNOSIS — E785 Hyperlipidemia, unspecified: Secondary | ICD-10-CM | POA: Diagnosis not present

## 2019-11-01 DIAGNOSIS — Z6827 Body mass index (BMI) 27.0-27.9, adult: Secondary | ICD-10-CM

## 2019-11-01 MED ORDER — ATORVASTATIN CALCIUM 10 MG PO TABS: ORAL_TABLET | ORAL | 5 refills | Status: DC

## 2019-11-01 MED ORDER — ALPRAZOLAM 0.5 MG PO TABS: 0.5000 mg | ORAL_TABLET | Freq: Every evening | ORAL | 1 refills | Status: DC | PRN

## 2019-11-01 MED ORDER — VALACYCLOVIR HCL 1 G PO TABS: 1000.0000 mg | ORAL_TABLET | ORAL | 2 refills | Status: DC | PRN

## 2019-11-01 MED ORDER — VALSARTAN-HYDROCHLOROTHIAZIDE 80-12.5 MG PO TABS: ORAL_TABLET | ORAL | 5 refills | Status: DC

## 2019-11-01 MED ORDER — VENLAFAXINE HCL ER 37.5 MG PO CP24: ORAL_CAPSULE | ORAL | 4 refills | Status: DC

## 2019-11-01 MED ORDER — RIZATRIPTAN BENZOATE 5 MG PO TABS: 5.0000 mg | ORAL_TABLET | ORAL | 2 refills | Status: DC | PRN

## 2019-11-01 MED ORDER — NAPROXEN SODIUM 550 MG PO TABS: 550.0000 mg | ORAL_TABLET | Freq: Two times a day (BID) | ORAL | 2 refills | Status: DC

## 2019-11-01 NOTE — Progress Notes (Signed)
[facility] [place], [place] [address]  Internal MEDICINE  Office Visit Note  Patient Name: Laura Barber  [id number]  NR:[id number]  Date of Service: 11/01/2019  Chief Complaint  Patient presents with  . Gastroesophageal Reflux  . Hyperlipidemia  . Hypertension  . Medication Refill    HPI  Pt is here for follow up on HTN, HLD, herpes, anxiety and migraines.  Overall she is doing very well. Her blood pressure is well controlled.  She Denies Chest pain, Shortness of breath, palpitations, headache, or blurred vision.  She continues to take all of her medications.  She denies any recent herpes outbreaks.  Her anxiety is a little elevated at this time due to some family stress with her brother.  She feels like she is coping, but recognizes it is elevated. She is requesting refills at this time.     Current Medication: Outpatient Encounter Medications as of 11/01/2019  Medication Sig  . desonide (DESOWEN) 0.05 % cream APPLY A SMALL AMOUNT TO SKIN TWICE DAILY AS NEEDED.  Marland Kitchen estradiol (VIVELLE-DOT) 0.0375 MG/24HR Place 1 patch onto the skin 2 (two) times a week.  . fluticasone (FLONASE) 50 MCG/ACT nasal spray Place 2 sprays into both nostrils daily.  . metoprolol tartrate (LOPRESSOR) 25 MG tablet TAKE 1/2 TABLET BY MOUTH TWICE DAILY  . Multiple Vitamin (MULTIVITAMIN) tablet Take 1 tablet by mouth daily.  Marland Kitchen omeprazole (PRILOSEC) 20 MG capsule Take 1 capsule (20 mg total) by mouth daily.  . [DISCONTINUED] ALPRAZolam (XANAX) 0.5 MG tablet Take 1 tablet (0.5 mg total) by mouth at bedtime as needed for anxiety.  . [DISCONTINUED] atorvastatin (LIPITOR) 10 MG tablet TAKE ONE (1) TABLET BY MOUTH ONCE DAILY AT 6 IN THE EVENING  . [DISCONTINUED] naproxen sodium (ANAPROX) 550 MG tablet Take 1 tablet (550 mg total) by mouth 2 (two) times daily with a meal.  . [DISCONTINUED] rizatriptan (MAXALT) 5 MG tablet Take 1 tablet (5 mg total) by mouth as needed for migraine.  .  [DISCONTINUED] valACYclovir (VALTREX) 1000 MG tablet Take 1 tablet (1,000 mg total) by mouth as needed.  . [DISCONTINUED] valsartan-hydrochlorothiazide (DIOVAN-HCT) 80-12.5 MG tablet TAKE ONE (1) TABLET BY MOUTH ONCE DAILY  . [DISCONTINUED] venlafaxine XR (EFFEXOR-XR) 37.5 MG 24 hr capsule TAKE (1) CAPSULE BY MOUTH EVERY DAY WITHBREAKFAST  . ALPRAZolam (XANAX) 0.5 MG tablet Take 1 tablet (0.5 mg total) by mouth at bedtime as needed for anxiety.  Marland Kitchen atorvastatin (LIPITOR) 10 MG tablet TAKE ONE (1) TABLET BY MOUTH ONCE DAILY AT 6 IN THE EVENING  . naproxen sodium (ANAPROX) 550 MG tablet Take 1 tablet (550 mg total) by mouth 2 (two) times daily with a meal.  . rizatriptan (MAXALT) 5 MG tablet Take 1 tablet (5 mg total) by mouth as needed for migraine.  . valACYclovir (VALTREX) 1000 MG tablet Take 1 tablet (1,000 mg total) by mouth as needed.  . valsartan-hydrochlorothiazide (DIOVAN-HCT) 80-12.5 MG tablet TAKE ONE (1) TABLET BY MOUTH ONCE DAILY  . venlafaxine XR (EFFEXOR-XR) 37.5 MG 24 hr capsule TAKE (1) CAPSULE BY MOUTH EVERY DAY WITHBREAKFAST  . [DISCONTINUED] cetirizine (ZYRTEC) 10 MG tablet Take 10 mg by mouth daily.   No facility-administered encounter medications on file as of 11/01/2019.    Surgical History: Past Surgical History:  Procedure Laterality Date  . ABDOMINAL HYSTERECTOMY  2005   Radical hysterectomy by Dr Clarene Essex at [facility] cervical cancer 1B1  . BREAST CYST ASPIRATION Left 05/30/2013  . CERVICAL DISC ARTHROPLASTY Right 06/11/2016  Procedure: CERVICAL ANTERIOR [place] ARTHROPLASTY CERVICAL 5-7;  Surgeon: Blanche East, MD;  Location: [facility] ORS;  Service: Neurosurgery;  Laterality: Right;  . DIAGNOSTIC LAPAROSCOPY  2005  . DILATION AND CURETTAGE OF UTERUS     SAB  . 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   Marland Kitchen NASAL SINUS SURGERY      Medical History: Past Medical History:  Diagnosis Date  . Anxiety   . Cancer (HCC)    Hx cervical cancer   . Cervical cancer, FIGO stage IB1 ([place]) 2005   Cervical  . GERD (gastroesophageal reflux disease)   . Headache   . Hyperlipidemia   . Hypertension   . Migraine   . Shingles 2019    Family History: Family History  Problem Relation Age of Onset  . Lung cancer Mother   . Hyperlipidemia Mother   . Diabetes Father   . Hypertension Father   . Heart failure Father   . Heart attack Father   . Heart disease Father   . Hyperlipidemia Brother   . Breast cancer Neg Hx     Social History   Socioeconomic History  . Marital status: Divorced    Spouse name: Not on file  . Number of children: 0  . Years of education: Not on file  . Highest education level: Not on file  Occupational History  . Not on file  Tobacco Use  . Smoking status: Never Smoker  . Smokeless tobacco: Never Used  Substance and Sexual Activity  . Alcohol use: Yes    Comment: occassional  . Drug use: No  . Sexual activity: Yes    Partners: Male    Birth control/protection: Surgical  Other Topics Concern  . Not on file  Social History Narrative  . Not on file   Social Determinants of Health   Financial Resource Strain:   . Difficulty of Paying Living Expenses: Not on file  Food Insecurity:   . Worried About Charity fundraiser in the Last Year: Not on file  . Ran Out of Food in the Last Year: Not on file  Transportation Needs:   . Lack of Transportation (Medical): Not on file  . Lack of Transportation (Non-Medical): Not on file  Physical Activity:   . Days of Exercise per Week: Not on file  . Minutes of Exercise per Session: Not on file  Stress:   . Feeling of Stress : Not on file  Social Connections:   . Frequency of Communication with Friends and Family: Not on file  . Frequency of Social Gatherings with Friends and Family: Not on file  . Attends Religious Services: Not on file  . Active Member of Clubs or Organizations: Not on file  . Attends Archivist Meetings: Not on file  .  Marital Status: Not on file  Intimate Partner Violence:   . Fear of Current or Ex-Partner: Not on file  . Emotionally Abused: Not on file  . Physically Abused: Not on file  . Sexually Abused: Not on file      Review of Systems  Constitutional: Negative for chills, fatigue and unexpected weight change.  HENT: Negative for congestion, rhinorrhea, sneezing and sore throat.   Eyes: Negative for photophobia, pain and redness.  Respiratory: Negative for cough, chest tightness and shortness of breath.   Cardiovascular: Negative for chest pain and palpitations.  Gastrointestinal: Negative for abdominal pain, constipation, diarrhea, nausea and vomiting.  Endocrine: Negative.   Genitourinary: Negative  for dysuria and frequency.  Musculoskeletal: Negative for arthralgias, back pain, joint swelling and neck pain.  Skin: Negative for rash.  Allergic/Immunologic: Negative.   Neurological: Negative for tremors and numbness.  Hematological: Negative for adenopathy. Does not bruise/bleed easily.  Psychiatric/Behavioral: Negative for behavioral problems and sleep disturbance. The patient is not nervous/anxious.     Vital Signs: BP 128/72   Pulse 75   Temp (!) 97.5 F (36.4 C)   Resp 16   Ht 5\' 10"  (1.778 m)   Wt 192 lb (87.1 kg)   SpO2 96%   BMI 27.55 kg/m    Physical Exam Vitals and nursing note reviewed.  Constitutional:      General: She is not in acute distress.    Appearance: She is well-developed. She is not diaphoretic.  HENT:     Head: Normocephalic and atraumatic.     Mouth/Throat:     Pharynx: No oropharyngeal exudate.  Eyes:     Pupils: Pupils are equal, round, and reactive to light.  Neck:     Thyroid: No thyromegaly.     Vascular: No JVD.     Trachea: No tracheal deviation.  Cardiovascular:     Rate and Rhythm: Normal rate and regular rhythm.     Heart sounds: Normal heart sounds. No murmur. No friction rub. No gallop.   Pulmonary:     Effort: Pulmonary effort is  normal. No respiratory distress.     Breath sounds: Normal breath sounds. No wheezing or rales.  Chest:     Chest wall: No tenderness.  Abdominal:     Palpations: Abdomen is soft.     Tenderness: There is no abdominal tenderness. There is no guarding.  Musculoskeletal:        General: Normal range of motion.     Cervical back: Normal range of motion and neck supple.  Lymphadenopathy:     Cervical: No cervical adenopathy.  Skin:    General: Skin is warm and dry.  Neurological:     Mental Status: She is alert and oriented to person, place, and time.     Cranial Nerves: No cranial nerve deficit.  Psychiatric:        Behavior: Behavior normal.        Thought Content: Thought content normal.        Judgment: Judgment normal.    Assessment/Plan: 1. Hypertension, unspecified type Stable continue with Diovan as prescribed.  - valsartan-hydrochlorothiazide (DIOVAN-HCT) 80-12.5 MG tablet; TAKE ONE (1) TABLET BY MOUTH ONCE DAILY  Dispense: 30 tablet; Refill: 5  2. Generalized anxiety disorder Stable, continue Effexor and xanax as prescribed.  - venlafaxine XR (EFFEXOR-XR) 37.5 MG 24 hr capsule; TAKE (1) CAPSULE BY MOUTH EVERY DAY WITHBREAKFAST  Dispense: 30 capsule; Refill: 4 - ALPRAZolam (XANAX) 0.5 MG tablet; Take 1 tablet (0.5 mg total) by mouth at bedtime as needed for anxiety.  Dispense: 30 tablet; Refill: 1  3. Other migraine without status migrainosus, not intractable Migraines are well controlled currently, refilled medications.  - rizatriptan (MAXALT) 5 MG tablet; Take 1 tablet (5 mg total) by mouth as needed for migraine.  Dispense: 10 tablet; Refill: 2 - naproxen sodium (ANAPROX) 550 MG tablet; Take 1 tablet (550 mg total) by mouth 2 (two) times daily with a meal.  Dispense: 60 tablet; Refill: 2  4. Hyperlipidemia, unspecified hyperlipidemia type Refilled Lipiotr - atorvastatin (LIPITOR) 10 MG tablet; TAKE ONE (1) TABLET BY MOUTH ONCE DAILY AT 6 IN THE EVENING  Dispense: 30  tablet;  Refill: 5  5. Herpes Refilled valtrex - valACYclovir (VALTREX) 1000 MG tablet; Take 1 tablet (1,000 mg total) by mouth as needed.  Dispense: 30 tablet; Refill: 2  6. BMI 27.0-27.9,adult Pt has gained around 15 pounds since covid started one year ago.  She is working on weight loss at this time.   General Counseling: jorita staton understanding of the findings of todays visit and agrees with plan of treatment. I have discussed any further diagnostic evaluation that may be needed or ordered today. We also reviewed her medications today. she has been encouraged to call the office with any questions or concerns that should arise related to todays visit.    No orders of the defined types were placed in this encounter.   Meds ordered this encounter  Medications  . venlafaxine XR (EFFEXOR-XR) 37.5 MG 24 hr capsule    Sig: TAKE (1) CAPSULE BY MOUTH EVERY DAY WITHBREAKFAST    Dispense:  30 capsule    Refill:  4  . valsartan-hydrochlorothiazide (DIOVAN-HCT) 80-12.5 MG tablet    Sig: TAKE ONE (1) TABLET BY MOUTH ONCE DAILY    Dispense:  30 tablet    Refill:  5  . valACYclovir (VALTREX) 1000 MG tablet    Sig: Take 1 tablet (1,000 mg total) by mouth as needed.    Dispense:  30 tablet    Refill:  2  . ALPRAZolam (XANAX) 0.5 MG tablet    Sig: Take 1 tablet (0.5 mg total) by mouth at bedtime as needed for anxiety.    Dispense:  30 tablet    Refill:  1  . atorvastatin (LIPITOR) 10 MG tablet    Sig: TAKE ONE (1) TABLET BY MOUTH ONCE DAILY AT 6 IN THE EVENING    Dispense:  30 tablet    Refill:  5  . rizatriptan (MAXALT) 5 MG tablet    Sig: Take 1 tablet (5 mg total) by mouth as needed for migraine.    Dispense:  10 tablet    Refill:  2  . naproxen sodium (ANAPROX) 550 MG tablet    Sig: Take 1 tablet (550 mg total) by mouth 2 (two) times daily with a meal.    Dispense:  60 tablet    Refill:  2    Time spent: 25 Minutes   This patient was seen by Orson Gear AGNP-C in  Collaboration with Dr Lavera Guise as a part of collaborative care agreement     Kendell Bane AGNP-C Internal medicine

## 2019-11-23 DIAGNOSIS — M9905 Segmental and somatic dysfunction of pelvic region: Secondary | ICD-10-CM | POA: Diagnosis not present

## 2019-12-23 DIAGNOSIS — M9905 Segmental and somatic dysfunction of pelvic region: Secondary | ICD-10-CM | POA: Diagnosis not present

## 2020-01-08 ENCOUNTER — Other Ambulatory Visit: Payer: Self-pay | Admitting: Certified Nurse Midwife

## 2020-01-19 DIAGNOSIS — M9905 Segmental and somatic dysfunction of pelvic region: Secondary | ICD-10-CM | POA: Diagnosis not present

## 2020-01-31 ENCOUNTER — Other Ambulatory Visit: Payer: Self-pay

## 2020-01-31 ENCOUNTER — Ambulatory Visit: Payer: BC Managed Care – PPO | Admitting: Adult Health

## 2020-01-31 ENCOUNTER — Encounter: Payer: Self-pay | Admitting: Adult Health

## 2020-01-31 VITALS — BP 130/47 | HR 67 | Temp 97.4°F | Resp 16 | Ht 70.0 in | Wt 186.0 lb

## 2020-01-31 DIAGNOSIS — I10 Essential (primary) hypertension: Secondary | ICD-10-CM | POA: Diagnosis not present

## 2020-01-31 DIAGNOSIS — G43809 Other migraine, not intractable, without status migrainosus: Secondary | ICD-10-CM | POA: Diagnosis not present

## 2020-01-31 DIAGNOSIS — F411 Generalized anxiety disorder: Secondary | ICD-10-CM

## 2020-01-31 NOTE — Progress Notes (Signed)
[facility] [place],  [id number]  Internal MEDICINE  Office Visit Note  Patient Name: Laura Barber  [id number]  [id number]  Date of Service: 01/31/2020  Chief Complaint  Patient presents with  . Follow-up  . Gastroesophageal Reflux  . Hyperlipidemia  . Hypertension    HPI  Pt is here for follow up on GERD, HLD, HTN and migraines.  Her GERD and HLD are controlled. She reports  Overall she is doing well.  Her bp is 130/47 today.  Denies Chest pain, Shortness of breath, palpitations, headache, or blurred vision.    Current Medication: Outpatient Encounter Medications as of 01/31/2020  Medication Sig  . ALPRAZolam (XANAX) 0.5 MG tablet Take 1 tablet (0.5 mg total) by mouth at bedtime as needed for anxiety.  Marland Kitchen atorvastatin (LIPITOR) 10 MG tablet TAKE ONE (1) TABLET BY MOUTH ONCE DAILY AT 6 IN THE EVENING  . desonide (DESOWEN) 0.05 % cream APPLY A SMALL AMOUNT TO SKIN TWICE DAILYAS NEEDED  . estradiol (VIVELLE-DOT) 0.0375 MG/24HR APPLY 1 PATCH EXTERNALLY TO THE SKIN 2 TIMES A WEEK  . fluticasone (FLONASE) 50 MCG/ACT nasal spray Place 2 sprays into both nostrils daily.  . metoprolol tartrate (LOPRESSOR) 25 MG tablet TAKE 1/2 TABLET BY MOUTH TWICE DAILY  . Multiple Vitamin (MULTIVITAMIN) tablet Take 1 tablet by mouth daily.  . naproxen sodium (ANAPROX) 550 MG tablet Take 1 tablet (550 mg total) by mouth 2 (two) times daily with a meal.  . omeprazole (PRILOSEC) 20 MG capsule Take 1 capsule (20 mg total) by mouth daily.  . rizatriptan (MAXALT) 5 MG tablet Take 1 tablet (5 mg total) by mouth as needed for migraine.  . valACYclovir (VALTREX) 1000 MG tablet Take 1 tablet (1,000 mg total) by mouth as needed.  . valsartan-hydrochlorothiazide (DIOVAN-HCT) 80-12.5 MG tablet TAKE ONE (1) TABLET BY MOUTH ONCE DAILY  . venlafaxine XR (EFFEXOR-XR) 37.5 MG 24 hr capsule TAKE (1) CAPSULE BY MOUTH EVERY DAY WITHBREAKFAST  . [DISCONTINUED] cetirizine (ZYRTEC) 10 MG  tablet Take 10 mg by mouth daily.   No facility-administered encounter medications on file as of 01/31/2020.    Surgical History: Past Surgical History:  Procedure Laterality Date  . ABDOMINAL HYSTERECTOMY  2005   Radical hysterectomy by Dr Clarene Essex at [facility] cervical cancer 1B1  . BREAST CYST ASPIRATION Left 05/30/2013  . CERVICAL DISC ARTHROPLASTY Right 06/11/2016   Procedure: CERVICAL ANTERIOR [place] ARTHROPLASTY CERVICAL 5-7;  Surgeon: Blanche East, MD;  Location: [facility] ORS;  Service: Neurosurgery;  Laterality: Right;  . DIAGNOSTIC LAPAROSCOPY  2005  . DILATION AND CURETTAGE OF UTERUS     SAB  . 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   Marland Kitchen NASAL SINUS SURGERY      Medical History: Past Medical History:  Diagnosis Date  . Anxiety   . Cancer (HCC)    Hx cervical cancer  . Cervical cancer, FIGO stage IB1 ([place]) 2005   Cervical  . GERD (gastroesophageal reflux disease)   . Headache   . Hyperlipidemia   . Hypertension   . Migraine   . Shingles 2019    Family History: Family History  Problem Relation Age of Onset  . Lung cancer Mother   . Hyperlipidemia Mother   . Diabetes Father   . Hypertension Father   . Heart failure Father   . Heart attack Father   . Heart disease Father   . Hyperlipidemia Brother   . Breast cancer Neg Hx  Social History   Socioeconomic History  . Marital status: Divorced    Spouse name: Not on file  . Number of children: 0  . Years of education: Not on file  . Highest education level: Not on file  Occupational History  . Not on file  Tobacco Use  . Smoking status: Never Smoker  . Smokeless tobacco: Never Used  Substance and Sexual Activity  . Alcohol use: Yes    Comment: occassional  . Drug use: No  . Sexual activity: Yes    Partners: Male    Birth control/protection: Surgical  Other Topics Concern  . Not on file  Social History Narrative  . Not on file   Social Determinants of Health    Financial Resource Strain:   . Difficulty of Paying Living Expenses:   Food Insecurity:   . Worried About Charity fundraiser in the Last Year:   . Arboriculturist in the Last Year:   Transportation Needs:   . Film/video editor (Medical):   Marland Kitchen Lack of Transportation (Non-Medical):   Physical Activity:   . Days of Exercise per Week:   . Minutes of Exercise per Session:   Stress:   . Feeling of Stress :   Social Connections:   . Frequency of Communication with Friends and Family:   . Frequency of Social Gatherings with Friends and Family:   . Attends Religious Services:   . Active Member of Clubs or Organizations:   . Attends Archivist Meetings:   Marland Kitchen Marital Status:   Intimate Partner Violence:   . Fear of Current or Ex-Partner:   . Emotionally Abused:   Marland Kitchen Physically Abused:   . Sexually Abused:       Review of Systems  Constitutional: Negative for chills, fatigue and unexpected weight change.  HENT: Negative for congestion, rhinorrhea, sneezing and sore throat.   Eyes: Negative for photophobia, pain and redness.  Respiratory: Negative for cough, chest tightness and shortness of breath.   Cardiovascular: Negative for chest pain and palpitations.  Gastrointestinal: Negative for abdominal pain, constipation, diarrhea, nausea and vomiting.  Endocrine: Negative.   Genitourinary: Negative for dysuria and frequency.  Musculoskeletal: Negative for arthralgias, back pain, joint swelling and neck pain.  Skin: Negative for rash.  Allergic/Immunologic: Negative.   Neurological: Negative for tremors and numbness.  Hematological: Negative for adenopathy. Does not bruise/bleed easily.  Psychiatric/Behavioral: Negative for behavioral problems and sleep disturbance. The patient is not nervous/anxious.     Vital Signs: BP (!) 130/47   Pulse 67   Temp (!) 97.4 F (36.3 C)   Resp 16   Ht 5\' 10"  (1.778 m)   Wt 186 lb (84.4 kg)   SpO2 96%   BMI 26.69 kg/m     Physical Exam Vitals and nursing note reviewed.  Constitutional:      General: She is not in acute distress.    Appearance: She is well-developed. She is not diaphoretic.  HENT:     Head: Normocephalic and atraumatic.     Mouth/Throat:     Pharynx: No oropharyngeal exudate.  Eyes:     Pupils: Pupils are equal, round, and reactive to light.  Neck:     Thyroid: No thyromegaly.     Vascular: No JVD.     Trachea: No tracheal deviation.  Cardiovascular:     Rate and Rhythm: Normal rate and regular rhythm.     Heart sounds: Normal heart sounds. No murmur. No friction rub.  No gallop.   Pulmonary:     Effort: Pulmonary effort is normal. No respiratory distress.     Breath sounds: Normal breath sounds. No wheezing or rales.  Chest:     Chest wall: No tenderness.  Abdominal:     Palpations: Abdomen is soft.     Tenderness: There is no abdominal tenderness. There is no guarding.  Musculoskeletal:        General: Normal range of motion.     Cervical back: Normal range of motion and neck supple.  Lymphadenopathy:     Cervical: No cervical adenopathy.  Skin:    General: Skin is warm and dry.  Neurological:     Mental Status: She is alert and oriented to person, place, and time.     Cranial Nerves: No cranial nerve deficit.  Psychiatric:        Behavior: Behavior normal.        Thought Content: Thought content normal.        Judgment: Judgment normal.     Assessment/Plan: 1. Hypertension, unspecified type Stable, continue to monitor closely at home.  2. Generalized anxiety disorder Controlled, no recent issues. Continue to follow.  3. Other migraine without status migrainosus, not intractable Good relief with medications, continue as directed.   General Counseling: avalynne diver understanding of the findings of todays visit and agrees with plan of treatment. I have discussed any further diagnostic evaluation that may be needed or ordered today. We also reviewed her  medications today. she has been encouraged to call the office with any questions or concerns that should arise related to todays visit.    No orders of the defined types were placed in this encounter.   No orders of the defined types were placed in this encounter.   Time spent: 30 Minutes   This patient was seen by Orson Gear AGNP-C in Collaboration with Dr Lavera Guise as a part of collaborative care agreement     Kendell Bane AGNP-C Internal medicine

## 2020-02-07 ENCOUNTER — Ambulatory Visit (INDEPENDENT_AMBULATORY_CARE_PROVIDER_SITE_OTHER): Payer: BC Managed Care – PPO | Admitting: Certified Nurse Midwife

## 2020-02-07 ENCOUNTER — Encounter: Payer: Self-pay | Admitting: Certified Nurse Midwife

## 2020-02-07 ENCOUNTER — Other Ambulatory Visit: Payer: Self-pay

## 2020-02-07 ENCOUNTER — Other Ambulatory Visit (HOSPITAL_COMMUNITY)
Admission: RE | Admit: 2020-02-07 | Discharge: 2020-02-07 | Disposition: A | Discharge status: Home / Self Care | Payer: BC Managed Care – PPO | Source: Ambulatory Visit | Attending: Certified Nurse Midwife | Admitting: Certified Nurse Midwife

## 2020-02-07 ENCOUNTER — Ambulatory Visit: Payer: Self-pay | Admitting: Certified Nurse Midwife

## 2020-02-07 VITALS — BP 106/72 | HR 60 | Ht 71.0 in | Wt 184.0 lb

## 2020-02-07 DIAGNOSIS — Z01419 Encounter for gynecological examination (general) (routine) without abnormal findings: Secondary | ICD-10-CM | POA: Diagnosis not present

## 2020-02-07 DIAGNOSIS — Z5181 Encounter for therapeutic drug level monitoring: Secondary | ICD-10-CM | POA: Diagnosis not present

## 2020-02-07 DIAGNOSIS — Z79890 Hormone replacement therapy (postmenopausal): Secondary | ICD-10-CM

## 2020-02-07 DIAGNOSIS — C539 Malignant neoplasm of cervix uteri, unspecified: Secondary | ICD-10-CM | POA: Insufficient documentation

## 2020-02-07 DIAGNOSIS — Z1231 Encounter for screening mammogram for malignant neoplasm of breast: Secondary | ICD-10-CM | POA: Diagnosis not present

## 2020-02-07 MED ORDER — ESTRADIOL 0.0375 MG/24HR TD PTTW: MEDICATED_PATCH | TRANSDERMAL | 4 refills | Status: DC

## 2020-02-07 NOTE — Progress Notes (Signed)
Gynecology Annual Exam  PCP: Lavera Guise, MD  Chief Complaint:  Chief Complaint  Patient presents with  . Gynecologic Exam    History of Present Illness:Laura Barber is a 54 year old Caucasian/White female, G1 P0010, who presents for her annual exam. She is not having any significant gyn problems. Her Vivelle Dot 0.0375 patch has greatly reduced her hot flashes and night sweats. Her menses are absent due to a radical hysterectomy for invasive cervical cancer in 2005  She has had no spotting.   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  Since her last annual GYN exam dated 01/25/2019, she has had no significant changes to her health. She is sexually active. She denies dyspareunia Her most recent pap smear was obtained 01/25/2019 and was NIL with negative HRHPV. Had abnormal Pap smears in 2017, NIL/positive HRHPV, and in  05/2014, LGSIL and positive HRHPV. Dr Laurey Morale did a colpo in 2015 which was negative and she was also seen by Dr Clarene Essex who also did a colposcopy in 2015 which was also negative.   Her most recent mammogram obtained 01/25/2019 was [facility] 1 She had a colonoscopy 2019 which was normal. Next colonoscopy due in 2029. There is no family history of breast cancer.  There is no family history of ovarian cancer.  The patient does do occasional self breast exams.  The patient does not smoke.  The patient does drink alcohol occasionally.  The patient does not use illegal drugs.  The patient exercises regularly attends a [employer] 3 days/ week. The patient does not get adequate calcium in her diet.  She had a recent cholesterol screen by PCP at [facility] in 2020 that was borderline.   The patient denies current symptoms of depression.    Review of Systems:  Review of Systems  Constitutional: Negative for chills, fever and weight loss.  HENT: Negative for congestion, sinus pain and sore throat.   Eyes: Negative for blurred vision and pain.  Respiratory: Negative for hemoptysis, shortness of breath and wheezing.   Cardiovascular: Negative for chest pain, palpitations and leg swelling.  Gastrointestinal: Negative for abdominal pain, blood in stool, diarrhea, heartburn, nausea and vomiting.  Genitourinary: Negative for dysuria, frequency, hematuria and urgency.  Musculoskeletal: Negative for back pain, joint pain and myalgias.  Skin: Negative for itching and rash.  Neurological: Negative for dizziness, tingling and headaches.  Endo/Heme/Allergies: Positive for environmental allergies (sneezing and coughing). Negative for polydipsia. Does not bruise/bleed easily.       Negative for hirsutism   Psychiatric/Behavioral: Negative for depression. The patient is not nervous/anxious and does not have insomnia.   All other systems reviewed and are negative.   Past Medical History:  Past Medical History:  Diagnosis Date  . Anxiety   . Cervical cancer, FIGO stage IB1 ([place]) 2005   Cervical  . GERD (gastroesophageal reflux disease)   . Headache   . Hyperlipidemia   . Hypertension   . Migraine   . Shingles 2019    Past Surgical History:  Past Surgical History:  Procedure Laterality Date  . ABDOMINAL HYSTERECTOMY  2005   Radical hysterectomy by Dr Clarene Essex at [facility] cervical cancer 1B1  . BREAST CYST ASPIRATION Left 05/30/2013  . CERVICAL DISC ARTHROPLASTY Right 06/11/2016   Procedure: CERVICAL ANTERIOR  [place] ARTHROPLASTY CERVICAL 5-7;  Surgeon: Blanche East, MD;  Location: [facility] ORS;  Service: Neurosurgery;  Laterality: Right;  . DIAGNOSTIC LAPAROSCOPY  2005  . DILATION AND CURETTAGE OF UTERUS     SAB  . 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   Marland Kitchen NASAL SINUS SURGERY      Family History:  Family History    Problem Relation Age of Onset  . Lung cancer Mother   . Hyperlipidemia Mother   . Diabetes Father   . Hypertension Father   . Heart failure Father   . Heart attack Father   . Heart disease Father   . Hyperlipidemia Brother   . Breast cancer Neg Hx     Social History:  Social History   Socioeconomic History  . Marital status: Divorced    Spouse name: Not on file  . Number of children: 0  . Years of education: Not on file  . Highest education level: Not on file  Occupational History  . Not on file  Tobacco Use  . Smoking status: Never Smoker  . Smokeless tobacco: Never Used  Vaping Use  . Vaping Use: Never used  Substance and Sexual Activity  . Alcohol use: Yes    Comment: occassional  . Drug use: No  . Sexual activity: Yes    Partners: Male    Birth control/protection: Surgical  Other Topics Concern  . Not on file  Social History Narrative  . Not on file   Social Determinants of Health   Financial Resource Strain:   . Difficulty of Paying Living Expenses:   Food Insecurity:   . Worried About Charity fundraiser in the Last Year:   . Arboriculturist in the Last Year:   Transportation Needs:   . Film/video editor (Medical):   Marland Kitchen Lack of Transportation (Non-Medical):   Physical Activity:   . Days of Exercise per Week:   . Minutes of Exercise per Session:   Stress:   . Feeling of Stress :   Social Connections:   . Frequency of Communication with Friends and Family:   . Frequency of Social Gatherings with Friends and Family:   . Attends Religious Services:   . Active Member of Clubs or Organizations:   . Attends Archivist Meetings:   Marland Kitchen Marital Status:   Intimate Partner Violence:   . Fear of Current or Ex-Partner:   . Emotionally Abused:   Marland Kitchen Physically Abused:   . Sexually Abused:     Allergies:  Allergies  Allergen Reactions  . Claritin-D 12 Hour [Loratadine-Pseudoephedrine Er] Itching and Swelling  . Sulfa Antibiotics Itching and  Swelling    Medications:  Current Outpatient Medications on File Prior to Visit  Medication Sig Dispense Refill  . ALPRAZolam (XANAX) 0.5 MG tablet Take 1 tablet (0.5 mg total) by mouth at bedtime as needed for anxiety. 30 tablet 1  . atorvastatin (LIPITOR) 10 MG tablet TAKE ONE (1) TABLET BY MOUTH ONCE DAILY AT 6 IN THE EVENING 30 tablet 5  . desonide (DESOWEN) 0.05 % cream APPLY A SMALL AMOUNT TO SKIN TWICE DAILYAS NEEDED 30 g 3  . fluticasone (FLONASE) 50 MCG/ACT nasal spray Place 2 sprays into both nostrils daily. 16 g 0  . metoprolol tartrate (LOPRESSOR) 25 MG tablet TAKE 1/2 TABLET BY MOUTH TWICE DAILY 90 tablet 3  . Multiple Vitamin (MULTIVITAMIN) tablet Take 1 tablet by mouth daily.    . naproxen sodium (  ANAPROX) 550 MG tablet Take 1 tablet (550 mg total) by mouth 2 (two) times daily with a meal. 60 tablet 2  . omeprazole (PRILOSEC) 20 MG capsule Take 1 capsule (20 mg total) by mouth daily. 30 capsule 3  . rizatriptan (MAXALT) 5 MG tablet Take 1 tablet (5 mg total) by mouth as needed for migraine. 10 tablet 2  . valACYclovir (VALTREX) 1000 MG tablet Take 1 tablet (1,000 mg total) by mouth as needed. 30 tablet 2  . valsartan-hydrochlorothiazide (DIOVAN-HCT) 80-12.5 MG tablet TAKE ONE (1) TABLET BY MOUTH ONCE DAILY 30 tablet 5  . venlafaxine XR (EFFEXOR-XR) 37.5 MG 24 hr capsule TAKE (1) CAPSULE BY MOUTH EVERY DAY WITHBREAKFAST 30 capsule 4  . [DISCONTINUED] cetirizine (ZYRTEC) 10 MG tablet Take 10 mg by mouth daily.     No current facility-administered medications on file prior to visit.   Physical Exam Vitals: BP 106/72   Pulse 60   Ht 5\' 11"  (1.803 m)   Wt 184 lb (83.5 kg)   BMI 25.66 kg/m   General:WF in  NAD HEENT: normocephalic, anicteric Neck: no thyroid enlargement, no palpable nodules, no cervical lymphadenopathy  Pulmonary: No increased work of breathing, CTAB Cardiovascular: RRR, without murmur  Breast: Breast symmetrical, no tenderness, no palpable nodules or  masses, no skin or nipple retraction present, no nipple discharge.  No axillary, infraclavicular or supraclavicular lymphadenopathy. Abdomen: Soft, non-tender, non-distended.  Umbilicus without lesions.  No hepatomegaly or masses palpable. No evidence of hernia. Genitourinary:  External: Normal external female genitalia.  Normal urethral meatus, normal Bartholin's and Skene's glands.    Vagina: Normal vaginal mucosa, no evidence of prolapse, white mucoepithelial discharge  Cervix: surgically absent  Uterus: surgically absent  Adnexa: No adnexal masses, non-tender  Rectal: deferred  Lymphatic: no evidence of inguinal lymphadenopathy Extremities: no edema, erythema, or tenderness Neurologic: Grossly intact Psychiatric: mood appropriate, affect full     Assessment: 54 y.o. G1P0010 annual gyn exam  H/O TAH for cervical cancer Menopausal symptoms relieved with ET.  Plan:   1) Breast cancer screening - recommend monthly self breast exam and annual screening mammograms. Mammogram ordered today. Patient to schedule at [facility]   2)Refill of Vivelle Dot 0.0375 #24/RF x4  3) Vaginal cancer screening - Pap was done. ASCCP guidelines and rational discussed.  Recomended yearly screening interval due to history of cervical cancer  4)Colon cancer screening: UTD. Next colonoscopy due 2029  5) Routine healthcare maintenance including cholesterol and diabetes screening managed by PCP. Discussed calcium and vitamin D3 requirements and the role of exercise in preventing osteoporosis  6) RTO in 1 year and prn.  Dalia Heading, CNM

## 2020-02-09 LAB — CYTOLOGY - PAP
Comment: NEGATIVE
Diagnosis: NEGATIVE
High risk HPV: NEGATIVE

## 2020-02-16 DIAGNOSIS — M9905 Segmental and somatic dysfunction of pelvic region: Secondary | ICD-10-CM | POA: Diagnosis not present

## 2020-02-28 NOTE — Progress Notes (Signed)
Cardiology Office Note    Date:  03/02/2020   ID:  JAYLEY HUSTEAD, DOB 1966-08-16, MRN [id number]  PCP:  Lavera Guise, MD  Cardiologist:  Kathlyn Sacramento, MD  Electrophysiologist:  None   Chief Complaint: Follow-up  History of Present Illness:   Laura Barber is a 54 y.o. female with history of palpitations/PVCs, hypertension, hyperlipidemia, anxiety, and GERD who presents for follow-up ofPVCs.  Laura Barber was evaluated by Dr. Fletcher Anon on 04/09/2018 for palpitations and PVCs. Laura Barber reported onset of palpitations over the prior few months that were described as a fluttering sensation mostly at rest and most noticeable at night when Laura Barber was trying to sleep. There was associated mild shortness of breath. Prior 48-hour Holter monitor showed 2400 PVCs in 48 hours. Upon cardiology review of the monitor there was noted to be quite a bit of motion artifact which may have interfered with the count. Cardiology felt her PVC burden was much less than the reported 2400. Laura Barber reported a family history remarkable for CAD, though not prematurely. There was no family history of sudden death. Echo was obtained on 05/11/2018 showed an EF of 60 to 65%, no regional wall motion abnormalities, normal LV diastolic function, normal RV cavity size and systolic function. No significant valvular abnormalities.  Laura Barber was seen in the office on 09/10/2018 noting occasional dizziness/lightheadedness/ear fullness/flushing/sweating and associated fluttering that had gotten worse over the prior week.  It was noted Laura Barber continued to be able to exercise without issues.  In this setting, Laura Barber underwent 2 separate 14-day Zio monitors with each showing normal sinus rhythm with isolated PVCs with the first monitor showing an overall burden of 2.4% which was felt to be overestimated secondary to artifact.  The second Zio monitor showed sinus rhythm with rare PACs and PVCs with an overall burden of less than 1% with rare ventricular  couplets/triplets/ventricular bigeminy/trigeminy.  Laura Barber was last seen in 09/2018 noting an improvement in her tachypalpitations and flushing with the initiation of Lopressor.  Given ventricular ectopy noted on outpatient cardiac monitoring Laura Barber underwent ETT in 10/2018 which showed no evidence of ischemia and average exercise capacity with an exercise duration of 7 minutes and 38 seconds achieving a workload of 9.5 METs.  Laura Barber comes in doing reasonably well from a cardiac perspective.  No chest pain, dyspnea, dizziness, presyncope, syncope.  Laura Barber does continue to note intermittent palpitations with random need to take a deep inhalation.  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  No lower extremity swelling.  Laura Barber has done well throughout the Covid pandemic.  Tolerating metoprolol, atorvastatin, and Diovan HCT.   Labs independently reviewed: 08/2018 - TSH normal, Hgb 13.9, PLT 221, magnesium 2.0, potassium 4.2, BUN 18, serum creatinine 0.82 03/2018 - TC 196, TG 147, HDL 64, LDL 103, albumin 4.7, AST/ALT normal  Past Medical History:  Diagnosis Date  . Anxiety   . Cervical cancer, FIGO stage IB1 ([place]) 2005   Cervical  . GERD (gastroesophageal reflux disease)   . Headache   . Hyperlipidemia   . Hypertension   . Migraine   . Shingles 2019    Past Surgical History:  Procedure Laterality Date  . ABDOMINAL HYSTERECTOMY  2005   Radical hysterectomy by Dr Clarene Essex at [facility] cervical cancer 1B1  . BREAST CYST ASPIRATION Left 05/30/2013  . CERVICAL DISC ARTHROPLASTY Right 06/11/2016   Procedure: CERVICAL ANTERIOR [place] ARTHROPLASTY CERVICAL 5-7;  Surgeon: Blanche East, MD;  Location: [facility] ORS;  Service: Neurosurgery;  Laterality: Right;  .  DIAGNOSTIC LAPAROSCOPY  2005  . DILATION AND CURETTAGE OF UTERUS     SAB  . 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   Marland Kitchen NASAL SINUS SURGERY      Current Medications: Current Meds  Medication Sig  . ALPRAZolam (XANAX) 0.5 MG  tablet Take 1 tablet (0.5 mg total) by mouth at bedtime as needed for anxiety.  Marland Kitchen atorvastatin (LIPITOR) 10 MG tablet TAKE ONE (1) TABLET BY MOUTH ONCE DAILY AT 6 IN THE EVENING  . desonide (DESOWEN) 0.05 % cream APPLY A SMALL AMOUNT TO SKIN TWICE DAILYAS NEEDED  . estradiol (VIVELLE-DOT) 0.0375 MG/24HR APPLY 1 PATCH EXTERNALLY TO THE SKIN 2 TIMES A WEEK  . fluticasone (FLONASE) 50 MCG/ACT nasal spray Place 2 sprays into both nostrils daily.  . metoprolol tartrate (LOPRESSOR) 25 MG tablet TAKE 1/2 TABLET BY MOUTH TWICE DAILY  . Multiple Vitamin (MULTIVITAMIN) tablet Take 1 tablet by mouth daily.  . naproxen sodium (ANAPROX) 550 MG tablet Take 1 tablet (550 mg total) by mouth 2 (two) times daily with a meal.  . omeprazole (PRILOSEC) 20 MG capsule Take 1 capsule (20 mg total) by mouth daily.  . rizatriptan (MAXALT) 5 MG tablet Take 1 tablet (5 mg total) by mouth as needed for migraine.  . valACYclovir (VALTREX) 1000 MG tablet Take 1 tablet (1,000 mg total) by mouth as needed.  . valsartan-hydrochlorothiazide (DIOVAN-HCT) 80-12.5 MG tablet TAKE ONE (1) TABLET BY MOUTH ONCE DAILY  . venlafaxine XR (EFFEXOR-XR) 37.5 MG 24 hr capsule TAKE (1) CAPSULE BY MOUTH EVERY DAY WITHBREAKFAST    Allergies:   Claritin-d 12 hour [loratadine-pseudoephedrine er] and Sulfa antibiotics   Social History   Socioeconomic History  . Marital status: Divorced    Spouse name: Not on file  . Number of children: 0  . Years of education: Not on file  . Highest education level: Not on file  Occupational History  . Not on file  Tobacco Use  . Smoking status: Never Smoker  . Smokeless tobacco: Never Used  Vaping Use  . Vaping Use: Never used  Substance and Sexual Activity  . Alcohol use: Yes    Comment: occassional  . Drug use: No  . Sexual activity: Yes    Partners: Male    Birth control/protection: Surgical  Other Topics Concern  . Not on file  Social History Narrative  . Not on file   Social Determinants  of Health   Financial Resource Strain:   . Difficulty of Paying Living Expenses:   Food Insecurity:   . Worried About Charity fundraiser in the Last Year:   . Arboriculturist in the Last Year:   Transportation Needs:   . Film/video editor (Medical):   Marland Kitchen Lack of Transportation (Non-Medical):   Physical Activity:   . Days of Exercise per Week:   . Minutes of Exercise per Session:   Stress:   . Feeling of Stress :   Social Connections:   . Frequency of Communication with Friends and Family:   . Frequency of Social Gatherings with Friends and Family:   . Attends Religious Services:   . Active Member of Clubs or Organizations:   . Attends Archivist Meetings:   Marland Kitchen Marital Status:      Family History:  The patient's family history includes Diabetes in her father; Heart attack in her father; Heart disease in her father; Heart failure in her father; Hyperlipidemia in her brother  and mother; Hypertension in her father; Lung cancer in her mother. There is no history of Breast cancer.  ROS:   Review of Systems  Constitutional: Positive for malaise/fatigue. Negative for chills, diaphoresis, fever and weight loss.  HENT: Negative for congestion.   Eyes: Negative for discharge and redness.  Respiratory: Positive for shortness of breath. Negative for cough, sputum production and wheezing.        Intermittent isolated deep inhalation  Cardiovascular: Positive for palpitations. Negative for chest pain, orthopnea, claudication, leg swelling and PND.  Gastrointestinal: Negative for abdominal pain, heartburn, nausea and vomiting.  Musculoskeletal: Negative for falls and myalgias.  Skin: Negative for rash.  Neurological: Negative for dizziness, tingling, tremors, sensory change, speech change, focal weakness, loss of consciousness and weakness.  Endo/Heme/Allergies: Does not bruise/bleed easily.  Psychiatric/Behavioral: Negative for substance abuse. The patient is not  nervous/anxious.   All other systems reviewed and are negative.    EKGs/Labs/Other Studies Reviewed:    Studies reviewed were summarized above. The additional studies were reviewed today: As above.  EKG:  EKG is ordered today.  The EKG ordered today demonstrates NSR, 66 bpm, occasional PVCs, no acute ST-T changes  Recent Labs: No results found for requested labs within last 8760 hours.  Recent Lipid Panel    Component Value Date/Time   CHOL 196 03/29/2018 0807   TRIG 147 03/29/2018 0807   HDL 64 03/29/2018 0807   LDLCALC 103 (H) 03/29/2018 0807    PHYSICAL EXAM:    VS:  BP 110/62 (BP Location: Left Arm, Patient Position: Sitting, Cuff Size: Normal)   Pulse 66   Ht 5\' 10"  (1.778 m)   Wt 178 lb (80.7 kg)   SpO2 98%   BMI 25.54 kg/m   BMI: Body mass index is 25.54 kg/m.  Physical Exam Constitutional:      Appearance: Laura Barber is well-developed.  HENT:     Head: Normocephalic and atraumatic.  Eyes:     General:        Right eye: No discharge.        Left eye: No discharge.  Neck:     Vascular: No JVD.  Cardiovascular:     Rate and Rhythm: Normal rate and regular rhythm.     Pulses: No midsystolic click and no opening snap.          Posterior tibial pulses are 2+ on the right side and 2+ on the left side.     Heart sounds: Normal heart sounds, S1 normal and S2 normal. Heart sounds not distant. No murmur heard.  No friction rub.  Pulmonary:     Effort: Pulmonary effort is normal. No respiratory distress.     Breath sounds: Normal breath sounds. No decreased breath sounds, wheezing or rales.  Chest:     Chest wall: No tenderness.  Abdominal:     General: There is no distension.     Palpations: Abdomen is soft.     Tenderness: There is no abdominal tenderness.  Musculoskeletal:     Cervical back: Normal range of motion.  Skin:    General: Skin is warm and dry.     Nails: There is no clubbing.  Neurological:     Mental Status: Laura Barber is alert and oriented to person,  place, and time.  Psychiatric:        Speech: Speech normal.        Behavior: Behavior normal.        Thought Content: Thought content normal.  Judgment: Judgment normal.     Wt Readings from Last 3 Encounters:  03/02/20 178 lb (80.7 kg)  02/07/20 184 lb (83.5 kg)  01/31/20 186 lb (84.4 kg)     ASSESSMENT & PLAN:   1. Palpitations/PVCs: Laura Barber does continue to note symptoms concerning for occasional frequent PVCs.  On twelve-lead EKG today Laura Barber has occasional isolated PVCs indicating these are not fully suppressed with current dose metoprolol.  Prior echo and GXT were unrevealing.  Check BMP, magnesium, and TSH with recommendation to correct any significant abnormalities.  If these are unrevealing we will titrate her metoprolol to 25 mg twice daily and in the setting taper her Diovan HCT in an effort to prevent significant hypotension.  Maintain adequate hydration.  2. HTN: Blood pressure is well controlled in the office today.  If we need to titrate her metoprolol as outlined above we will taper her Diovan HCT in an effort to avoid significant hypotension.  Disposition: F/u with Dr. Fletcher Anon or an APP in 3 months.   Medication Adjustments/Labs and Tests Ordered: Current medicines are reviewed at length with the patient today.  Concerns regarding medicines are outlined above. Medication changes, Labs and Tests ordered today are summarized above and listed in the Patient Instructions accessible in Encounters.   Signed, Christell Faith, PA-C 03/02/2020 4:04 PM     [address] [place], [place] [address] [phone]

## 2020-03-02 ENCOUNTER — Other Ambulatory Visit
Admission: RE | Admit: 2020-03-02 | Discharge: 2020-03-02 | Disposition: A | Discharge status: Home / Self Care | Payer: BC Managed Care – PPO | Source: Ambulatory Visit | Attending: Physician Assistant | Admitting: Physician Assistant

## 2020-03-02 ENCOUNTER — Ambulatory Visit (INDEPENDENT_AMBULATORY_CARE_PROVIDER_SITE_OTHER): Payer: BC Managed Care – PPO | Admitting: Physician Assistant

## 2020-03-02 ENCOUNTER — Other Ambulatory Visit: Payer: Self-pay

## 2020-03-02 ENCOUNTER — Encounter: Payer: Self-pay | Admitting: Physician Assistant

## 2020-03-02 VITALS — BP 110/62 | HR 66 | Ht 70.0 in | Wt 178.0 lb

## 2020-03-02 DIAGNOSIS — I10 Essential (primary) hypertension: Secondary | ICD-10-CM

## 2020-03-02 DIAGNOSIS — I493 Ventricular premature depolarization: Secondary | ICD-10-CM

## 2020-03-02 DIAGNOSIS — R002 Palpitations: Secondary | ICD-10-CM

## 2020-03-02 LAB — MAGNESIUM: Magnesium: 2 mg/dL (ref 1.7–2.4)

## 2020-03-02 LAB — BASIC METABOLIC PANEL
Anion gap: 9 (ref 5–15)
BUN: 19 mg/dL (ref 6–20)
CO2: 27 mmol/L (ref 22–32)
Calcium: 9.4 mg/dL (ref 8.9–10.3)
Chloride: 102 mmol/L (ref 98–111)
Creatinine, Ser: 1.25 mg/dL — ABNORMAL HIGH (ref 0.44–1.00)
GFR calc Af Amer: 57 mL/min — ABNORMAL LOW (ref >60)
GFR calc non Af Amer: 49 mL/min — ABNORMAL LOW (ref >60)
Glucose, Bld: 99 mg/dL (ref 70–99)
Potassium: 4 mmol/L (ref 3.5–5.1)
Sodium: 138 mmol/L (ref 135–145)

## 2020-03-02 LAB — TSH: TSH: 1.868 u[IU]/mL (ref 0.350–4.500)

## 2020-03-02 NOTE — Addendum Note (Signed)
Addended by: Valora Corporal on: 03/02/2020 04:26 PM   Modules accepted: Orders

## 2020-03-02 NOTE — Patient Instructions (Addendum)
Medication Instructions:  No changes  *If you need a refill on your cardiac medications before your next appointment, please call your pharmacy*   Lab Work: BMET, Mag, TSH at the [facility].  If you have labs (blood work) drawn today and your tests are completely normal, you will receive your results only by:  [place] (if you have MyChart) OR  A paper copy in the mail If you have any lab test that is abnormal or we need to change your treatment, we will call you to review the results.   Testing/Procedures: None   Follow-Up: At [facility], you and your health needs are our priority.  As part of our continuing mission to provide you with exceptional heart care, we have created designated Provider Care Teams.  These Care Teams include your primary Cardiologist (physician) and Advanced Practice Providers (APPs -  Physician Assistants and Nurse Practitioners) who all work together to provide you with the care you need, when you need it.  Your next appointment:   3 month(s)  The format for your next appointment:   In Person  Provider:    You may see Kathlyn Sacramento, MD or one of the following Advanced Practice Providers on your designated Care Team:    Murray Hodgkins, NP  Christell Faith, PA-C  Marrianne Mood, PA-C

## 2020-03-15 DIAGNOSIS — M9905 Segmental and somatic dysfunction of pelvic region: Secondary | ICD-10-CM | POA: Diagnosis not present

## 2020-03-29 DIAGNOSIS — R002 Palpitations: Secondary | ICD-10-CM

## 2020-03-30 NOTE — Telephone Encounter (Signed)
Call to patient to discuss patient message.   She reported that she did see results but missed the comment from provider.   I reviewed recommendations and placed order for repeat labs.   Pt reports that she takes NSAIDS for migraines. She agrees to reduce but wonders if there is an alternative that may be more appropriate.

## 2020-04-02 ENCOUNTER — Other Ambulatory Visit: Payer: Self-pay

## 2020-04-02 ENCOUNTER — Other Ambulatory Visit
Admission: RE | Admit: 2020-04-02 | Discharge: 2020-04-02 | Disposition: A | Discharge status: Home / Self Care | Payer: BC Managed Care – PPO | Attending: Physician Assistant | Admitting: Physician Assistant

## 2020-04-02 DIAGNOSIS — R002 Palpitations: Secondary | ICD-10-CM | POA: Insufficient documentation

## 2020-04-02 LAB — BASIC METABOLIC PANEL
Anion gap: 10 (ref 5–15)
BUN: 22 mg/dL — ABNORMAL HIGH (ref 6–20)
CO2: 26 mmol/L (ref 22–32)
Calcium: 9.2 mg/dL (ref 8.9–10.3)
Chloride: 102 mmol/L (ref 98–111)
Creatinine, Ser: 0.9 mg/dL (ref 0.44–1.00)
GFR calc Af Amer: >60 mL/min (ref >60)
GFR calc non Af Amer: >60 mL/min (ref >60)
Glucose, Bld: 87 mg/dL (ref 70–99)
Potassium: 4.2 mmol/L (ref 3.5–5.1)
Sodium: 138 mmol/L (ref 135–145)

## 2020-04-03 ENCOUNTER — Other Ambulatory Visit: Payer: Self-pay | Admitting: Certified Nurse Midwife

## 2020-04-12 DIAGNOSIS — M9905 Segmental and somatic dysfunction of pelvic region: Secondary | ICD-10-CM | POA: Diagnosis not present

## 2020-04-17 ENCOUNTER — Other Ambulatory Visit: Payer: Self-pay

## 2020-04-17 ENCOUNTER — Ambulatory Visit
Admission: RE | Admit: 2020-04-17 | Discharge: 2020-04-17 | Disposition: A | Discharge status: Home / Self Care | Payer: BC Managed Care – PPO | Source: Ambulatory Visit | Attending: Family Medicine | Admitting: Family Medicine

## 2020-04-17 VITALS — BP 132/70 | HR 54 | Temp 98.2°F | Resp 18 | Ht 70.0 in | Wt 168.0 lb

## 2020-04-17 DIAGNOSIS — H9209 Otalgia, unspecified ear: Secondary | ICD-10-CM | POA: Diagnosis not present

## 2020-04-17 DIAGNOSIS — Z20822 Contact with and (suspected) exposure to covid-19: Secondary | ICD-10-CM | POA: Diagnosis not present

## 2020-04-17 DIAGNOSIS — Z882 Allergy status to sulfonamides status: Secondary | ICD-10-CM | POA: Diagnosis not present

## 2020-04-17 DIAGNOSIS — I10 Essential (primary) hypertension: Secondary | ICD-10-CM | POA: Insufficient documentation

## 2020-04-17 DIAGNOSIS — F419 Anxiety disorder, unspecified: Secondary | ICD-10-CM | POA: Diagnosis not present

## 2020-04-17 DIAGNOSIS — K219 Gastro-esophageal reflux disease without esophagitis: Secondary | ICD-10-CM | POA: Insufficient documentation

## 2020-04-17 DIAGNOSIS — J029 Acute pharyngitis, unspecified: Secondary | ICD-10-CM | POA: Diagnosis not present

## 2020-04-17 DIAGNOSIS — E785 Hyperlipidemia, unspecified: Secondary | ICD-10-CM | POA: Insufficient documentation

## 2020-04-17 DIAGNOSIS — Z79890 Hormone replacement therapy (postmenopausal): Secondary | ICD-10-CM | POA: Diagnosis not present

## 2020-04-17 DIAGNOSIS — Z79899 Other long term (current) drug therapy: Secondary | ICD-10-CM | POA: Insufficient documentation

## 2020-04-17 LAB — GROUP A STREP BY PCR: Group A Strep by PCR: NOT DETECTED

## 2020-04-17 NOTE — ED Triage Notes (Signed)
Patient c/o sore throat, ear pain and headache that started yesterday.

## 2020-04-17 NOTE — ED Provider Notes (Signed)
[facility]    CSN: [id number] Arrival date & time: 04/17/20  1208      History   Chief Complaint Chief Complaint  Patient presents with  . Appointment  . Sore Throat  . Otalgia    HPI Laura Barber is a 54 y.o. female.   HPI  54 year old female presents with a 1 day history of sore throat ear pain and headache. Dates that her ears feel full. Throat pain has been persistent. She states that she will sleep with her mouth open and her throat is usually dry in the morning but tends to improve as the day goes on. Had no sick contacts. Received both of her Covid vaccinations with Pfizer around April. She denies any fever chills. She has not had a significant cough and does not feel bad and does not complain of fever or body aches.       Past Medical History:  Diagnosis Date  . Anxiety   . Cervical cancer, FIGO stage IB1 ([place]) 2005   Cervical  . GERD (gastroesophageal reflux disease)   . Headache   . Hyperlipidemia   . Hypertension   . Migraine   . Shingles 2019    Patient Active Problem List   Diagnosis Date Noted  . Anxiety 01/25/2019  . Palpitations 01/25/2019  . Hypertension   . Hyperlipidemia   . GERD (gastroesophageal reflux disease)   . Cervical cancer, FIGO stage IB1 ([place]) 01/14/2017  . Cervical stenosis of spine 06/11/2016  . Lump or mass in breast 05/30/2013    Past Surgical History:  Procedure Laterality Date  . ABDOMINAL HYSTERECTOMY  2005   Radical hysterectomy by Dr Clarene Essex at [facility] cervical cancer 1B1  . BREAST CYST ASPIRATION Left 05/30/2013  . CERVICAL DISC ARTHROPLASTY Right 06/11/2016   Procedure: CERVICAL ANTERIOR [place] ARTHROPLASTY CERVICAL 5-7;  Surgeon: Blanche East, MD;  Location: [facility] ORS;  Service: Neurosurgery;  Laterality: Right;  . DIAGNOSTIC LAPAROSCOPY  2005  . DILATION AND CURETTAGE OF UTERUS     SAB  . 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   Marland Kitchen NASAL SINUS SURGERY       OB History    Gravida  1   Para      Term      Preterm      AB  1   Living  0     SAB  1   TAB      Ectopic      Multiple      Live Births           Obstetric Comments  Menstrual age: 30  Age 1st Pregnancy: N/A         Home Medications    Prior to Admission medications   Medication Sig Start Date End Date Taking? Authorizing Provider  ALPRAZolam Duanne Moron) 0.5 MG tablet Take 1 tablet (0.5 mg total) by mouth at bedtime as needed for anxiety. 11/01/19  Yes Scarboro, Audie Clear, NP  atorvastatin (LIPITOR) 10 MG tablet TAKE ONE (1) TABLET BY MOUTH ONCE DAILY AT 6 IN THE EVENING 11/01/19  Yes Scarboro, Audie Clear, NP  desonide (DESOWEN) 0.05 % cream APPLY A SMALL AMOUNT TO SKIN TWICE DAILYAS NEEDED 11/01/19  Yes Kendell Bane, NP  estradiol (VIVELLE-DOT) 0.0375 MG/24HR APPLY 1 PATCH EXTERNALLY TO THE SKIN 2 TIMES A WEEK 04/04/20  Yes Dalia Heading, CNM  fluticasone (FLONASE) 50 MCG/ACT nasal spray Place 2 sprays into both nostrils daily. 07/15/19  Yes Melynda Ripple, MD  metoprolol tartrate (LOPRESSOR) 25 MG tablet TAKE 1/2 TABLET BY MOUTH TWICE DAILY 06/29/19  Yes Rise Mu, PA-C  Multiple Vitamin (MULTIVITAMIN) tablet Take 1 tablet by mouth daily.   Yes [provider]  naproxen sodium (ANAPROX) 550 MG tablet Take 1 tablet (550 mg total) by mouth 2 (two) times daily with a meal. 11/01/19  Yes Scarboro, Audie Clear, NP  omeprazole (PRILOSEC) 20 MG capsule Take 1 capsule (20 mg total) by mouth daily. 03/26/18  Yes Scarboro, Audie Clear, NP  rizatriptan (MAXALT) 5 MG tablet Take 1 tablet (5 mg total) by mouth as needed for migraine. 11/01/19  Yes Scarboro, Audie Clear, NP  valACYclovir (VALTREX) 1000 MG tablet Take 1 tablet (1,000 mg total) by mouth as needed. 11/01/19  Yes Scarboro, Audie Clear, NP  valsartan-hydrochlorothiazide (DIOVAN-HCT) 80-12.5 MG tablet TAKE ONE (1) TABLET BY MOUTH ONCE DAILY 11/01/19  Yes Kendell Bane, NP  venlafaxine XR (EFFEXOR-XR) 37.5 MG 24 hr capsule TAKE  (1) CAPSULE BY MOUTH EVERY DAY WITHBREAKFAST 11/01/19  Yes Scarboro, Audie Clear, NP  cetirizine (ZYRTEC) 10 MG tablet Take 10 mg by mouth daily.  07/15/19  [provider]    Family History Family History  Problem Relation Age of Onset  . Lung cancer Mother   . Hyperlipidemia Mother   . Diabetes Father   . Hypertension Father   . Heart failure Father   . Heart attack Father   . Heart disease Father   . Hyperlipidemia Brother   . Breast cancer Neg Hx     Social History Social History   Tobacco Use  . Smoking status: Never Smoker  . Smokeless tobacco: Never Used  Vaping Use  . Vaping Use: Never used  Substance Use Topics  . Alcohol use: Yes    Comment: occassional  . Drug use: No     Allergies   Claritin-d 12 hour [loratadine-pseudoephedrine er] and Sulfa antibiotics   Review of Systems Review of Systems  Constitutional: Positive for activity change. Negative for appetite change, chills, diaphoresis, fatigue and fever.  HENT: Positive for ear pain, postnasal drip and sore throat.   All other systems reviewed and are negative.    Physical Exam Triage Vital Signs ED Triage Vitals  Enc Vitals Group     BP 04/17/20 1239 132/70     Pulse Rate 04/17/20 1239 (!) 54     Resp 04/17/20 1239 18     Temp 04/17/20 1239 98.2 F (36.8 C)     Temp Source 04/17/20 1239 Oral     SpO2 04/17/20 1239 100 %     Weight 04/17/20 1236 168 lb (76.2 kg)     Height 04/17/20 1236 5\' 10"  (1.778 m)     Head Circumference --      Peak Flow --      Pain Score 04/17/20 1235 5     Pain Loc --      Pain Edu? --      Excl. in [place]? --    No data found.  Updated Vital Signs BP 132/70 (BP Location: Right Arm)   Pulse (!) 54   Temp 98.2 F (36.8 C) (Oral)   Resp 18   Ht 5\' 10"  (1.778 m)   Wt 168 lb (76.2 kg)   SpO2 100%   BMI 24.11 kg/m   Visual Acuity Right Eye Distance:   Left Eye Distance:   Bilateral Distance:    Right Eye Near:   Left  Eye Near:    Bilateral Near:      Physical Exam Vitals and nursing note reviewed.  Constitutional:      General: She is not in acute distress.    Appearance: She is well-developed. She is not ill-appearing or toxic-appearing.  HENT:     Head: Normocephalic and atraumatic.     Right Ear: Tympanic membrane and ear canal normal.     Left Ear: Tympanic membrane and ear canal normal.     Nose: No congestion or rhinorrhea.     Mouth/Throat:     Mouth: Mucous membranes are moist.     Pharynx: Oropharynx is clear.     Tonsils: No tonsillar exudate or tonsillar abscesses.  Eyes:     Conjunctiva/sclera: Conjunctivae normal.  Cardiovascular:     Rate and Rhythm: Normal rate and regular rhythm.  Pulmonary:     Effort: Pulmonary effort is normal.     Breath sounds: Normal breath sounds.  Musculoskeletal:     Cervical back: Normal range of motion and neck supple.  Skin:    General: Skin is warm and dry.  Neurological:     General: No focal deficit present.     Mental Status: She is alert and oriented to person, place, and time.  Psychiatric:        Mood and Affect: Mood normal.        Behavior: Behavior normal.      UC Treatments / Results  Labs (all labs ordered are listed, but only abnormal results are displayed) Labs Reviewed  GROUP A STREP BY PCR  SARS CORONAVIRUS 2 (TAT 6-24 HRS)    EKG   Radiology No results found.  Procedures Procedures (including critical care time)  Medications Ordered in UC Medications - No data to display  Initial Impression / Assessment and Plan / UC Course  I have reviewed the triage vital signs and the nursing notes.  Pertinent labs & imaging results that were available during my care of the patient were reviewed by me and considered in my medical decision making (see chart for details).   54 year old female presents with a 1 day history of sore throat ear pain and headache.  Has received vaccinations of [place] for Covid prevention in April.  Denies any fatigue body  aches fever chills or significant coughing.  Examination was normal.  Tested for strep which was also negative and for Covid 19 which is pending at this time.  Because of the negative strep test it was felt that she had a viral pharyngitis.  She was given instructions on symptomatic care including lozenges or gargling with warm salt water.  Recommended Flonase nasal spray on a daily basis as well as sideration for Zyrtec-D Allegra-D or Claritin-D.  Results of the Covid testing will be noted in her MyChart.  If it is positive she will remain quarantined and contact her private physician for further evaluation and instructions.  If it is negative then she will treat her sore throat symptomatically.  If it is not improving she should follow-up with her primary care physician.   Final Clinical Impressions(s) / UC Diagnoses   Final diagnoses:  Sore throat   Discharge Instructions   None    ED Prescriptions    None     PDMP not reviewed this encounter.   Lorin Picket, PA-C 04/17/20 1412

## 2020-04-18 LAB — SARS CORONAVIRUS 2 (TAT 6-24 HRS): SARS Coronavirus 2: NEGATIVE

## 2020-04-25 ENCOUNTER — Other Ambulatory Visit: Payer: Self-pay | Admitting: Adult Health

## 2020-04-25 DIAGNOSIS — F411 Generalized anxiety disorder: Secondary | ICD-10-CM

## 2020-05-01 ENCOUNTER — Telehealth: Payer: Self-pay

## 2020-05-01 ENCOUNTER — Other Ambulatory Visit: Payer: Self-pay | Admitting: Adult Health

## 2020-05-01 DIAGNOSIS — I10 Essential (primary) hypertension: Secondary | ICD-10-CM

## 2020-05-01 NOTE — Telephone Encounter (Signed)
Confirmed and screened for 05-03-20 ov.

## 2020-05-01 NOTE — Telephone Encounter (Signed)
Pt has an appointment on the [facility]

## 2020-05-03 ENCOUNTER — Ambulatory Visit: Payer: BC Managed Care – PPO | Admitting: Hospice and Palliative Medicine

## 2020-05-03 ENCOUNTER — Other Ambulatory Visit: Payer: Self-pay

## 2020-05-03 ENCOUNTER — Encounter: Payer: Self-pay | Admitting: Hospice and Palliative Medicine

## 2020-05-03 DIAGNOSIS — F411 Generalized anxiety disorder: Secondary | ICD-10-CM

## 2020-05-03 DIAGNOSIS — I10 Essential (primary) hypertension: Secondary | ICD-10-CM

## 2020-05-03 DIAGNOSIS — Z0001 Encounter for general adult medical examination with abnormal findings: Secondary | ICD-10-CM

## 2020-05-03 DIAGNOSIS — Z1231 Encounter for screening mammogram for malignant neoplasm of breast: Secondary | ICD-10-CM

## 2020-05-03 DIAGNOSIS — I493 Ventricular premature depolarization: Secondary | ICD-10-CM

## 2020-05-03 NOTE — Progress Notes (Signed)
[facility] [place], [place] [address]  Internal MEDICINE  Office Visit Note  Patient Name: Laura Barber  [id number]  [id number]  Date of Service: 05/07/2020  Chief Complaint  Patient presents with  . Follow-up  . Hyperlipidemia  . Hypertension  . Quality Metric Gaps    HepC, TDAP, HIVscreening, flu shot    HPI Patient is here for routine follow-up She was recently seen by urgent care, diagnosed with viral pharyngitis, she says she is feeling much better and back to her normal Overall, she says her health has been good, no new diagnoses or medications She is managing her anxiety well, her migraines are well controlled, no acute issues to discuss today Was seen by her cardiologist earlier this summer for routine follow-up on heart palpitations or PVCs, cardiologist mentioned increasing her dose of metoprolol as she occasionally still feels PVCs, denies shortness of breath, headaches or dizziness  Current Medication: Outpatient Encounter Medications as of 05/03/2020  Medication Sig Note  . ALPRAZolam (XANAX) 0.5 MG tablet Take 1 tablet (0.5 mg total) by mouth at bedtime as needed for anxiety.   Marland Kitchen atorvastatin (LIPITOR) 10 MG tablet TAKE ONE (1) TABLET BY MOUTH ONCE DAILY AT 6 IN THE EVENING   . desonide (DESOWEN) 0.05 % cream APPLY A SMALL AMOUNT TO SKIN TWICE DAILYAS NEEDED   . estradiol (VIVELLE-DOT) 0.0375 MG/24HR APPLY 1 PATCH EXTERNALLY TO THE SKIN 2 TIMES A WEEK   . fluticasone (FLONASE) 50 MCG/ACT nasal spray Place 2 sprays into both nostrils daily.   . metoprolol tartrate (LOPRESSOR) 25 MG tablet TAKE 1/2 TABLET BY MOUTH TWICE DAILY   . Multiple Vitamin (MULTIVITAMIN) tablet Take 1 tablet by mouth daily.   . naproxen sodium (ANAPROX) 550 MG tablet Take 1 tablet (550 mg total) by mouth 2 (two) times daily with a meal.   . omeprazole (PRILOSEC) 20 MG capsule Take 1 capsule (20 mg total) by mouth daily.   . rizatriptan (MAXALT) 5 MG tablet Take 1  tablet (5 mg total) by mouth as needed for migraine. 02/07/2020: Rarely  . valACYclovir (VALTREX) 1000 MG tablet Take 1 tablet (1,000 mg total) by mouth as needed.   . valsartan-hydrochlorothiazide (DIOVAN-HCT) 80-12.5 MG tablet TAKE (1) TABLET BY MOUTH ONCE DAILY.   Marland Kitchen venlafaxine XR (EFFEXOR-XR) 37.5 MG 24 hr capsule TAKE (1) CAPSULE BY MOUTH EVERY DAY WITHBREAKFAST.   . [DISCONTINUED] cetirizine (ZYRTEC) 10 MG tablet Take 10 mg by mouth daily.    No facility-administered encounter medications on file as of 05/03/2020.    Surgical History: Past Surgical History:  Procedure Laterality Date  . ABDOMINAL HYSTERECTOMY  2005   Radical hysterectomy by Dr Clarene Essex at [facility] cervical cancer 1B1  . BREAST CYST ASPIRATION Left 05/30/2013  . CERVICAL DISC ARTHROPLASTY Right 06/11/2016   Procedure: CERVICAL ANTERIOR [place] ARTHROPLASTY CERVICAL 5-7;  Surgeon: Blanche East, MD;  Location: [facility] ORS;  Service: Neurosurgery;  Laterality: Right;  . DIAGNOSTIC LAPAROSCOPY  2005  . DILATION AND CURETTAGE OF UTERUS     SAB  . 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   Marland Kitchen NASAL SINUS SURGERY      Medical History: Past Medical History:  Diagnosis Date  . Anxiety   . Cervical cancer, FIGO stage IB1 ([place]) 2005   Cervical  . GERD (gastroesophageal reflux disease)   . Headache   . Hyperlipidemia   . Hypertension   . Migraine   . Shingles 2019  Family History: Family History  Problem Relation Age of Onset  . Lung cancer Mother   . Hyperlipidemia Mother   . Diabetes Father   . Hypertension Father   . Heart failure Father   . Heart attack Father   . Heart disease Father   . Hyperlipidemia Brother   . Breast cancer Neg Hx     Social History   Socioeconomic History  . Marital status: Divorced    Spouse name: Not on file  . Number of children: 0  . Years of education: Not on file  . Highest education level: Not on file  Occupational History  . Not on file   Tobacco Use  . Smoking status: Never Smoker  . Smokeless tobacco: Never Used  Vaping Use  . Vaping Use: Never used  Substance and Sexual Activity  . Alcohol use: Yes    Comment: occassional  . Drug use: No  . Sexual activity: Yes    Partners: Male    Birth control/protection: Surgical  Other Topics Concern  . Not on file  Social History Narrative  . Not on file   Social Determinants of Health   Financial Resource Strain:   . Difficulty of Paying Living Expenses: Not on file  Food Insecurity:   . Worried About Charity fundraiser in the Last Year: Not on file  . Ran Out of Food in the Last Year: Not on file  Transportation Needs:   . Lack of Transportation (Medical): Not on file  . Lack of Transportation (Non-Medical): Not on file  Physical Activity:   . Days of Exercise per Week: Not on file  . Minutes of Exercise per Session: Not on file  Stress:   . Feeling of Stress : Not on file  Social Connections:   . Frequency of Communication with Friends and Family: Not on file  . Frequency of Social Gatherings with Friends and Family: Not on file  . Attends Religious Services: Not on file  . Active Member of Clubs or Organizations: Not on file  . Attends Archivist Meetings: Not on file  . Marital Status: Not on file  Intimate Partner Violence:   . Fear of Current or Ex-Partner: Not on file  . Emotionally Abused: Not on file  . Physically Abused: Not on file  . Sexually Abused: Not on file    Review of Systems  Constitutional: Negative for chills, diaphoresis and fatigue.  HENT: Negative for ear pain, postnasal drip and sinus pressure.   Eyes: Negative for photophobia, discharge, redness, itching and visual disturbance.  Respiratory: Negative for cough, shortness of breath and wheezing.   Cardiovascular: Positive for palpitations. Negative for chest pain and leg swelling.  Gastrointestinal: Negative for abdominal pain, constipation, diarrhea, nausea and  vomiting.  Genitourinary: Negative for dysuria and flank pain.  Musculoskeletal: Negative for arthralgias, back pain, gait problem and neck pain.  Skin: Negative for color change.  Allergic/Immunologic: Negative for environmental allergies and food allergies.  Neurological: Negative for dizziness and headaches.  Hematological: Does not bruise/bleed easily.  Psychiatric/Behavioral: Negative for agitation, behavioral problems (depression) and hallucinations.    Vital Signs: BP 102/74   Pulse 63   Temp (!) 97.2 F (36.2 C)   Resp 16   Ht 5\' 10"  (1.778 m)   Wt 165 lb 12.8 oz (75.2 kg)   SpO2 97%   BMI 23.79 kg/m    Physical Exam Vitals reviewed.  Constitutional:      Appearance: Normal appearance.  She is normal weight.  HENT:     Mouth/Throat:     Mouth: Mucous membranes are moist.     Pharynx: Oropharynx is clear.  Cardiovascular:     Rate and Rhythm: Normal rate and regular rhythm.     Pulses: Normal pulses.     Heart sounds: Normal heart sounds.  Pulmonary:     Effort: Pulmonary effort is normal.     Breath sounds: Normal breath sounds.  Abdominal:     General: Abdomen is flat.     Palpations: Abdomen is soft.  Musculoskeletal:        General: Normal range of motion.     Cervical back: Normal range of motion.  Skin:    General: Skin is warm.  Neurological:     General: No focal deficit present.     Mental Status: She is alert and oriented to person, place, and time. Mental status is at baseline.  Psychiatric:        Mood and Affect: Mood normal.        Behavior: Behavior normal.        Thought Content: Thought content normal.    Assessment/Plan: 1. Hypertension, unspecified type BP and HR well controlled today, continue with current therapy and routine monitoring.  2. Frequent PVCs Followed by cardiology, discussions about increasing her metoprolol dose, will need to follow-up with cardiology for dose increase. Will repeat echo, last in 2019 normal, will  assess for cardiac etiology on echo and adjust plan of care accordingly. - ECHOCARDIOGRAM COMPLETE; Future  3. Generalized anxiety disorder Well controlled at this time, continue current therapy and routine monitoring.  4. Encounter for screening mammogram for malignant neoplasm of breast - MM Digital Screening; Future  Annual labs ordered for upcoming CPE - CBC w/Diff/Platelet - Comprehensive Metabolic Panel (CMET) - Lipid Panel With LDL/HDL Ratio - TSH + free T4 - Vitamin D 1,25 dihydroxy - B12  General Counseling: Myisha verbalizes understanding of the findings of todays visit and agrees with plan of treatment. I have discussed any further diagnostic evaluation that may be needed or ordered today. We also reviewed her medications today. she has been encouraged to call the office with any questions or concerns that should arise related to todays visit.    Orders Placed This Encounter  Procedures  . MM Digital Screening  . CBC w/Diff/Platelet  . Comprehensive Metabolic Panel (CMET)  . Lipid Panel With LDL/HDL Ratio  . TSH + free T4  . Vitamin D 1,25 dihydroxy  . B12  . ECHOCARDIOGRAM COMPLETE    Time spent: 30 Minutes Time spent includes review of chart, medications, test results and follow-up plan with the patient.  This patient was seen by Theodoro Grist AGNP-C in Collaboration with Dr Lavera Guise as a part of collaborative care agreement     Tanna Furry. Alicia Ackert AGNP-C Internal medicine

## 2020-05-07 ENCOUNTER — Encounter: Payer: Self-pay | Admitting: Hospice and Palliative Medicine

## 2020-05-10 DIAGNOSIS — M9905 Segmental and somatic dysfunction of pelvic region: Secondary | ICD-10-CM | POA: Diagnosis not present

## 2020-05-14 ENCOUNTER — Telehealth: Payer: Self-pay

## 2020-05-14 NOTE — Telephone Encounter (Signed)
CONFIRMED PATIENT APPOINTMENT 05/16/20

## 2020-05-16 ENCOUNTER — Ambulatory Visit: Payer: BC Managed Care – PPO

## 2020-05-16 ENCOUNTER — Other Ambulatory Visit: Payer: Self-pay

## 2020-05-16 DIAGNOSIS — I493 Ventricular premature depolarization: Secondary | ICD-10-CM | POA: Diagnosis not present

## 2020-05-17 ENCOUNTER — Encounter: Payer: Self-pay | Admitting: Hospice and Palliative Medicine

## 2020-05-17 ENCOUNTER — Other Ambulatory Visit: Payer: Self-pay | Admitting: Hospice and Palliative Medicine

## 2020-05-17 DIAGNOSIS — F411 Generalized anxiety disorder: Secondary | ICD-10-CM

## 2020-05-17 DIAGNOSIS — I10 Essential (primary) hypertension: Secondary | ICD-10-CM

## 2020-05-17 DIAGNOSIS — E785 Hyperlipidemia, unspecified: Secondary | ICD-10-CM

## 2020-05-17 MED ORDER — VALSARTAN-HYDROCHLOROTHIAZIDE 80-12.5 MG PO TABS: ORAL_TABLET | ORAL | 0 refills | Status: DC

## 2020-05-17 MED ORDER — VENLAFAXINE HCL ER 37.5 MG PO CP24: ORAL_CAPSULE | ORAL | 0 refills | Status: DC

## 2020-05-17 MED ORDER — ATORVASTATIN CALCIUM 10 MG PO TABS: ORAL_TABLET | ORAL | 5 refills | Status: DC

## 2020-05-17 NOTE — Telephone Encounter (Signed)
Hey. I got this message addressed to you.

## 2020-05-18 DIAGNOSIS — Z1231 Encounter for screening mammogram for malignant neoplasm of breast: Secondary | ICD-10-CM | POA: Diagnosis not present

## 2020-05-18 DIAGNOSIS — Z0001 Encounter for general adult medical examination with abnormal findings: Secondary | ICD-10-CM | POA: Diagnosis not present

## 2020-05-29 ENCOUNTER — Other Ambulatory Visit: Payer: Self-pay | Admitting: Hospice and Palliative Medicine

## 2020-05-29 DIAGNOSIS — Z1231 Encounter for screening mammogram for malignant neoplasm of breast: Secondary | ICD-10-CM

## 2020-05-29 LAB — COMPREHENSIVE METABOLIC PANEL
ALT: 30 IU/L (ref 0–32)
AST: 22 IU/L (ref 0–40)
Albumin/Globulin Ratio: 1.8 (ref 1.2–2.2)
Albumin: 4.4 g/dL (ref 3.8–4.9)
Alkaline Phosphatase: 54 IU/L (ref 44–121)
BUN/Creatinine Ratio: 21 (ref 9–23)
BUN: 19 mg/dL (ref 6–24)
Bilirubin Total: 0.6 mg/dL (ref 0.0–1.2)
CO2: 23 mmol/L (ref 20–29)
Calcium: 9.4 mg/dL (ref 8.7–10.2)
Chloride: 99 mmol/L (ref 96–106)
Creatinine, Ser: 0.92 mg/dL (ref 0.57–1.00)
GFR calc Af Amer: 82 mL/min/{1.73_m2} (ref >59)
GFR calc non Af Amer: 71 mL/min/{1.73_m2} (ref >59)
Globulin, Total: 2.5 g/dL (ref 1.5–4.5)
Glucose: 88 mg/dL (ref 65–99)
Potassium: 4.3 mmol/L (ref 3.5–5.2)
Sodium: 138 mmol/L (ref 134–144)
Total Protein: 6.9 g/dL (ref 6.0–8.5)

## 2020-05-29 LAB — CBC WITH DIFFERENTIAL/PLATELET
Basophils Absolute: 0 10*3/uL (ref 0.0–0.2)
Basos: 1 %
EOS (ABSOLUTE): 0.1 10*3/uL (ref 0.0–0.4)
Eos: 2 %
Hematocrit: 39.4 % (ref 34.0–46.6)
Hemoglobin: 13.7 g/dL (ref 11.1–15.9)
Immature Grans (Abs): 0 10*3/uL (ref 0.0–0.1)
Immature Granulocytes: 0 %
Lymphocytes Absolute: 1.6 10*3/uL (ref 0.7–3.1)
Lymphs: 25 %
MCH: 31.6 pg (ref 26.6–33.0)
MCHC: 34.8 g/dL (ref 31.5–35.7)
MCV: 91 fL (ref 79–97)
Monocytes Absolute: 0.5 10*3/uL (ref 0.1–0.9)
Monocytes: 7 %
Neutrophils Absolute: 4.2 10*3/uL (ref 1.4–7.0)
Neutrophils: 65 %
Platelets: 200 10*3/uL (ref 150–450)
RBC: 4.33 x10E6/uL (ref 3.77–5.28)
RDW: 12 % (ref 11.7–15.4)
WBC: 6.5 10*3/uL (ref 3.4–10.8)

## 2020-05-29 LAB — LIPID PANEL WITH LDL/HDL RATIO
Cholesterol, Total: 149 mg/dL (ref 100–199)
HDL: 55 mg/dL (ref >39)
LDL Chol Calc (NIH): 73 mg/dL (ref 0–99)
LDL/HDL Ratio: 1.3 ratio (ref 0.0–3.2)
Triglycerides: 118 mg/dL (ref 0–149)
VLDL Cholesterol Cal: 21 mg/dL (ref 5–40)

## 2020-05-29 LAB — TSH+FREE T4
Free T4: 1.09 ng/dL (ref 0.82–1.77)
TSH: 1.03 u[IU]/mL (ref 0.450–4.500)

## 2020-05-29 LAB — VITAMIN B12: Vitamin B-12: 683 pg/mL (ref 232–1245)

## 2020-05-29 LAB — VITAMIN D 1,25 DIHYDROXY
Vitamin D 1, 25 (OH)2 Total: 46 pg/mL
Vitamin D2 1, 25 (OH)2: <10 pg/mL
Vitamin D3 1, 25 (OH)2: 46 pg/mL

## 2020-06-01 DIAGNOSIS — M7541 Impingement syndrome of right shoulder: Secondary | ICD-10-CM | POA: Diagnosis not present

## 2020-06-06 ENCOUNTER — Other Ambulatory Visit: Payer: Self-pay

## 2020-06-06 ENCOUNTER — Ambulatory Visit
Admission: RE | Admit: 2020-06-06 | Discharge: 2020-06-06 | Disposition: A | Discharge status: Home / Self Care | Payer: BC Managed Care – PPO | Source: Ambulatory Visit | Attending: Hospice and Palliative Medicine | Admitting: Hospice and Palliative Medicine

## 2020-06-06 DIAGNOSIS — Z1231 Encounter for screening mammogram for malignant neoplasm of breast: Secondary | ICD-10-CM | POA: Insufficient documentation

## 2020-06-06 LAB — HM MAMMOGRAPHY: HM Mammogram: NORMAL (ref 0–4)

## 2020-06-08 DIAGNOSIS — M9905 Segmental and somatic dysfunction of pelvic region: Secondary | ICD-10-CM | POA: Diagnosis not present

## 2020-06-11 ENCOUNTER — Ambulatory Visit: Payer: BC Managed Care – PPO | Admitting: Physician Assistant

## 2020-06-13 ENCOUNTER — Telehealth: Payer: Self-pay

## 2020-06-13 NOTE — Telephone Encounter (Signed)
Confirmed and screened for 06-14-20 ov.

## 2020-06-14 ENCOUNTER — Ambulatory Visit: Payer: BC Managed Care – PPO | Admitting: Hospice and Palliative Medicine

## 2020-06-14 ENCOUNTER — Encounter: Payer: Self-pay | Admitting: Hospice and Palliative Medicine

## 2020-06-14 ENCOUNTER — Other Ambulatory Visit: Payer: Self-pay

## 2020-06-14 DIAGNOSIS — I5189 Other ill-defined heart diseases: Secondary | ICD-10-CM | POA: Diagnosis not present

## 2020-06-14 DIAGNOSIS — F411 Generalized anxiety disorder: Secondary | ICD-10-CM

## 2020-06-14 DIAGNOSIS — M7541 Impingement syndrome of right shoulder: Secondary | ICD-10-CM | POA: Diagnosis not present

## 2020-06-14 MED ORDER — CYCLOBENZAPRINE HCL 5 MG PO TABS: 5.0000 mg | ORAL_TABLET | Freq: Three times a day (TID) | ORAL | 0 refills | Status: DC | PRN

## 2020-06-14 NOTE — Progress Notes (Signed)
[facility] [place], [place] [address]  Internal MEDICINE  Office Visit Note  Patient Name: Laura Barber  [id number]  [id number]  Date of Service: 06/18/2020  Chief Complaint  Patient presents with  . Follow-up  . Hyperlipidemia  . Hypertension  . Anxiety  . policy update form    received    HPI Patient is here for routine follow-up Reviewed her recent echo--diastolic dysfunction, discussed possible causes such as HTN, minimal exercise as well as OSA Mammogram 10/13--normal  Seen at [facility] for shoulder pain-diagnosed with shoulder impingement, was prescribed Robaxin to help manage pain, she says she has taken the Robaxin and it caused insomnia and jitteriness\ She would like to be off of Robaxin and put on Flexeril as she has taken this in the past with no negative side effects  Besides the acute shoulder pain she feels well, anxiety continues to be well controlled, sleeping well, following a healthy eating plan and attempts to get in daily exercise   Current Medication: Outpatient Encounter Medications as of 06/14/2020  Medication Sig Note  . ALPRAZolam (XANAX) 0.5 MG tablet Take 1 tablet (0.5 mg total) by mouth at bedtime as needed for anxiety.   Marland Kitchen atorvastatin (LIPITOR) 10 MG tablet TAKE ONE (1) TABLET BY MOUTH ONCE DAILY AT 6 IN THE EVENING   . desonide (DESOWEN) 0.05 % cream APPLY A SMALL AMOUNT TO SKIN TWICE DAILYAS NEEDED   . estradiol (VIVELLE-DOT) 0.0375 MG/24HR APPLY 1 PATCH EXTERNALLY TO THE SKIN 2 TIMES A WEEK   . fluticasone (FLONASE) 50 MCG/ACT nasal spray Place 2 sprays into both nostrils daily.   . metoprolol tartrate (LOPRESSOR) 25 MG tablet TAKE 1/2 TABLET BY MOUTH TWICE DAILY   . Multiple Vitamin (MULTIVITAMIN) tablet Take 1 tablet by mouth daily.   . naproxen sodium (ANAPROX) 550 MG tablet Take 1 tablet (550 mg total) by mouth 2 (two) times daily with a meal.   . omeprazole (PRILOSEC) 20 MG capsule Take 1 capsule (20  mg total) by mouth daily.   . rizatriptan (MAXALT) 5 MG tablet Take 1 tablet (5 mg total) by mouth as needed for migraine. 02/07/2020: Rarely  . valACYclovir (VALTREX) 1000 MG tablet Take 1 tablet (1,000 mg total) by mouth as needed.   . valsartan-hydrochlorothiazide (DIOVAN-HCT) 80-12.5 MG tablet TAKE (1) TABLET BY MOUTH ONCE DAILY.   Marland Kitchen venlafaxine XR (EFFEXOR-XR) 37.5 MG 24 hr capsule Take 1 capsule daily with breakfast.   . cyclobenzaprine (FLEXERIL) 5 MG tablet Take 1 tablet (5 mg total) by mouth 3 (three) times daily as needed for muscle spasms.   . [DISCONTINUED] cetirizine (ZYRTEC) 10 MG tablet Take 10 mg by mouth daily.    No facility-administered encounter medications on file as of 06/14/2020.    Surgical History: Past Surgical History:  Procedure Laterality Date  . ABDOMINAL HYSTERECTOMY  2005   Radical hysterectomy by Dr Clarene Essex at [facility] cervical cancer 1B1  . BREAST CYST ASPIRATION Left 05/30/2013  . CERVICAL DISC ARTHROPLASTY Right 06/11/2016   Procedure: CERVICAL ANTERIOR [place] ARTHROPLASTY CERVICAL 5-7;  Surgeon: Blanche East, MD;  Location: [facility] ORS;  Service: Neurosurgery;  Laterality: Right;  . DIAGNOSTIC LAPAROSCOPY  2005  . DILATION AND CURETTAGE OF UTERUS     SAB  . 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   Marland Kitchen NASAL SINUS SURGERY      Medical History: Past Medical History:  Diagnosis Date  . Anxiety   . Cancer (  [place])    cervical  . Cervical cancer, FIGO stage IB1 ([place]) 2005   Cervical  . GERD (gastroesophageal reflux disease)   . Headache   . Hyperlipidemia   . Hypertension   . Migraine   . Shingles 2019    Family History: Family History  Problem Relation Age of Onset  . Lung cancer Mother   . Hyperlipidemia Mother   . Diabetes Father   . Hypertension Father   . Heart failure Father   . Heart attack Father   . Heart disease Father   . Hyperlipidemia Brother   . Breast cancer Neg Hx     Social History    Socioeconomic History  . Marital status: Divorced    Spouse name: Not on file  . Number of children: 0  . Years of education: Not on file  . Highest education level: Not on file  Occupational History  . Not on file  Tobacco Use  . Smoking status: Never Smoker  . Smokeless tobacco: Never Used  Vaping Use  . Vaping Use: Never used  Substance and Sexual Activity  . Alcohol use: Yes    Comment: occassional  . Drug use: No  . Sexual activity: Yes    Partners: Male    Birth control/protection: Surgical  Other Topics Concern  . Not on file  Social History Narrative  . Not on file   Social Determinants of Health   Financial Resource Strain:   . Difficulty of Paying Living Expenses: Not on file  Food Insecurity:   . Worried About Charity fundraiser in the Last Year: Not on file  . Ran Out of Food in the Last Year: Not on file  Transportation Needs:   . Lack of Transportation (Medical): Not on file  . Lack of Transportation (Non-Medical): Not on file  Physical Activity:   . Days of Exercise per Week: Not on file  . Minutes of Exercise per Session: Not on file  Stress:   . Feeling of Stress : Not on file  Social Connections:   . Frequency of Communication with Friends and Family: Not on file  . Frequency of Social Gatherings with Friends and Family: Not on file  . Attends Religious Services: Not on file  . Active Member of Clubs or Organizations: Not on file  . Attends Archivist Meetings: Not on file  . Marital Status: Not on file  Intimate Partner Violence:   . Fear of Current or Ex-Partner: Not on file  . Emotionally Abused: Not on file  . Physically Abused: Not on file  . Sexually Abused: Not on file      Review of Systems  Constitutional: Negative for chills, diaphoresis and fatigue.  HENT: Negative for ear pain, postnasal drip and sinus pressure.   Eyes: Negative for photophobia, discharge, redness, itching and visual disturbance.  Respiratory:  Negative for cough, shortness of breath and wheezing.   Cardiovascular: Negative for chest pain, palpitations and leg swelling.  Gastrointestinal: Negative for abdominal pain, constipation, diarrhea, nausea and vomiting.  Genitourinary: Negative for dysuria and flank pain.  Musculoskeletal: Negative for arthralgias, back pain, gait problem and neck pain.       Right shoulder pain  Skin: Negative for color change.  Allergic/Immunologic: Negative for environmental allergies and food allergies.  Neurological: Negative for dizziness and headaches.  Hematological: Does not bruise/bleed easily.  Psychiatric/Behavioral: Negative for agitation, behavioral problems (depression) and hallucinations.    Vital Signs: BP 106/76  Pulse 67   Temp (!) 97.4 F (36.3 C)   Resp 16   Ht 5\' 11"  (1.803 m)   Wt 167 lb 6.4 oz (75.9 kg)   SpO2 99%   BMI 23.35 kg/m    Physical Exam Vitals reviewed.  Constitutional:      Appearance: Normal appearance.  Cardiovascular:     Rate and Rhythm: Normal rate and regular rhythm.     Pulses: Normal pulses.     Heart sounds: Normal heart sounds.  Pulmonary:     Effort: Pulmonary effort is normal.     Breath sounds: Normal breath sounds.  Musculoskeletal:        General: Normal range of motion.     Cervical back: Normal range of motion.  Skin:    General: Skin is warm.  Neurological:     General: No focal deficit present.     Mental Status: She is alert and oriented to person, place, and time. Mental status is at baseline.  Psychiatric:        Mood and Affect: Mood normal.        Behavior: Behavior normal.        Thought Content: Thought content normal.    Assessment/Plan: 1. Impingement syndrome of right shoulder Stop Robaxin due to negative side effects, start Flexeril as needed for acute shoulder pain Continue to follow-up with ortho as needed for further management - cyclobenzaprine (FLEXERIL) 5 MG tablet; Take 1 tablet (5 mg total) by mouth 3  (three) times daily as needed for muscle spasms.  Dispense: 30 tablet; Refill: 0  2. Diastolic dysfunction No further indications of OSA Has long standing history of HTN-currently well controlled History of obesity--has been working on her weight and continues to watch her diet and incorporates exercise into her daily routine No further testing at this time for diastolic dysfunction--will proceed with routine monitoring of symptoms  3. Generalized anxiety disorder Well controlled at this time on current therapy, will continue with routine monitoring  General Counseling: Nona verbalizes understanding of the findings of todays visit and agrees with plan of treatment. I have discussed any further diagnostic evaluation that may be needed or ordered today. We also reviewed her medications today. she has been encouraged to call the office with any questions or concerns that should arise related to todays visit.   Meds ordered this encounter  Medications  . cyclobenzaprine (FLEXERIL) 5 MG tablet    Sig: Take 1 tablet (5 mg total) by mouth 3 (three) times daily as needed for muscle spasms.    Dispense:  30 tablet    Refill:  0    Time spent: 30 Minutes Time spent includes review of chart, medications, test results and follow-up plan with the patient.  This patient was seen by Theodoro Grist AGNP-C in Collaboration with Dr Lavera Guise as a part of collaborative care agreement     Tanna Furry. Anjeanette Petzold AGNP-C Internal medicine

## 2020-06-16 NOTE — Progress Notes (Signed)
Cardiology Office Note    Date:  06/26/2020   ID:  Laura Barber, DOB 1966-06-14, MRN [id number]  PCP:  Lavera Guise, MD  Cardiologist:  Kathlyn Sacramento, MD  Electrophysiologist:  None   Chief Complaint: Follow up  History of Present Illness:   Laura Barber is a 54 y.o. female with history of palpitations/PVCs, hypertension, hyperlipidemia, anxiety, and GERD who presents for follow-up ofpalpitations.  She was evaluated by Dr. Fletcher Anon on 04/09/2018 for palpitations and PVCs. She reported onset of palpitations over the prior few months that were described as a fluttering sensation mostly at rest and most noticeable at night when she was trying to sleep. There was associated mild shortness of breath. Prior 48-hour Holter monitor showed 2400 PVCs in 48 hours. Upon cardiology review of the monitor there was noted to be quite a bit of motion artifact which may have interfered with the count. It was felt her PVC burden was much less than the reported 2400. She reported a family history remarkable for CAD, though not prematurely. There was no family history of sudden death. Echo in 04/21/18 showed an EF of 60 to 65%, no regional wall motion abnormalities, normal LV diastolic function, normal RV cavity size and systolic function. No significant valvular abnormalities.She was seen in the office on 09/10/2018 noting occasional dizziness/lightheadedness/ear fullness/flushing/sweating and associated fluttering that had gotten worse over the prior week. It was noted she continued to be able to exercise without issues.In this setting, she underwent 2 separate 14-day Ziomonitors with each showing normal sinus rhythm with isolated PVCs with the first monitor showing an overall burden of 2.4% which was felt to be overestimated secondary to artifact. The second Ziomonitor showed sinus rhythm with rare PACs and PVCs with an overall burden of less than 1%with rare ventricular  couplets/triplets/ventricular bigeminy/trigeminy.  She was seen in 09/2018 noting an improvement in her tachypalpitations and flushing with the initiation of Lopressor.  Given ventricular ectopy noted on outpatient cardiac monitoring she underwent ETT in 10/2018 which showed no evidence of ischemia and average exercise capacity with an exercise duration of 7 minutes and 38 seconds achieving a workload of 9.5 METs. She was last seen in the office in 02/2020 and was doing well from a cardiac perspective. She did continue to note intermittent palpitations that occurred randomly with need to take a deep inhalation. Updated labs were unrevealing. BP was well controlled. In the setting of PVCs, PCP ordered echo which was completed on 05/16/2020 and demonstrated, as read by outside office, normal LV systolic function with an EF of 60 to 51%, diastolic dysfunction, mild mitral vegetation, and trace tricuspid regurgitation.  She comes in doing very well from a cardiac perspective.  Since she has last been seen she has noted resolution of palpitations.  No chest pain, dyspnea, presyncope, or syncope.  No lower extremity swelling, abdominal distention, orthopnea.  She does note some mild dizziness if she changes positions quickly.  She continues to work on a heart healthy diet and is getting back into her exercise regimen though is somewhat limited secondary to inflammation along the right shoulder.  She has done well throughout the Covid pandemic.  She does not have any issues or concerns at this time.   Labs independently reviewed: 04/2020 - TSH normal, TC 149, TG 118, HDL 55, LDL 73, BUN 19, serum creatinine 0.92, potassium 4.3, albumin 4.4, AST/ALT normal, Hgb 13.7, PLT 200 02/2020 - magnesium 2.0   Past Medical History:  Diagnosis  Date  . Anxiety   . Cancer ([place])    cervical  . Cervical cancer, FIGO stage IB1 ([place]) 2005   Cervical  . GERD (gastroesophageal reflux disease)   . Headache   . Hyperlipidemia     . Hypertension   . Migraine   . Shingles 2019    Past Surgical History:  Procedure Laterality Date  . ABDOMINAL HYSTERECTOMY  2005   Radical hysterectomy by Dr Clarene Essex at [facility] cervical cancer 1B1  . BREAST CYST ASPIRATION Left 05/30/2013  . CERVICAL DISC ARTHROPLASTY Right 06/11/2016   Procedure: CERVICAL ANTERIOR [place] ARTHROPLASTY CERVICAL 5-7;  Surgeon: Blanche East, MD;  Location: [facility] ORS;  Service: Neurosurgery;  Laterality: Right;  . DIAGNOSTIC LAPAROSCOPY  2005  . DILATION AND CURETTAGE OF UTERUS     SAB  . 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   Marland Kitchen NASAL SINUS SURGERY      Current Medications: Current Meds  Medication Sig  . ALPRAZolam (XANAX) 0.5 MG tablet Take 1 tablet (0.5 mg total) by mouth at bedtime as needed for anxiety.  Marland Kitchen atorvastatin (LIPITOR) 10 MG tablet TAKE ONE (1) TABLET BY MOUTH ONCE DAILY AT 6 IN THE EVENING  . cyclobenzaprine (FLEXERIL) 5 MG tablet Take 1 tablet (5 mg total) by mouth 3 (three) times daily as needed for muscle spasms.  Marland Kitchen desonide (DESOWEN) 0.05 % cream APPLY A SMALL AMOUNT TO SKIN TWICE DAILYAS NEEDED  . estradiol (VIVELLE-DOT) 0.0375 MG/24HR APPLY 1 PATCH EXTERNALLY TO THE SKIN 2 TIMES A WEEK  . fluticasone (FLONASE) 50 MCG/ACT nasal spray Place 2 sprays into both nostrils daily.  . metoprolol tartrate (LOPRESSOR) 25 MG tablet TAKE 1/2 TABLET BY MOUTH TWICE DAILY  . Multiple Vitamin (MULTIVITAMIN) tablet Take 1 tablet by mouth daily.  . naproxen sodium (ANAPROX) 550 MG tablet Take 1 tablet (550 mg total) by mouth 2 (two) times daily with a meal.  . omeprazole (PRILOSEC) 20 MG capsule Take 1 capsule (20 mg total) by mouth daily.  . rizatriptan (MAXALT) 5 MG tablet Take 1 tablet (5 mg total) by mouth as needed for migraine.  . valACYclovir (VALTREX) 1000 MG tablet Take 1 tablet (1,000 mg total) by mouth as needed.  . valsartan-hydrochlorothiazide (DIOVAN-HCT) 80-12.5 MG tablet TAKE (1) TABLET BY  MOUTH EVERY DAY  . venlafaxine XR (EFFEXOR-XR) 37.5 MG 24 hr capsule TAKE (1) CAPSULE BY MOUTH EVERY DAY WITHBREAKFAST    Allergies:   Claritin-d 12 hour [loratadine-pseudoephedrine er] and Sulfa antibiotics   Social History   Socioeconomic History  . Marital status: Divorced    Spouse name: Not on file  . Number of children: 0  . Years of education: Not on file  . Highest education level: Not on file  Occupational History  . Not on file  Tobacco Use  . Smoking status: Never Smoker  . Smokeless tobacco: Never Used  Vaping Use  . Vaping Use: Never used  Substance and Sexual Activity  . Alcohol use: Yes    Comment: occassional  . Drug use: No  . Sexual activity: Yes    Partners: Male    Birth control/protection: Surgical  Other Topics Concern  . Not on file  Social History Narrative  . Not on file   Social Determinants of Health   Financial Resource Strain:   . Difficulty of Paying Living Expenses: Not on file  Food Insecurity:   . Worried About Charity fundraiser in the Last Year: Not on  file  . [place] in the Last Year: Not on file  Transportation Needs:   . Lack of Transportation (Medical): Not on file  . Lack of Transportation (Non-Medical): Not on file  Physical Activity:   . Days of Exercise per Week: Not on file  . Minutes of Exercise per Session: Not on file  Stress:   . Feeling of Stress : Not on file  Social Connections:   . Frequency of Communication with Friends and Family: Not on file  . Frequency of Social Gatherings with Friends and Family: Not on file  . Attends Religious Services: Not on file  . Active Member of Clubs or Organizations: Not on file  . Attends Archivist Meetings: Not on file  . Marital Status: Not on file     Family History:  The patient's family history includes Diabetes in her father; Heart attack in her father; Heart disease in her father; Heart failure in her father; Hyperlipidemia in her brother and  mother; Hypertension in her father; Lung cancer in her mother. There is no history of Breast cancer.  ROS:   Review of Systems  Constitutional: Negative for chills, diaphoresis, fever, malaise/fatigue and weight loss.  HENT: Negative for congestion.   Eyes: Negative for discharge and redness.  Respiratory: Negative for cough, sputum production, shortness of breath and wheezing.   Cardiovascular: Negative for chest pain, palpitations, orthopnea, claudication, leg swelling and PND.  Gastrointestinal: Negative for abdominal pain, heartburn, nausea and vomiting.  Musculoskeletal: Positive for joint pain. Negative for falls and myalgias.       Right shoulder pain  Skin: Negative for rash.  Neurological: Positive for dizziness. Negative for tingling, tremors, sensory change, speech change, focal weakness, loss of consciousness and weakness.  Endo/Heme/Allergies: Does not bruise/bleed easily.  Psychiatric/Behavioral: Negative for substance abuse. The patient is not nervous/anxious.   All other systems reviewed and are negative.    EKGs/Labs/Other Studies Reviewed:    Studies reviewed were summarized above. The additional studies were reviewed today: As above.   EKG:  EKG is not ordered today.    Recent Labs: 03/02/2020: Magnesium 2.0 05/18/2020: ALT 30; BUN 19; Creatinine, Ser 0.92; Hemoglobin 13.7; Platelets 200; Potassium 4.3; Sodium 138; TSH 1.030  Recent Lipid Panel    Component Value Date/Time   CHOL 149 05/18/2020 0828   TRIG 118 05/18/2020 0828   HDL 55 05/18/2020 0828   LDLCALC 73 05/18/2020 0828    PHYSICAL EXAM:    VS:  BP 126/78   Pulse 68   Ht 5\' 11"  (1.803 m)   Wt 165 lb (74.8 kg)   BMI 23.01 kg/m   BMI: Body mass index is 23.01 kg/m.  Physical Exam Constitutional:      Appearance: She is well-developed.  HENT:     Head: Normocephalic and atraumatic.  Eyes:     General:        Right eye: No discharge.        Left eye: No discharge.  Neck:     Vascular: No  JVD.  Cardiovascular:     Rate and Rhythm: Normal rate and regular rhythm.     Pulses: No midsystolic click and no opening snap.          Posterior tibial pulses are 2+ on the right side and 2+ on the left side.     Heart sounds: Normal heart sounds, S1 normal and S2 normal. Heart sounds not distant. No murmur heard.  No friction  rub.  Pulmonary:     Effort: Pulmonary effort is normal. No respiratory distress.     Breath sounds: Normal breath sounds. No decreased breath sounds, wheezing or rales.  Chest:     Chest wall: No tenderness.  Abdominal:     General: There is no distension.     Palpations: Abdomen is soft.     Tenderness: There is no abdominal tenderness.  Musculoskeletal:     Cervical back: Normal range of motion.  Skin:    General: Skin is warm and dry.     Nails: There is no clubbing.  Neurological:     Mental Status: She is alert and oriented to person, place, and time.  Psychiatric:        Speech: Speech normal.        Behavior: Behavior normal.        Thought Content: Thought content normal.        Judgment: Judgment normal.     Wt Readings from Last 3 Encounters:  06/26/20 165 lb (74.8 kg)  06/14/20 167 lb 6.4 oz (75.9 kg)  05/03/20 165 lb 12.8 oz (75.2 kg)     ASSESSMENT & PLAN:   1. Palpitations/PVCs: Quiescent.  Echoes have demonstrated no structural cardiac abnormalities with preserved LVSF.  Stress test without evidence of ischemia at peak stress or in recovery.  Electrolytes at goal.  No further testing indicated at this time.  Continue Lopressor 12.5 mg twice daily.  2. HTN: Blood pressure is well controlled in the office today.  Continue metoprolol and Diovan HCT.  Recommend slow positional changes.  3. Diastolic dysfunction: Noted on echo at outside office.  Not requiring standing diuretic.  Recommend optimal BP and weight control.  4. HLD: LDL of 73 from 04/2020.  Remains on atorvastatin.  Followed by PCP.  Disposition: F/u with Dr. Fletcher Anon or an  APP in 12 months, sooner if needed.   Medication Adjustments/Labs and Tests Ordered: Current medicines are reviewed at length with the patient today.  Concerns regarding medicines are outlined above. Medication changes, Labs and Tests ordered today are summarized above and listed in the Patient Instructions accessible in Encounters.   Signed, Christell Faith, PA-C 06/26/2020 3:25 PM     [facility] [address] [place] [address] [place], [place] [address] [phone]

## 2020-06-18 ENCOUNTER — Encounter: Payer: Self-pay | Admitting: Hospice and Palliative Medicine

## 2020-06-19 ENCOUNTER — Other Ambulatory Visit: Payer: Self-pay | Admitting: Hospice and Palliative Medicine

## 2020-06-19 DIAGNOSIS — F411 Generalized anxiety disorder: Secondary | ICD-10-CM

## 2020-06-25 ENCOUNTER — Other Ambulatory Visit: Payer: Self-pay | Admitting: Hospice and Palliative Medicine

## 2020-06-25 DIAGNOSIS — I10 Essential (primary) hypertension: Secondary | ICD-10-CM

## 2020-06-26 ENCOUNTER — Encounter: Payer: Self-pay | Admitting: Physician Assistant

## 2020-06-26 ENCOUNTER — Ambulatory Visit: Payer: BC Managed Care – PPO | Admitting: Physician Assistant

## 2020-06-26 ENCOUNTER — Other Ambulatory Visit: Payer: Self-pay

## 2020-06-26 VITALS — BP 126/78 | HR 68 | Ht 71.0 in | Wt 165.0 lb

## 2020-06-26 DIAGNOSIS — R42 Dizziness and giddiness: Secondary | ICD-10-CM | POA: Diagnosis not present

## 2020-06-26 DIAGNOSIS — R002 Palpitations: Secondary | ICD-10-CM

## 2020-06-26 DIAGNOSIS — I10 Essential (primary) hypertension: Secondary | ICD-10-CM

## 2020-06-26 DIAGNOSIS — I493 Ventricular premature depolarization: Secondary | ICD-10-CM

## 2020-06-26 DIAGNOSIS — E782 Mixed hyperlipidemia: Secondary | ICD-10-CM

## 2020-06-26 DIAGNOSIS — R55 Syncope and collapse: Secondary | ICD-10-CM

## 2020-06-26 NOTE — Patient Instructions (Signed)
Medication Instructions:   1. Your physician recommends that you continue on your current medications as directed. Please refer to the Current Medication list given to you today.  *If you need a refill on your cardiac medications before your next appointment, please call your pharmacy*   Lab Work:  1. None Ordered  If you have labs (blood work) drawn today and your tests are completely normal, you will receive your results only by: Marland Kitchen MyChart Message (if you have MyChart) OR . A paper copy in the mail If you have any lab test that is abnormal or we need to change your treatment, we will call you to review the results.   Testing/Procedures:  1. None Ordered   Follow-Up: At [facility], you and your health needs are our priority.  As part of our continuing mission to provide you with exceptional heart care, we have created designated Provider Care Teams.  These Care Teams include your primary Cardiologist (physician) and Advanced Practice Providers (APPs -  Physician Assistants and Nurse Practitioners) who all work together to provide you with the care you need, when you need it.  We recommend signing up for the patient portal called "MyChart".  Sign up information is provided on this After Visit Summary.  MyChart is used to connect with patients for Virtual Visits (Telemedicine).  Patients are able to view lab/test results, encounter notes, upcoming appointments, etc.  Non-urgent messages can be sent to your provider as well.   To learn more about what you can do with MyChart, go to [email or web address].    Your next appointment:   12 month(s)  The format for your next appointment:   In Person  Provider:   You may see Kathlyn Sacramento, MD or one of the following Advanced Practice Providers on your designated Care Team:     Christell Faith, [place]

## 2020-07-05 DIAGNOSIS — M9905 Segmental and somatic dysfunction of pelvic region: Secondary | ICD-10-CM | POA: Diagnosis not present

## 2020-07-20 ENCOUNTER — Other Ambulatory Visit: Payer: Self-pay | Admitting: Physician Assistant

## 2020-07-20 ENCOUNTER — Other Ambulatory Visit: Payer: Self-pay | Admitting: Hospice and Palliative Medicine

## 2020-07-20 DIAGNOSIS — F411 Generalized anxiety disorder: Secondary | ICD-10-CM

## 2020-08-01 ENCOUNTER — Other Ambulatory Visit: Payer: Self-pay | Admitting: Hospice and Palliative Medicine

## 2020-08-01 DIAGNOSIS — I10 Essential (primary) hypertension: Secondary | ICD-10-CM

## 2020-08-03 DIAGNOSIS — M9905 Segmental and somatic dysfunction of pelvic region: Secondary | ICD-10-CM | POA: Diagnosis not present

## 2020-08-21 ENCOUNTER — Other Ambulatory Visit: Payer: Self-pay | Admitting: Hospice and Palliative Medicine

## 2020-08-21 DIAGNOSIS — F411 Generalized anxiety disorder: Secondary | ICD-10-CM

## 2020-08-30 ENCOUNTER — Other Ambulatory Visit: Payer: Self-pay | Admitting: Hospice and Palliative Medicine

## 2020-08-30 ENCOUNTER — Encounter: Payer: Self-pay | Admitting: Hospice and Palliative Medicine

## 2020-08-30 MED ORDER — AZITHROMYCIN 250 MG PO TABS: ORAL_TABLET | ORAL | 0 refills | Status: DC

## 2020-08-30 NOTE — Progress Notes (Signed)
ZPAK sent to pharmacy for prolonged symptoms of acute sinusitis.

## 2020-09-17 ENCOUNTER — Other Ambulatory Visit: Payer: Self-pay | Admitting: Hospice and Palliative Medicine

## 2020-09-17 DIAGNOSIS — F411 Generalized anxiety disorder: Secondary | ICD-10-CM

## 2020-09-24 ENCOUNTER — Encounter: Payer: Self-pay | Admitting: Hospice and Palliative Medicine

## 2020-09-28 ENCOUNTER — Ambulatory Visit
Admission: EM | Admit: 2020-09-28 | Discharge: 2020-09-28 | Disposition: A | Discharge status: Home / Self Care | Payer: BC Managed Care – PPO | Attending: Sports Medicine | Admitting: Sports Medicine

## 2020-09-28 ENCOUNTER — Ambulatory Visit (INDEPENDENT_AMBULATORY_CARE_PROVIDER_SITE_OTHER): Payer: BC Managed Care – PPO

## 2020-09-28 ENCOUNTER — Other Ambulatory Visit: Payer: Self-pay

## 2020-09-28 ENCOUNTER — Encounter: Payer: Self-pay | Admitting: Emergency Medicine

## 2020-09-28 DIAGNOSIS — R002 Palpitations: Secondary | ICD-10-CM

## 2020-09-28 DIAGNOSIS — M9905 Segmental and somatic dysfunction of pelvic region: Secondary | ICD-10-CM | POA: Diagnosis not present

## 2020-09-28 DIAGNOSIS — R0789 Other chest pain: Secondary | ICD-10-CM | POA: Diagnosis not present

## 2020-09-28 DIAGNOSIS — R079 Chest pain, unspecified: Secondary | ICD-10-CM | POA: Diagnosis not present

## 2020-09-28 LAB — COMPREHENSIVE METABOLIC PANEL
ALT: 32 U/L (ref 0–44)
AST: 26 U/L (ref 15–41)
Albumin: 4.5 g/dL (ref 3.5–5.0)
Alkaline Phosphatase: 42 U/L (ref 38–126)
Anion gap: 9 (ref 5–15)
BUN: 25 mg/dL — ABNORMAL HIGH (ref 6–20)
CO2: 31 mmol/L (ref 22–32)
Calcium: 9.2 mg/dL (ref 8.9–10.3)
Chloride: 98 mmol/L (ref 98–111)
Creatinine, Ser: 0.84 mg/dL (ref 0.44–1.00)
GFR, Estimated: >60 mL/min (ref >60)
Glucose, Bld: 80 mg/dL (ref 70–99)
Potassium: 4.3 mmol/L (ref 3.5–5.1)
Sodium: 138 mmol/L (ref 135–145)
Total Bilirubin: 0.7 mg/dL (ref 0.3–1.2)
Total Protein: 7.3 g/dL (ref 6.5–8.1)

## 2020-09-28 LAB — CBC WITH DIFFERENTIAL/PLATELET
Abs Immature Granulocytes: 0.02 10*3/uL (ref 0.00–0.07)
Basophils Absolute: 0.1 10*3/uL (ref 0.0–0.1)
Basophils Relative: 1 %
Eosinophils Absolute: 0.2 10*3/uL (ref 0.0–0.5)
Eosinophils Relative: 2 %
HCT: 41.2 % (ref 36.0–46.0)
Hemoglobin: 14 g/dL (ref 12.0–15.0)
Immature Granulocytes: 0 %
Lymphocytes Relative: 27 %
Lymphs Abs: 2.5 10*3/uL (ref 0.7–4.0)
MCH: 30.9 pg (ref 26.0–34.0)
MCHC: 34 g/dL (ref 30.0–36.0)
MCV: 90.9 fL (ref 80.0–100.0)
Monocytes Absolute: 0.7 10*3/uL (ref 0.1–1.0)
Monocytes Relative: 8 %
Neutro Abs: 5.7 10*3/uL (ref 1.7–7.7)
Neutrophils Relative %: 62 %
Platelets: 233 10*3/uL (ref 150–400)
RBC: 4.53 MIL/uL (ref 3.87–5.11)
RDW: 11.8 % (ref 11.5–15.5)
WBC: 9.3 10*3/uL (ref 4.0–10.5)
nRBC: 0 % (ref 0.0–0.2)

## 2020-09-28 LAB — TROPONIN I (HIGH SENSITIVITY): Troponin I (High Sensitivity): 2 ng/L (ref <18)

## 2020-09-28 MED ORDER — NAPROXEN 250 MG PO TABS: 500.0000 mg | ORAL_TABLET | Freq: Two times a day (BID) | ORAL | 0 refills | Status: AC

## 2020-09-28 NOTE — ED Triage Notes (Signed)
Patient c/o chest pressure and pain that started around 6:30 am this morning.  Patient Patient denies SOB.  Patient denies cold symptoms.  Patient denies N/V.

## 2020-09-28 NOTE — Discharge Instructions (Signed)
Your EKG was normal.  Your chest x-ray was normal.  All of your lab work looks really good.  Your cardiac enzyme, troponin, is normal.  This is elevated in cases of heart attacks.  Since your pain is reproducible with palpation of your chest, I am confident that the chest pain is due to musculoskeletal cause.  Take the naproxen as prescribed for pain and inflammation.  Can also consider using ice or heat to see if that will help.  It is fine to take Tylenol 1 to 2 tablets 3 times daily as needed for pain.  Go to emergency department if the chest pain is associated with any red flags below.  CHEST PAIN RED FLAGS:  The main red flags to look out for that could mean a heart attack are; pain in the center of the chest, difficulty breathing, nausea, sweating, clammy skin, dizziness, pain radiating to the jaw/neck/arm, racing heart, etc. If any of those symptoms are present, you must immediately get to the ER.

## 2020-09-28 NOTE — ED Provider Notes (Signed)
[facility]    CSN: [id number] Arrival date & time: 09/28/20  1646      History   Chief Complaint Chief Complaint  Patient presents with  . Chest Pain    HPI Laura Barber is a 55 y.o. female presenting for "chest pressure" that has been constant over the past 11 hours.  Patient states that the pressure is in the center of her chest.  She says is not really that painful.  Denies any radiation of the pressure to her neck, jaw, back or extremities.  No association to food.  She denies any dizziness, weakness, shortness of breath.  Denies any fevers or recent illness.  No cough or congestion.  No abdominal pain, nausea or vomiting.  Denies headaches.  Patient states that the pressure has not gotten any better or worse.  She states that she went to see her chiropractor for her monthly back adjustments and mention the chest pressure.  She says that her chiropractor advised her to seek evaluation in urgent care.  Patient does have some tenderness whenever she presses on her chest.  Denies any injury to the chest and cannot think of any strenuous exercise or anything that could have caused musculoskeletal pain.  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  Not feeling any PVCs currently, but states she did earlier.  Patient does follow-up with cardiology for her PVCs.  She is generally well controlled with metoprolol.  Her last visit with her cardiologist was in November 2021.  She has had unremarkable ECHOs. Review of cardiology note from November states "ETT in 10/2018 which showed no evidence of ischemia and average exercise capacity with an exercise duration of 7 minutes and 38 seconds achieving a workload of 9.5 METs."  Other past medical history is significant for GERD, headaches, well-controlled hypertension and well-controlled hyperlipidemia, anxiety and palpitations.  Patient has no other complaints or concerns today.  HPI  Past  Medical History:  Diagnosis Date  . Anxiety   . Cancer ([place])    cervical  . Cervical cancer, FIGO stage IB1 ([place]) 2005   Cervical  . GERD (gastroesophageal reflux disease)   . Headache   . Hyperlipidemia   . Hypertension   . Migraine   . Shingles 2019    Patient Active Problem List   Diagnosis Date Noted  . Anxiety 01/25/2019  . Palpitations 01/25/2019  . Hypertension   . Hyperlipidemia   . GERD (gastroesophageal reflux disease)   . Cervical cancer, FIGO stage IB1 ([place]) 01/14/2017  . Cervical stenosis of spine 06/11/2016  . Lump or mass in breast 05/30/2013    Past Surgical History:  Procedure Laterality Date  . ABDOMINAL HYSTERECTOMY  2005   Radical hysterectomy by Dr Clarene Essex at [facility] cervical cancer 1B1  . BREAST CYST ASPIRATION Left 05/30/2013  . CERVICAL DISC ARTHROPLASTY Right 06/11/2016   Procedure: CERVICAL ANTERIOR [place] ARTHROPLASTY CERVICAL 5-7;  Surgeon: Blanche East, MD;  Location: [facility] ORS;  Service: Neurosurgery;  Laterality: Right;  . DIAGNOSTIC LAPAROSCOPY  2005  . DILATION AND CURETTAGE OF UTERUS     SAB  . 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   Marland Kitchen NASAL SINUS SURGERY      OB History    Gravida  1   Para      Term      Preterm      AB  1   Living  0  SAB  1   IAB      Ectopic      Multiple      Live Births           Obstetric Comments  Menstrual age: 50  Age 1st Pregnancy: N/A         Home Medications    Prior to Admission medications   Medication Sig Start Date End Date Taking? Authorizing Provider  ALPRAZolam Duanne Moron) 0.5 MG tablet Take 1 tablet (0.5 mg total) by mouth at bedtime as needed for anxiety. 11/01/19  Yes Scarboro, Audie Clear, NP  atorvastatin (LIPITOR) 10 MG tablet TAKE ONE (1) TABLET BY MOUTH ONCE DAILY AT 6 IN THE EVENING 05/17/20  Yes Luiz Ochoa, NP  estradiol (VIVELLE-DOT) 0.0375 MG/24HR APPLY 1 PATCH EXTERNALLY TO THE SKIN 2 TIMES A WEEK 04/04/20  Yes  Dalia Heading, CNM  fluticasone (FLONASE) 50 MCG/ACT nasal spray Place 2 sprays into both nostrils daily. 07/15/19  Yes Melynda Ripple, MD  metoprolol tartrate (LOPRESSOR) 25 MG tablet TAKE 1/2 TABLET BY MOUTH TWICE DAILY 07/23/20  Yes Rise Mu, PA-C  Multiple Vitamin (MULTIVITAMIN) tablet Take 1 tablet by mouth daily.   Yes [provider]  naproxen (NAPROSYN) 250 MG tablet Take 2 tablets (500 mg total) by mouth 2 (two) times daily with a meal for 7 days. 09/28/20 10/05/20 Yes Laurene Footman B, PA-C  naproxen sodium (ANAPROX) 550 MG tablet Take 1 tablet (550 mg total) by mouth 2 (two) times daily with a meal. 11/01/19  Yes Scarboro, Audie Clear, NP  omeprazole (PRILOSEC) 20 MG capsule Take 1 capsule (20 mg total) by mouth daily. 03/26/18  Yes Scarboro, Audie Clear, NP  rizatriptan (MAXALT) 5 MG tablet Take 1 tablet (5 mg total) by mouth as needed for migraine. 11/01/19  Yes Scarboro, Audie Clear, NP  valACYclovir (VALTREX) 1000 MG tablet Take 1 tablet (1,000 mg total) by mouth as needed. 11/01/19  Yes Kendell Bane, NP  valsartan-hydrochlorothiazide (DIOVAN-HCT) 80-12.5 MG tablet TAKE (1) TABLET BY MOUTH EVERY DAY 08/01/20  Yes Lavera Guise, MD  venlafaxine XR (EFFEXOR-XR) 37.5 MG 24 hr capsule TAKE (1) CAPSULE BY MOUTH EVERY DAY [facility] 09/17/20  Yes Luiz Ochoa, NP  azithromycin (ZITHROMAX Z-PAK) 250 MG tablet Take two 250 mg tablets on day one followed by one 250 mg tablet each day for four days. 08/30/20   Luiz Ochoa, NP  cyclobenzaprine (FLEXERIL) 5 MG tablet Take 1 tablet (5 mg total) by mouth 3 (three) times daily as needed for muscle spasms. 06/14/20   Luiz Ochoa, NP  desonide (DESOWEN) 0.05 % cream APPLY A SMALL AMOUNT TO SKIN TWICE DAILYAS NEEDED 11/01/19   Kendell Bane, NP  cetirizine (ZYRTEC) 10 MG tablet Take 10 mg by mouth daily.  07/15/19  [provider]    Family History Family History  Problem Relation Age of Onset  . Lung cancer Mother   .  Hyperlipidemia Mother   . Diabetes Father   . Hypertension Father   . Heart failure Father   . Heart attack Father   . Heart disease Father   . Hyperlipidemia Brother   . Breast cancer Neg Hx     Social History Social History   Tobacco Use  . Smoking status: Never Smoker  . Smokeless tobacco: Never Used  Vaping Use  . Vaping Use: Never used  Substance Use Topics  . Alcohol use: Yes    Comment: occassional  . Drug use:  No     Allergies   Claritin-d 12 hour [loratadine-pseudoephedrine er] and Sulfa antibiotics   Review of Systems Review of Systems  Constitutional: Negative for diaphoresis, fatigue and fever.  HENT: Negative for congestion.   Respiratory: Negative for cough, shortness of breath and wheezing.   Cardiovascular: Positive for chest pain and palpitations (intermittent and chronic).  Gastrointestinal: Negative for abdominal pain, diarrhea, nausea and vomiting.  Musculoskeletal: Negative for arthralgias, back pain and neck pain.  Neurological: Negative for dizziness, weakness, light-headedness, numbness and headaches.     Physical Exam Triage Vital Signs ED Triage Vitals  Enc Vitals Group     BP 09/28/20 1655 140/75     Pulse Rate 09/28/20 1655 77     Resp 09/28/20 1655 14     Temp 09/28/20 1655 98 F (36.7 C)     Temp Source 09/28/20 1655 Oral     SpO2 09/28/20 1655 100 %     Weight 09/28/20 1653 155 lb (70.3 kg)     Height 09/28/20 1653 5\' 10"  (1.778 m)     Head Circumference --      Peak Flow --      Pain Score 09/28/20 1653 5     Pain Loc --      Pain Edu? --      Excl. in [place]? --    No data found.  Updated Vital Signs BP 140/75 (BP Location: Right Arm)   Pulse 77   Temp 98 F (36.7 C) (Oral)   Resp 14   Ht 5\' 10"  (1.778 m)   Wt 155 lb (70.3 kg)   SpO2 100%   BMI 22.24 kg/m       Physical Exam Vitals and nursing note reviewed.  Constitutional:      General: She is not in acute distress.    Appearance: Normal appearance. She is  not ill-appearing or toxic-appearing.  HENT:     Head: Normocephalic and atraumatic.     Right Ear: Tympanic membrane, ear canal and external ear normal.     Left Ear: Tympanic membrane, ear canal and external ear normal.     Nose: Nose normal.     Mouth/Throat:     Mouth: Mucous membranes are moist.     Pharynx: Oropharynx is clear.  Eyes:     General: No scleral icterus.       Right eye: No discharge.        Left eye: No discharge.     Conjunctiva/sclera: Conjunctivae normal.  Cardiovascular:     Rate and Rhythm: Normal rate and regular rhythm.     Heart sounds: Normal heart sounds.  Pulmonary:     Effort: Pulmonary effort is normal. No respiratory distress.     Breath sounds: Normal breath sounds. No wheezing or rales.  Chest:     Chest wall: Tenderness (reproducible pain. Tenderness to palpation central chest and along left pectoralis) present.  Abdominal:     Palpations: Abdomen is soft.     Tenderness: There is no abdominal tenderness.  Musculoskeletal:     Cervical back: Neck supple.  Skin:    General: Skin is dry.  Neurological:     General: No focal deficit present.     Mental Status: She is alert. Mental status is at baseline.     Motor: No weakness.     Gait: Gait normal.  Psychiatric:        Mood and Affect: Mood normal.  Behavior: Behavior normal.        Thought Content: Thought content normal.      UC Treatments / Results  Labs (all labs ordered are listed, but only abnormal results are displayed) Labs Reviewed  COMPREHENSIVE METABOLIC PANEL - Abnormal; Notable for the following components:      Result Value   BUN 25 (*)    All other components within normal limits  CBC WITH DIFFERENTIAL/PLATELET  TROPONIN I (HIGH SENSITIVITY)    EKG   Radiology DG Chest 2 View  Result Date: 09/28/2020 CLINICAL DATA:  Midline chest pressure and pain started around 630 this morning. EXAM: CHEST - 2 VIEW COMPARISON:  None. FINDINGS: The heart size and  mediastinal contours are within normal limits. No focal consolidation. No pleural effusion. No pneumothorax. The visualized skeletal structures are unremarkable. Visualized cervical fixation hardware. IMPRESSION: No active cardiopulmonary disease. Electronically Signed   By: Dahlia Bailiff MD   On: 09/28/2020 17:20    Procedures ED EKG  Date/Time: 09/28/2020 5:19 PM Performed by: Danton Clap, PA-C Authorized by: Verda Cumins, MD   ECG reviewed by ED Physician in the absence of a cardiologist: yes   Previous ECG:    Previous ECG:  Unavailable Interpretation:    Interpretation: normal   Rate:    ECG rate:  71   ECG rate assessment: normal   Rhythm:    Rhythm: sinus rhythm   Ectopy:    Ectopy: none   QRS:    QRS axis:  Normal   QRS intervals:  Normal   QRS conduction: normal   ST segments:    ST segments:  Normal T waves:    T waves: normal   Comments:     Sinus rhythm. Regular rate. No ST-T wave changes   (including critical care time)  Medications Ordered in UC Medications - No data to display  Initial Impression / Assessment and Plan / UC Course  I have reviewed the triage vital signs and the nursing notes.  Pertinent labs & imaging results that were available during my care of the patient were reviewed by me and considered in my medical decision making (see chart for details).   55 year old female presenting for approximately 11-hour history of central and left-sided chest pressure without radiation.  Does not have any associated symptoms.  Nothing makes it better or worse.  Palpating the chest reproduces the pain.    History of palpitations due to PVCs and hypertension. Controlled with metoprolol. Patient's cardiologist stated she looked good from cardiology stand point at office visit a couple of months ago.  In the clinic all vital signs are normal and stable patient is in no acute distress.  EKG is within normal limits.  Normal sinus rhythm and rate of 71  bpm.  No ST or T wave changes.  Chest x-ray which was independently reviewed by me is within normal limits.  Labs obtained today include CBC, CMP and troponin.  Labs were all unrevealing.  BUN slightly elevated, otherwise normal labs.  Troponin was 2.  Patient is relatively low risk for ACS and with her findings today I do not suspect such.  Suspect musculoskeletal chest pain since it is reproducible on exam.  Treating with supportive care at this time.  Sent in naproxen.  Also advised she can take Tylenol if needed for pain.  Return and ED precautions reviewed with patient.  Chest pain red flags reviewed in detail with patient.  Advise follow-up with our clinic as  needed.  Follow up with cardiology as scheduled and sooner if needed.   Final Clinical Impressions(s) / UC Diagnoses   Final diagnoses:  Chest wall pain  Palpitations     Discharge Instructions     Your EKG was normal.  Your chest x-ray was normal.  All of your lab work looks really good.  Your cardiac enzyme, troponin, is normal.  This is elevated in cases of heart attacks.  Since your pain is reproducible with palpation of your chest, I am confident that the chest pain is due to musculoskeletal cause.  Take the naproxen as prescribed for pain and inflammation.  Can also consider using ice or heat to see if that will help.  It is fine to take Tylenol 1 to 2 tablets 3 times daily as needed for pain.  Go to emergency department if the chest pain is associated with any red flags below.  CHEST PAIN RED FLAGS:  The main red flags to look out for that could mean a heart attack are; pain in the center of the chest, difficulty breathing, nausea, sweating, clammy skin, dizziness, pain radiating to the jaw/neck/arm, racing heart, etc. If any of those symptoms are present, you must immediately get to the ER.     ED Prescriptions    Medication Sig Dispense Auth. Provider   naproxen (NAPROSYN) 250 MG tablet Take 2 tablets (500 mg total)  by mouth 2 (two) times daily with a meal for 7 days. 28 tablet Danton Clap, PA-C     PDMP not reviewed this encounter.   Danton Clap, PA-C 09/28/20 1831

## 2020-10-01 DIAGNOSIS — R21 Rash and other nonspecific skin eruption: Secondary | ICD-10-CM | POA: Diagnosis not present

## 2020-10-10 NOTE — Progress Notes (Signed)
Cardiology Office Note    Date:  10/12/2020   ID:  Laura Barber, DOB [birth year], MRN [id number]  PCP:  Lavera Guise, MD  Cardiologist:  Kathlyn Sacramento, MD  Electrophysiologist:  None   Chief Complaint: Chest pain  History of Present Illness:   Laura SONNTAG is a 55 y.o. female with history of palpitations/PVCs, hypertension, hyperlipidemia, anxiety, and GERD who presents for evaluation of chest pain.  Shewas evaluated by Dr. Fletcher Anon on 04/09/2018 for palpitations and PVCs. Shereported onset of palpitations over the prior few months that weredescribed as a fluttering sensation mostly at rest and most noticeable at night when she was trying to sleep. There was associated mild shortness of breath. Prior 48-hour Holter monitor showed 2400 PVCs in 48 hours. Upon cardiology review of the monitor there was noted to be quite a bit of motion artifact which may have interfered with the count. It was felt her PVC burden was much less than the reported 2400. She reported a family history remarkable for CAD, though not prematurely. There was no family history of sudden death. Echo in 05-11-18 showed an EF of 60 to 65%, no regional wall motion abnormalities, normal LV diastolic function, normal RV cavity size and systolic function. No significant valvular abnormalities.Shewas seen in the office on 09/10/2018 noting an increase in palpitations with associated dizziness. She continued to be able to exercise without issues.In this setting, she underwent 2 separate 14-day Ziomonitors with each showing normal sinus rhythm with isolated PVCs with the first monitor showing an overall burden of 2.4% which was felt to be overestimated secondary to artifact. The second Ziomonitor showed sinus rhythm with rare PACs and PVCs with an overall burden of less than 1%with rare ventricular couplets/triplets/ventricular bigeminy/trigeminy.She was seen in 09/2018 noting an improvement in her  tachypalpitations and flushing with the initiation of Lopressor. Given ventricular ectopy noted on outpatient cardiac monitoring she underwent ETT in 10/2018 which showed no evidence of ischemia and average exercise capacity with an exercise duration of 7 minutes and 38 seconds achieving a workload of 9.5 METs.  Echo ordered by outside office in 04/2020 demonstrated, normal LV systolic function with an EF of 60 to 65%, normal wall motion, diastolic dysfunction, mild mitral regurgitation, and trace tricuspid regurgitation.  She was last seen in the office in 06/2020 and doing very well from a cardiac perspective.  She noted resolution of palpitations and was without chest pain.  She did note mild dizziness with quick positional changes.  She was continued on low-dose Lopressor.  She was seen by an urgent care on 09/28/2020 with chest pressure.  Tenderness was noted when she pressed on the chest.  Her symptoms were reproducible to palpation on their exam.  EKG showed NSR, 71 bpm, no acute ST-T changes.  High-sensitivity troponin negative x1.  Chest x-ray without acute cardiopulmonary abnormality.  Symptoms were suspected to be musculoskeletal in etiology.  She comes in today for follow-up of the above urgent care visit.  She indicates when she woke up on the morning of 2/4 in first thing she noticed was a substernal to slightly left-sided chest heaviness.  It did not feel like an elephant sitting on her chest though it was heavy.  There were no associated symptoms with this.  Of note, she had recently been treated for a potential sinus infection with azithromycin.  She has also been diagnosed with Covid toes.  She had a negative home Covid test.  No PCR.  Due to  persisting symptoms she mentioned her chest discomfort to her chiropractor later that day who recommended she be seen at an urgent care as outlined above.  Since that initial episode she has not had any further chest heaviness and is asymptomatic at this  time.  Symptoms did not feel like her prior palpitations.  She has been under significant stress at work and wonders if this has contributed to her symptoms and with her mildly elevated BP.  She is tolerating cardiac medications.  Otherwise, she does not have any issues or concerns at this time.   Labs independently reviewed: 09/2020 - potassium 4.3, BUN 25, serum creatinine 0.84, albumin 4.5, AST/ALT normal, Hgb 14.0, PLT 233 04/2020 - TSH normal, TC 149, TG 118, HDL 55, LDL 73 02/2020 - magnesium 2.0   Past Medical History:  Diagnosis Date  . Anxiety   . Cancer ([place])    cervical  . Cervical cancer, FIGO stage IB1 ([place]) 2005   Cervical  . GERD (gastroesophageal reflux disease)   . Headache   . Hyperlipidemia   . Hypertension   . Migraine   . Shingles 2019    Past Surgical History:  Procedure Laterality Date  . ABDOMINAL HYSTERECTOMY  2005   Radical hysterectomy by Dr Clarene Essex at [facility] cervical cancer 1B1  . BREAST CYST ASPIRATION Left 05/30/2013  . CERVICAL DISC ARTHROPLASTY Right 06/11/2016   Procedure: CERVICAL ANTERIOR [place] ARTHROPLASTY CERVICAL 5-7;  Surgeon: Blanche East, MD;  Location: [facility] ORS;  Service: Neurosurgery;  Laterality: Right;  . DIAGNOSTIC LAPAROSCOPY  2005  . DILATION AND CURETTAGE OF UTERUS     SAB  . 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   Marland Kitchen NASAL SINUS SURGERY      Current Medications: Current Meds  Medication Sig  . ALPRAZolam (XANAX) 0.5 MG tablet Take 1 tablet (0.5 mg total) by mouth at bedtime as needed for anxiety.  Marland Kitchen atorvastatin (LIPITOR) 10 MG tablet TAKE ONE (1) TABLET BY MOUTH ONCE DAILY AT 6 IN THE EVENING  . cetirizine (ZYRTEC) 10 MG tablet Take 10 mg by mouth daily.  . cetirizine (ZYRTEC) 10 MG tablet Take 10 mg by mouth daily.  . cyclobenzaprine (FLEXERIL) 5 MG tablet Take 1 tablet (5 mg total) by mouth 3 (three) times daily as needed for muscle spasms.  Marland Kitchen desonide (DESOWEN) 0.05 % cream APPLY A  SMALL AMOUNT TO SKIN TWICE DAILYAS NEEDED  . estradiol (VIVELLE-DOT) 0.0375 MG/24HR APPLY 1 PATCH EXTERNALLY TO THE SKIN 2 TIMES A WEEK  . fluticasone (FLONASE) 50 MCG/ACT nasal spray Place 2 sprays into both nostrils daily.  . metoprolol tartrate (LOPRESSOR) 100 MG tablet Take 1 tablet (100 mg total) by mouth once for 1 dose. Take 2 hours prior to CT.  . metoprolol tartrate (LOPRESSOR) 25 MG tablet TAKE 1/2 TABLET BY MOUTH TWICE DAILY  . Multiple Vitamin (MULTIVITAMIN) tablet Take 1 tablet by mouth daily.  . naproxen sodium (ANAPROX) 550 MG tablet Take 1 tablet (550 mg total) by mouth 2 (two) times daily with a meal.  . omeprazole (PRILOSEC) 20 MG capsule Take 1 capsule (20 mg total) by mouth daily.  . rizatriptan (MAXALT) 5 MG tablet Take 1 tablet (5 mg total) by mouth as needed for migraine.  . valACYclovir (VALTREX) 1000 MG tablet Take 1 tablet (1,000 mg total) by mouth as needed.  . valsartan-hydrochlorothiazide (DIOVAN-HCT) 80-12.5 MG tablet TAKE (1) TABLET BY MOUTH EVERY DAY  . venlafaxine XR (EFFEXOR-XR) 37.5 MG 24 hr  capsule TAKE (1) CAPSULE BY MOUTH EVERY DAY WITHBREAKFAST    Allergies:   Claritin-d 12 hour [loratadine-pseudoephedrine er] and Sulfa antibiotics   Social History   Socioeconomic History  . Marital status: Divorced    Spouse name: Not on file  . Number of children: 0  . Years of education: Not on file  . Highest education level: Not on file  Occupational History  . Not on file  Tobacco Use  . Smoking status: Never Smoker  . Smokeless tobacco: Never Used  Vaping Use  . Vaping Use: Never used  Substance and Sexual Activity  . Alcohol use: Yes    Comment: occassional  . Drug use: No  . Sexual activity: Yes    Partners: Male    Birth control/protection: Surgical  Other Topics Concern  . Not on file  Social History Narrative  . Not on file   Social Determinants of Health   Financial Resource Strain: Not on file  Food Insecurity: Not on file   Transportation Needs: Not on file  Physical Activity: Not on file  Stress: Not on file  Social Connections: Not on file     Family History:  The patient's family history includes Diabetes in her father; Heart attack in her father; Heart disease in her father; Heart failure in her father; Hyperlipidemia in her brother and mother; Hypertension in her father; Lung cancer in her mother. There is no history of Breast cancer.  ROS:   Review of Systems  Constitutional: Positive for malaise/fatigue. Negative for chills, diaphoresis, fever and weight loss.  HENT: Positive for sinus pain. Negative for congestion.        Ear fullness  Eyes: Negative for discharge and redness.  Respiratory: Negative for cough, sputum production, shortness of breath and wheezing.   Cardiovascular: Positive for chest pain. Negative for palpitations, orthopnea, claudication, leg swelling and PND.  Gastrointestinal: Negative for abdominal pain, heartburn, nausea and vomiting.  Musculoskeletal: Negative for falls and myalgias.  Skin: Negative for rash.  Neurological: Positive for headaches. Negative for dizziness, tingling, tremors, sensory change, speech change, focal weakness, loss of consciousness and weakness.  Endo/Heme/Allergies: Does not bruise/bleed easily.  Psychiatric/Behavioral: Negative for substance abuse. The patient is not nervous/anxious.   All other systems reviewed and are negative.    EKGs/Labs/Other Studies Reviewed:    Studies reviewed were summarized above. The additional studies were reviewed today:   2D echo (outside office) 04/2020: EF 60 to 65%, normal wall motion, diastolic dysfunction, normal RV systolic function and ventricular cavity size, mild mitral regurgitation, trace tricuspid regurgitation __________  ETT 10/2018:  Blood pressure demonstrated a normal response to exercise.  There was no ST segment deviation noted during stress.  No T wave inversion was noted during  stress.   Normal treadmill stress test with no evidence of ischemia. Average exercise capacity with an exercise duration of 7 minutes and 38 seconds achieving a workload of 9.5 METS. __________  Elwyn Reach patch-2 08/2018: Normal sinus rhythm with an average heart rate of 74 bpm. Rare PACs and PVCs.  Overall burden is less than 1%. __________  Elwyn Reach patch-1 08/2018: Normal sinus rhythm with an average heart rate of 74 bpm. Isolated PACs. Occasional PVCs with an overall burden of 2.4%.  The burden of PVCs might be overestimated due to artifacts. __________  2D echo 03/2018: - Left ventricle: The cavity size was normal. Wall thickness was  normal. Systolic function was normal. The estimated ejection  fraction was in the range of  60% to 65%. Wall motion was normal;  there were no regional wall motion abnormalities. Left  ventricular diastolic function parameters were normal.  - Right ventricle: The cavity size was normal. Wall thickness was  at the upper limits of normal. Systolic function was normal.  - Pulmonary arteries: Systolic pressure could not be accurately  estimated.  __________  48-hour Holter (outside office) 03/2018: Sinus rhythm with an average heart rate of 70 bpm (range 41 to 118 bpm) with a 1% burden of PVCs and a less than 1% burden of PACs   EKG:  EKG is ordered today.  The EKG ordered today demonstrates NSR, 70 bpm, low voltage QRS, no acute ST-T changes, when compared to prior tracing in the office PVCs are no longer present  Recent Labs: 03/02/2020: Magnesium 2.0 05/18/2020: TSH 1.030 09/28/2020: ALT 32; BUN 25; Creatinine, Ser 0.84; Hemoglobin 14.0; Platelets 233; Potassium 4.3; Sodium 138  Recent Lipid Panel    Component Value Date/Time   CHOL 149 05/18/2020 0828   TRIG 118 05/18/2020 0828   HDL 55 05/18/2020 0828   LDLCALC 73 05/18/2020 0828    PHYSICAL EXAM:    VS:  BP 140/70 (BP Location: Left Arm, Patient Position: Sitting, Cuff Size: Normal)    Pulse 70   Ht 5\' 10"  (1.778 m)   Wt 163 lb (73.9 kg)   SpO2 98%   BMI 23.39 kg/m   BMI: Body mass index is 23.39 kg/m.  Physical Exam Vitals reviewed.  Constitutional:      Appearance: She is well-developed and well-nourished.  HENT:     Head: Normocephalic and atraumatic.  Eyes:     General:        Right eye: No discharge.        Left eye: No discharge.  Neck:     Vascular: No JVD.  Cardiovascular:     Rate and Rhythm: Normal rate and regular rhythm.     Pulses: No midsystolic click and no opening snap.          Posterior tibial pulses are 2+ on the right side and 2+ on the left side.     Heart sounds: Normal heart sounds, S1 normal and S2 normal. Heart sounds not distant. No murmur heard. No friction rub.     Comments: Palpation of the left parasternal region is tender though does not reproduce the same pain that she experienced on 2/4. Pulmonary:     Effort: Pulmonary effort is normal. No respiratory distress.     Breath sounds: Normal breath sounds. No decreased breath sounds, wheezing or rales.  Chest:     Chest wall: No tenderness.  Abdominal:     General: There is no distension.     Palpations: Abdomen is soft.     Tenderness: There is no abdominal tenderness.  Musculoskeletal:        General: No edema.     Cervical back: Normal range of motion.  Skin:    General: Skin is warm and dry.     Nails: There is no clubbing or cyanosis.  Neurological:     Mental Status: She is alert and oriented to person, place, and time.  Psychiatric:        Mood and Affect: Mood and affect normal.        Speech: Speech normal.        Behavior: Behavior normal.        Thought Content: Thought content normal.  Judgment: Judgment normal.     Wt Readings from Last 3 Encounters:  10/12/20 163 lb (73.9 kg)  09/28/20 155 lb (70.3 kg)  06/26/20 165 lb (74.8 kg)     ASSESSMENT & PLAN:   1. Precordial pain: Currently chest pain-free.  Of uncertain etiology.  Significant  risk factors include hypertension, hyperlipidemia, and family history of CAD.  Myopericarditis less unlikely with normal troponin and EKG.  She is tender to palpation along the left parasternal region though this is not the same pain she experienced on 2/4.  It remains unclear if she had a Covid infection in the above timeframe.  Schedule coronary CTA given the precordial chest heaviness early in the morning concerning for cardiac etiology.  If this is unrevealing no further ischemic testing would be indicated at this time.  2. Palpitations/PVCs: Quiescent.  Continue Lopressor.  Recent lytes at goal.  3. HTN: Blood pressure is mildly elevated at triage, though historically this is well controlled.  She remains on Lopressor and Diovan/HCT.  Low-sodium diet recommended.  4. Diastolic dysfunction: Noted on outside echo.  She appears euvolemic and well compensated.  Weight is stable.  Continued optimal blood pressure control recommended.  5. HLD: LDL 73 from 04/2020.  She remains on atorvastatin 10 mg which is followed by her PCP.  If she is found to have significant coronary artery calcium on imaging as outlined above, would aim for a goal LDL less than 70.  Disposition: F/u with Dr. Fletcher Anon or an APP in 2 months.   Medication Adjustments/Labs and Tests Ordered: Current medicines are reviewed at length with the patient today.  Concerns regarding medicines are outlined above. Medication changes, Labs and Tests ordered today are summarized above and listed in the Patient Instructions accessible in Encounters.   Signed, Christell Faith, PA-C 10/12/2020 11:41 AM     [facility] [address] [place], [place] [address] [phone]

## 2020-10-12 ENCOUNTER — Other Ambulatory Visit: Payer: Self-pay

## 2020-10-12 ENCOUNTER — Ambulatory Visit: Payer: BC Managed Care – PPO | Admitting: Physician Assistant

## 2020-10-12 ENCOUNTER — Encounter: Payer: Self-pay | Admitting: Physician Assistant

## 2020-10-12 VITALS — BP 140/70 | HR 70 | Ht 70.0 in | Wt 163.0 lb

## 2020-10-12 DIAGNOSIS — R072 Precordial pain: Secondary | ICD-10-CM | POA: Diagnosis not present

## 2020-10-12 DIAGNOSIS — R002 Palpitations: Secondary | ICD-10-CM | POA: Diagnosis not present

## 2020-10-12 DIAGNOSIS — R079 Chest pain, unspecified: Secondary | ICD-10-CM

## 2020-10-12 DIAGNOSIS — I10 Essential (primary) hypertension: Secondary | ICD-10-CM | POA: Diagnosis not present

## 2020-10-12 DIAGNOSIS — I493 Ventricular premature depolarization: Secondary | ICD-10-CM

## 2020-10-12 DIAGNOSIS — E782 Mixed hyperlipidemia: Secondary | ICD-10-CM

## 2020-10-12 MED ORDER — METOPROLOL TARTRATE 100 MG PO TABS: 100.0000 mg | ORAL_TABLET | Freq: Once | ORAL | 0 refills | Status: DC

## 2020-10-12 NOTE — Patient Instructions (Addendum)
Medication Instructions:  Your physician recommends that you continue on your current medications as directed. Please refer to the Current Medication list given to you today.  *If you need a refill on your cardiac medications before your next appointment, please call your pharmacy*   Lab Work: None at this time.  If you have labs (blood work) drawn today and your tests are completely normal, you will receive your results only by: Marland Kitchen MyChart Message (if you have MyChart) OR . A paper copy in the mail If you have any lab test that is abnormal or we need to change your treatment, we will call you to review the results.   Testing/Procedures:  Your cardiac CT will be scheduled at one of the below locations:   [facility] [address] [place], [place] [address] [phone]  [place] [address] [place], [place] [address] [phone]  If scheduled at [facility], please arrive at the [facility] main entrance (entrance A) of [facility] 30 minutes prior to test start time. Proceed to the [facility] Radiology Department (first floor) to check-in and test prep.  If scheduled at [facility], please arrive 15 mins early for check-in and test prep.  Please follow these instructions carefully (unless otherwise directed):  On the Night Before the Test: . Be sure to Drink plenty of water. . Do not consume any caffeinated/decaffeinated beverages or chocolate 12 hours prior to your test. . Do not take any antihistamines 12 hours prior to your test.  On the Day of the Test: . Drink plenty of water until 1 hour prior to the test. . Do not eat any food 4 hours prior to the test. . You may take your regular medications prior to the test.  . Take metoprolol (Lopressor) two hours prior to test.- Lopressor 100 mg . HOLD Furosemide/Hydrochlorothiazide morning of the  test. . FEMALES- please wear underwire-free bra if available       After the Test: . Drink plenty of water. . After receiving IV contrast, you may experience a mild flushed feeling. This is normal. . On occasion, you may experience a mild rash up to 24 hours after the test. This is not dangerous. If this occurs, you can take Benadryl 25 mg and increase your fluid intake. . If you experience trouble breathing, this can be serious. If it is severe call 911 IMMEDIATELY. If it is mild, please call our office. . If you take any of these medications: Glipizide/Metformin, Avandament, Glucavance, please do not take 48 hours after completing test unless otherwise instructed.   Once we have confirmed authorization from your insurance company, we will call you to set up a date and time for your test. Based on how quickly your insurance processes prior authorizations requests, please allow up to 4 weeks to be contacted for scheduling your Cardiac CT appointment. Be advised that routine Cardiac CT appointments could be scheduled as many as 8 weeks after your provider has ordered it.  For non-scheduling related questions, please contact the cardiac imaging nurse navigator should you have any questions/concerns: Marchia Bond, Cardiac Imaging Nurse Navigator Gordy Clement, Cardiac Imaging Nurse Navigator [facility] Direct Office Dial: [phone]   For scheduling needs, including cancellations and rescheduling, please call [place], [phone].   Follow-Up: At [facility], you and your health needs are our priority.  As part of our continuing mission to provide  you with exceptional heart care, we have created designated Provider Care Teams.  These Care Teams include your primary Cardiologist (physician) and Advanced Practice Providers (APPs -  Physician Assistants and Nurse Practitioners) who all work together to provide you with the care you need, when you need it.  We  recommend signing up for the patient portal called "MyChart".  Sign up information is provided on this After Visit Summary.  MyChart is used to connect with patients for Virtual Visits (Telemedicine).  Patients are able to view lab/test results, encounter notes, upcoming appointments, etc.  Non-urgent messages can be sent to your provider as well.   To learn more about what you can do with MyChart, go to [email or web address].    Your next appointment:   2 month(s)  The format for your next appointment:   In Person  Provider:   You may see Kathlyn Sacramento, MD or one of the following Advanced Practice Providers on your designated Care Team:   Christell Faith, PA-C    Cardiac CT Angiogram A cardiac CT angiogram is a procedure to look at the heart and the area around the heart. It may be done to help find the cause of chest pains or other symptoms of heart disease. During this procedure, a substance called contrast dye is injected into the blood vessels in the area to be checked. A large X-ray machine, called a CT scanner, then takes detailed pictures of the heart and the surrounding area. The procedure is also sometimes called a coronary CT angiogram, coronary artery scanning, or CTA. A cardiac CT angiogram allows the health care provider to see how well blood is flowing to and from the heart. The health care provider will be able to see if there are any problems, such as:  Blockage or narrowing of the coronary arteries in the heart.  Fluid around the heart.  Signs of weakness or disease in the muscles, valves, and tissues of the heart. Tell a health care provider about:  Any allergies you have. This is especially important if you have had a previous allergic reaction to contrast dye.  All medicines you are taking, including vitamins, herbs, eye drops, creams, and over-the-counter medicines.  Any blood disorders you have.  Any surgeries you have had.  Any medical conditions you  have.  Whether you are pregnant or may be pregnant.  Any anxiety disorders, chronic pain, or other conditions you have that may increase your stress or prevent you from lying still. What are the risks? Generally, this is a safe procedure. However, problems may occur, including:  Bleeding.  Infection.  Allergic reactions to medicines or dyes.  Damage to other structures or organs.  Kidney damage from the contrast dye that is used.  Increased risk of cancer from radiation exposure. This risk is low. Talk with your health care provider about: ? The risks and benefits of testing. ? How you can receive the lowest dose of radiation. What happens before the procedure?  Wear comfortable clothing and remove any jewelry, glasses, dentures, and hearing aids.  Follow instructions from your health care provider about eating and drinking. This may include: ? For 12 hours before the procedure -- avoid caffeine. This includes tea, coffee, soda, energy drinks, and diet pills. Drink plenty of water or other fluids that do not have caffeine in them. Being well hydrated can prevent complications. ? For 4-6 hours before the procedure -- stop eating and drinking. The contrast dye can cause nausea, but this is  less likely if your stomach is empty.  Ask your health care provider about changing or stopping your regular medicines. This is especially important if you are taking diabetes medicines, blood thinners, or medicines to treat problems with erections (erectile dysfunction). What happens during the procedure?  Hair on your chest may need to be removed so that small sticky patches called electrodes can be placed on your chest. These will transmit information that helps to monitor your heart during the procedure.  An IV will be inserted into one of your veins.  You might be given a medicine to control your heart rate during the procedure. This will help to ensure that good images are obtained.  You  will be asked to lie on an exam table. This table will slide in and out of the CT machine during the procedure.  Contrast dye will be injected into the IV. You might feel warm, or you may get a metallic taste in your mouth.  You will be given a medicine called nitroglycerin. This will relax or dilate the arteries in your heart.  The table that you are lying on will move into the CT machine tunnel for the scan.  The person running the machine will give you instructions while the scans are being done. You may be asked to: ? Keep your arms above your head. ? Hold your breath. ? Stay very still, even if the table is moving.  When the scanning is complete, you will be moved out of the machine.  The IV will be removed. The procedure may vary among health care providers and hospitals.   What can I expect after the procedure? After your procedure, it is common to have:  A metallic taste in your mouth from the contrast dye.  A feeling of warmth.  A headache from the nitroglycerin. Follow these instructions at home:  Take over-the-counter and prescription medicines only as told by your health care provider.  If you are told, drink enough fluid to keep your urine pale yellow. This will help to flush the contrast dye out of your body.  Most people can return to their normal activities right after the procedure. Ask your health care provider what activities are safe for you.  It is up to you to get the results of your procedure. Ask your health care provider, or the department that is doing the procedure, when your results will be ready.  Keep all follow-up visits as told by your health care provider. This is important. Contact a health care provider if:  You have any symptoms of allergy to the contrast dye. These include: ? Shortness of breath. ? Rash or hives. ? A racing heartbeat. Summary  A cardiac CT angiogram is a procedure to look at the heart and the area around the heart. It  may be done to help find the cause of chest pains or other symptoms of heart disease.  During this procedure, a large X-ray machine, called a CT scanner, takes detailed pictures of the heart and the surrounding area after a contrast dye has been injected into blood vessels in the area.  Ask your health care provider about changing or stopping your regular medicines before the procedure. This is especially important if you are taking diabetes medicines, blood thinners, or medicines to treat erectile dysfunction.  If you are told, drink enough fluid to keep your urine pale yellow. This will help to flush the contrast dye out of your body. This information is not  intended to replace advice given to you by your health care provider. Make sure you discuss any questions you have with your health care provider. Document Revised: 04/06/2019 Document Reviewed: 04/06/2019 Elsevier Patient Education  2021 [employer].

## 2020-10-16 ENCOUNTER — Ambulatory Visit: Payer: BC Managed Care – PPO | Admitting: Hospice and Palliative Medicine

## 2020-10-16 ENCOUNTER — Other Ambulatory Visit: Payer: Self-pay

## 2020-10-16 ENCOUNTER — Encounter: Payer: Self-pay | Admitting: Hospice and Palliative Medicine

## 2020-10-16 VITALS — BP 130/60 | HR 70 | Temp 97.5°F | Resp 16 | Ht 70.0 in | Wt 164.0 lb

## 2020-10-16 DIAGNOSIS — I10 Essential (primary) hypertension: Secondary | ICD-10-CM | POA: Diagnosis not present

## 2020-10-16 DIAGNOSIS — R072 Precordial pain: Secondary | ICD-10-CM

## 2020-10-16 DIAGNOSIS — F411 Generalized anxiety disorder: Secondary | ICD-10-CM | POA: Diagnosis not present

## 2020-10-16 DIAGNOSIS — I5189 Other ill-defined heart diseases: Secondary | ICD-10-CM

## 2020-10-16 DIAGNOSIS — H9203 Otalgia, bilateral: Secondary | ICD-10-CM

## 2020-10-16 NOTE — Progress Notes (Signed)
[facility] [place], [place] [address]  Internal MEDICINE  Office Visit Note  Patient Name: Laura Barber  [id number]  [id number]  Date of Service: 10/16/2020  Chief Complaint  Patient presents with  . 4 month follow up  . Gastroesophageal Reflux  . Hyperlipidemia  . Hypertension  . Quality Metric Gaps    Flu, covid booster  . Ear Pain    HPI Patient is here for routine follow-up Since our last visit has been seen in emergency department 2/4 for chest pressure/pain, history of PVC's but had felt earlier in that day increased frequency of PVC's, negative troponin levels and normal EKG Has had in office follow-up with cardiologist--scheduled for coronary CTA in the next week due to precordial nature of chest pain Since emergency department visit she has not had any further episodes of chest pain Has been under an increased amount of stress in both her personal and professional life Has been on low dose Effexor for some time, initially prescribed several years prior for migraine prevention?   Treated early January for prolonged symptoms of sinusitis--lingering bilateral ear discomfort and pain is drainage passages below ears into neck, denies lingering cough, congestion or sinus pain  Current Medication: Outpatient Encounter Medications as of 10/16/2020  Medication Sig  . ALPRAZolam (XANAX) 0.5 MG tablet Take 1 tablet (0.5 mg total) by mouth at bedtime as needed for anxiety.  Marland Kitchen atorvastatin (LIPITOR) 10 MG tablet TAKE ONE (1) TABLET BY MOUTH ONCE DAILY AT 6 IN THE EVENING  . cetirizine (ZYRTEC) 10 MG tablet Take 10 mg by mouth daily.  . cetirizine (ZYRTEC) 10 MG tablet Take 10 mg by mouth daily.  . cyclobenzaprine (FLEXERIL) 5 MG tablet Take 1 tablet (5 mg total) by mouth 3 (three) times daily as needed for muscle spasms.  Marland Kitchen desonide (DESOWEN) 0.05 % cream APPLY A SMALL AMOUNT TO SKIN TWICE DAILYAS NEEDED  . estradiol (VIVELLE-DOT) 0.0375 MG/24HR APPLY  1 PATCH EXTERNALLY TO THE SKIN 2 TIMES A WEEK  . fluticasone (FLONASE) 50 MCG/ACT nasal spray Place 2 sprays into both nostrils daily.  . metoprolol tartrate (LOPRESSOR) 100 MG tablet Take 1 tablet (100 mg total) by mouth once for 1 dose. Take 2 hours prior to CT.  . metoprolol tartrate (LOPRESSOR) 25 MG tablet TAKE 1/2 TABLET BY MOUTH TWICE DAILY  . Multiple Vitamin (MULTIVITAMIN) tablet Take 1 tablet by mouth daily.  . naproxen sodium (ANAPROX) 550 MG tablet Take 1 tablet (550 mg total) by mouth 2 (two) times daily with a meal.  . omeprazole (PRILOSEC) 20 MG capsule Take 1 capsule (20 mg total) by mouth daily.  . rizatriptan (MAXALT) 5 MG tablet Take 1 tablet (5 mg total) by mouth as needed for migraine.  . valACYclovir (VALTREX) 1000 MG tablet Take 1 tablet (1,000 mg total) by mouth as needed.  . valsartan-hydrochlorothiazide (DIOVAN-HCT) 80-12.5 MG tablet TAKE (1) TABLET BY MOUTH EVERY DAY  . venlafaxine XR (EFFEXOR-XR) 37.5 MG 24 hr capsule TAKE (1) CAPSULE BY MOUTH EVERY DAY WITHBREAKFAST   No facility-administered encounter medications on file as of 10/16/2020.    Surgical History: Past Surgical History:  Procedure Laterality Date  . ABDOMINAL HYSTERECTOMY  2005   Radical hysterectomy by Dr Clarene Essex at [facility] cervical cancer 1B1  . BREAST CYST ASPIRATION Left 05/30/2013  . CERVICAL DISC ARTHROPLASTY Right 06/11/2016   Procedure: CERVICAL ANTERIOR [place] ARTHROPLASTY CERVICAL 5-7;  Surgeon: Blanche East, MD;  Location: [facility] ORS;  Service: Neurosurgery;  Laterality:  Right;  Marland Kitchen DIAGNOSTIC LAPAROSCOPY  2005  . DILATION AND CURETTAGE OF UTERUS     SAB  . 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   Marland Kitchen NASAL SINUS SURGERY      Medical History: Past Medical History:  Diagnosis Date  . Anxiety   . Cancer ([place])    cervical  . Cervical cancer, FIGO stage IB1 ([place]) 2005   Cervical  . GERD (gastroesophageal reflux disease)   . Headache   .  Hyperlipidemia   . Hypertension   . Migraine   . Shingles 2019    Family History: Family History  Problem Relation Age of Onset  . Lung cancer Mother   . Hyperlipidemia Mother   . Diabetes Father   . Hypertension Father   . Heart failure Father   . Heart attack Father   . Heart disease Father   . Hyperlipidemia Brother   . Breast cancer Neg Hx     Social History   Socioeconomic History  . Marital status: Divorced    Spouse name: Not on file  . Number of children: 0  . Years of education: Not on file  . Highest education level: Not on file  Occupational History  . Not on file  Tobacco Use  . Smoking status: Never Smoker  . Smokeless tobacco: Never Used  Vaping Use  . Vaping Use: Never used  Substance and Sexual Activity  . Alcohol use: Yes    Comment: occassional  . Drug use: No  . Sexual activity: Yes    Partners: Male    Birth control/protection: Surgical  Other Topics Concern  . Not on file  Social History Narrative  . Not on file   Social Determinants of Health   Financial Resource Strain: Not on file  Food Insecurity: Not on file  Transportation Needs: Not on file  Physical Activity: Not on file  Stress: Not on file  Social Connections: Not on file  Intimate Partner Violence: Not on file      Review of Systems  Constitutional: Negative for chills, diaphoresis and fatigue.  HENT: Positive for ear pain. Negative for postnasal drip and sinus pressure.   Eyes: Negative for photophobia, discharge, redness, itching and visual disturbance.  Respiratory: Positive for chest tightness. Negative for cough, shortness of breath and wheezing.   Cardiovascular: Negative for chest pain, palpitations and leg swelling.  Gastrointestinal: Negative for abdominal pain, constipation, diarrhea, nausea and vomiting.  Genitourinary: Negative for dysuria and flank pain.  Musculoskeletal: Negative for arthralgias, back pain, gait problem and neck pain.  Skin: Negative  for color change.  Allergic/Immunologic: Negative for environmental allergies and food allergies.  Neurological: Negative for dizziness and headaches.  Hematological: Does not bruise/bleed easily.  Psychiatric/Behavioral: Negative for agitation, behavioral problems (depression) and hallucinations.    Vital Signs: BP 130/60   Pulse 70   Temp (!) 97.5 F (36.4 C)   Resp 16   Ht 5\' 10"  (1.778 m)   Wt 164 lb (74.4 kg)   SpO2 98%   BMI 23.53 kg/m    Physical Exam Vitals reviewed.  Constitutional:      Appearance: Normal appearance. She is normal weight.  HENT:     Right Ear: Tympanic membrane normal.     Left Ear: Tympanic membrane normal.     Mouth/Throat:     Mouth: Mucous membranes are moist.     Pharynx: Oropharynx is clear.  Cardiovascular:     Rate and  Rhythm: Normal rate and regular rhythm.     Pulses: Normal pulses.     Heart sounds: Normal heart sounds.  Pulmonary:     Effort: Pulmonary effort is normal.     Breath sounds: Normal breath sounds.  Abdominal:     General: Abdomen is flat.     Palpations: Abdomen is soft.  Musculoskeletal:        General: Normal range of motion.     Cervical back: Normal range of motion.  Skin:    General: Skin is warm.  Neurological:     General: No focal deficit present.     Mental Status: She is alert and oriented to person, place, and time. Mental status is at baseline.  Psychiatric:        Mood and Affect: Mood normal.        Behavior: Behavior normal.        Thought Content: Thought content normal.        Judgment: Judgment normal.    Assessment/Plan: 1. Precordial pain Being managed by cardiology, scheduled for coronary CTA, appreciate cardiology input, will follow along with results of imaging  2. Generalized anxiety disorder Increase Effexor to 75 mg daily, double current dose Hold off on refills as she has plenty at home and will double dose, will contact office for refills  3. Diastolic dysfunction Not  requiring diuretics, continue with tight BP control as well as healthy weight  4. Essential hypertension BP and HR well controlled  5. Ear pain, bilateral Likely related to recent sinusitis, ear canal and TM normal, samples of Mucinex given in office today  General Counseling: Kelena verbalizes understanding of the findings of todays visit and agrees with plan of treatment. I have discussed any further diagnostic evaluation that may be needed or ordered today. We also reviewed her medications today. she has been encouraged to call the office with any questions or concerns that should arise related to todays visit.   Time spent: 30 Minutes Time spent includes review of chart, medications, test results and follow-up plan with the patient.  This patient was seen by Theodoro Grist AGNP-C in Collaboration with Dr Lavera Guise as a part of collaborative care agreement     Tanna Furry. Anihya Tuma AGNP-C Internal medicine

## 2020-10-24 ENCOUNTER — Telehealth (HOSPITAL_COMMUNITY): Payer: Self-pay | Admitting: *Deleted

## 2020-10-24 NOTE — Telephone Encounter (Signed)
Reaching out to patient to offer assistance regarding upcoming cardiac imaging study; pt verbalizes understanding of appt date/time, parking situation and where to check in, pre-test NPO status and medications ordered, and verified current allergies; name and call back number provided for further questions should they arise  Tate Jerkins RN Navigator Cardiac Imaging [facility] [phone] office [phone] cell  

## 2020-10-25 ENCOUNTER — Ambulatory Visit
Admission: RE | Admit: 2020-10-25 | Discharge: 2020-10-25 | Disposition: A | Discharge status: Home / Self Care | Payer: BC Managed Care – PPO | Source: Ambulatory Visit | Attending: Physician Assistant | Admitting: Physician Assistant

## 2020-10-25 ENCOUNTER — Other Ambulatory Visit: Payer: Self-pay

## 2020-10-25 DIAGNOSIS — R079 Chest pain, unspecified: Secondary | ICD-10-CM | POA: Diagnosis not present

## 2020-10-25 MED ORDER — NITROGLYCERIN 0.4 MG SL SUBL
0.8000 mg | SUBLINGUAL_TABLET | Freq: Once | SUBLINGUAL | Status: AC
  Administered 2020-10-25: 0.8 mg via SUBLINGUAL

## 2020-10-25 MED ORDER — IOHEXOL 350 MG/ML SOLN
75.0000 mL | Freq: Once | INTRAVENOUS | Status: AC | PRN
  Administered 2020-10-25: 75 mL via INTRAVENOUS

## 2020-10-25 NOTE — Progress Notes (Signed)
Patient tolerated procedure well. Ambulate w/o difficulty. Sitting in chair drinking water provided. Denies feeling dizzy or different than normal other than a slight HA. Encouraged to drink extra water today and reasoning explained. Verbalized understanding. All questions answered. ABC intact. No further needs. Discharge from procedure area w/o issues.

## 2020-10-26 ENCOUNTER — Other Ambulatory Visit: Payer: Self-pay

## 2020-10-26 DIAGNOSIS — I10 Essential (primary) hypertension: Secondary | ICD-10-CM

## 2020-10-26 MED ORDER — VALSARTAN-HYDROCHLOROTHIAZIDE 80-12.5 MG PO TABS: ORAL_TABLET | ORAL | 1 refills | Status: DC

## 2020-11-01 DIAGNOSIS — M9905 Segmental and somatic dysfunction of pelvic region: Secondary | ICD-10-CM | POA: Diagnosis not present

## 2020-11-19 ENCOUNTER — Other Ambulatory Visit: Payer: Self-pay | Admitting: Hospice and Palliative Medicine

## 2020-11-19 DIAGNOSIS — E785 Hyperlipidemia, unspecified: Secondary | ICD-10-CM

## 2020-11-22 ENCOUNTER — Other Ambulatory Visit: Payer: Self-pay | Admitting: Hospice and Palliative Medicine

## 2020-11-22 DIAGNOSIS — F411 Generalized anxiety disorder: Secondary | ICD-10-CM

## 2020-11-27 ENCOUNTER — Encounter: Payer: Self-pay | Admitting: Hospice and Palliative Medicine

## 2020-11-27 ENCOUNTER — Other Ambulatory Visit: Payer: Self-pay

## 2020-11-27 ENCOUNTER — Ambulatory Visit: Payer: BC Managed Care – PPO | Admitting: Hospice and Palliative Medicine

## 2020-11-27 VITALS — BP 134/86 | HR 90 | Temp 97.3°F | Resp 16 | Ht 70.0 in | Wt 167.4 lb

## 2020-11-27 DIAGNOSIS — R072 Precordial pain: Secondary | ICD-10-CM | POA: Diagnosis not present

## 2020-11-27 DIAGNOSIS — Z0001 Encounter for general adult medical examination with abnormal findings: Secondary | ICD-10-CM

## 2020-11-27 DIAGNOSIS — R5383 Other fatigue: Secondary | ICD-10-CM

## 2020-11-27 DIAGNOSIS — R300 Dysuria: Secondary | ICD-10-CM

## 2020-11-27 DIAGNOSIS — F411 Generalized anxiety disorder: Secondary | ICD-10-CM | POA: Diagnosis not present

## 2020-11-27 DIAGNOSIS — I10 Essential (primary) hypertension: Secondary | ICD-10-CM | POA: Diagnosis not present

## 2020-11-27 NOTE — Progress Notes (Signed)
[facility] [place], [place] [address]  Internal MEDICINE  Office Visit Note  Patient Name: Laura Barber  [id number]  [id number]  Date of Service: 11/29/2020  Chief Complaint  Patient presents with  . Annual Exam  . Hyperlipidemia  . Hypertension  . Gastroesophageal Reflux  . Anxiety     HPI Pt is here for routine health maintenance examination Has mammogram, breast and PAP with GYN Overall, things have been going well Had coronary CTA with cardiology which was negative, normal findings--has been cleared by cardiology Chest pain and pressure felt to be anxiety driven Has had no further chest pain or pressure episodes Did not increase her dose of Effexor as she was able to work through some of her personal stressors--feels anxiety remains well controlled on current dose of 37.5 mg Sinusitis symptoms resolved  Colonoscopy completed in 2018--repeat recommended in 10 years   Current Medication: Outpatient Encounter Medications as of 11/27/2020  Medication Sig  . ALPRAZolam (XANAX) 0.5 MG tablet Take 1 tablet (0.5 mg total) by mouth at bedtime as needed for anxiety.  Marland Kitchen atorvastatin (LIPITOR) 10 MG tablet TAKE (1) TABLET BY MOUTH EVERY DAY AT 6 IN THE EVENING  . cetirizine (ZYRTEC) 10 MG tablet Take 10 mg by mouth daily.  . cyclobenzaprine (FLEXERIL) 5 MG tablet Take 1 tablet (5 mg total) by mouth 3 (three) times daily as needed for muscle spasms.  Marland Kitchen desonide (DESOWEN) 0.05 % cream APPLY A SMALL AMOUNT TO SKIN TWICE DAILYAS NEEDED  . estradiol (VIVELLE-DOT) 0.0375 MG/24HR APPLY 1 PATCH EXTERNALLY TO THE SKIN 2 TIMES A WEEK  . fluticasone (FLONASE) 50 MCG/ACT nasal spray Place 2 sprays into both nostrils daily.  . metoprolol tartrate (LOPRESSOR) 100 MG tablet Take 1 tablet (100 mg total) by mouth once for 1 dose. Take 2 hours prior to CT.  . metoprolol tartrate (LOPRESSOR) 25 MG tablet TAKE 1/2 TABLET BY MOUTH TWICE DAILY  . Multiple Vitamin  (MULTIVITAMIN) tablet Take 1 tablet by mouth daily.  . naproxen sodium (ANAPROX) 550 MG tablet Take 1 tablet (550 mg total) by mouth 2 (two) times daily with a meal.  . omeprazole (PRILOSEC) 20 MG capsule Take 1 capsule (20 mg total) by mouth daily.  . rizatriptan (MAXALT) 5 MG tablet Take 1 tablet (5 mg total) by mouth as needed for migraine.  . valACYclovir (VALTREX) 1000 MG tablet Take 1 tablet (1,000 mg total) by mouth as needed.  . valsartan-hydrochlorothiazide (DIOVAN-HCT) 80-12.5 MG tablet Take one tablet by mouth daily  . venlafaxine XR (EFFEXOR-XR) 37.5 MG 24 hr capsule TAKE (1) CAPSULE BY MOUTH EVERY DAY WITHBREAKFAST   No facility-administered encounter medications on file as of 11/27/2020.    Surgical History: Past Surgical History:  Procedure Laterality Date  . ABDOMINAL HYSTERECTOMY  2005   Radical hysterectomy by Dr Clarene Essex at [facility] cervical cancer 1B1  . BREAST CYST ASPIRATION Left 05/30/2013  . CERVICAL DISC ARTHROPLASTY Right 06/11/2016   Procedure: CERVICAL ANTERIOR [place] ARTHROPLASTY CERVICAL 5-7;  Surgeon: Blanche East, MD;  Location: [facility] ORS;  Service: Neurosurgery;  Laterality: Right;  . DIAGNOSTIC LAPAROSCOPY  2005  . DILATION AND CURETTAGE OF UTERUS     SAB  . 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   Marland Kitchen NASAL SINUS SURGERY      Medical History: Past Medical History:  Diagnosis Date  . Anxiety   . Cancer (HCC)    cervical  . Cervical cancer, FIGO stage  IB1 ([place]) 2005   Cervical  . GERD (gastroesophageal reflux disease)   . Headache   . Hyperlipidemia   . Hypertension   . Migraine   . Shingles 2019    Family History: Family History  Problem Relation Age of Onset  . Lung cancer Mother   . Hyperlipidemia Mother   . Diabetes Father   . Hypertension Father   . Heart failure Father   . Heart attack Father   . Heart disease Father   . Hyperlipidemia Brother   . Breast cancer Neg Hx       Review of Systems   Constitutional: Negative for chills, diaphoresis and fatigue.  HENT: Negative for ear pain, postnasal drip and sinus pressure.   Eyes: Negative for photophobia, discharge, redness, itching and visual disturbance.  Respiratory: Negative for cough, shortness of breath and wheezing.   Cardiovascular: Negative for chest pain, palpitations and leg swelling.  Gastrointestinal: Negative for abdominal pain, constipation, diarrhea, nausea and vomiting.  Genitourinary: Negative for dysuria and flank pain.  Musculoskeletal: Negative for arthralgias, back pain, gait problem and neck pain.  Skin: Negative for color change.  Allergic/Immunologic: Negative for environmental allergies and food allergies.  Neurological: Negative for dizziness and headaches.  Hematological: Does not bruise/bleed easily.  Psychiatric/Behavioral: Negative for agitation, behavioral problems (depression) and hallucinations.     Vital Signs: BP 134/86   Pulse 90   Temp (!) 97.3 F (36.3 C)   Resp 16   Ht 5\' 10"  (1.778 m)   Wt 167 lb 6.4 oz (75.9 kg)   SpO2 98%   BMI 24.02 kg/m    Physical Exam Vitals reviewed.  Constitutional:      Appearance: Normal appearance. She is normal weight.  Cardiovascular:     Rate and Rhythm: Normal rate and regular rhythm.     Pulses: Normal pulses.     Heart sounds: Normal heart sounds.  Pulmonary:     Effort: Pulmonary effort is normal.     Breath sounds: Normal breath sounds.  Abdominal:     General: Abdomen is flat.     Palpations: Abdomen is soft.  Musculoskeletal:        General: Normal range of motion.     Cervical back: Normal range of motion.  Skin:    General: Skin is warm.  Neurological:     General: No focal deficit present.     Mental Status: She is alert and oriented to person, place, and time. Mental status is at baseline.  Psychiatric:        Mood and Affect: Mood normal.        Behavior: Behavior normal.        Thought Content: Thought content normal.         Judgment: Judgment normal.      LABS: Recent Results (from the past 2160 hour(s))  CBC with Differential     Status: None   Collection Time: 09/28/20  5:46 PM  Result Value Ref Range   WBC 9.3 4.0 - 10.5 K/uL   RBC 4.53 3.87 - 5.11 MIL/uL   Hemoglobin 14.0 12.0 - 15.0 g/dL   HCT 41.2 36.0 - 46.0 %   MCV 90.9 80.0 - 100.0 fL   MCH 30.9 26.0 - 34.0 pg   MCHC 34.0 30.0 - 36.0 g/dL   RDW 11.8 11.5 - 15.5 %   Platelets 233 150 - 400 K/uL   nRBC 0.0 0.0 - 0.2 %   Neutrophils Relative % 62 %  Neutro Abs 5.7 1.7 - 7.7 K/uL   Lymphocytes Relative 27 %   Lymphs Abs 2.5 0.7 - 4.0 K/uL   Monocytes Relative 8 %   Monocytes Absolute 0.7 0.1 - 1.0 K/uL   Eosinophils Relative 2 %   Eosinophils Absolute 0.2 0.0 - 0.5 K/uL   Basophils Relative 1 %   Basophils Absolute 0.1 0.0 - 0.1 K/uL   Immature Granulocytes 0 %   Abs Immature Granulocytes 0.02 0.00 - 0.07 K/uL    Comment: Performed at [facility], [address] [place], [place] [address]  Comprehensive metabolic panel     Status: Abnormal   Collection Time: 09/28/20  5:46 PM  Result Value Ref Range   Sodium 138 135 - 145 mmol/L   Potassium 4.3 3.5 - 5.1 mmol/L   Chloride 98 98 - 111 mmol/L   CO2 31 22 - 32 mmol/L   Glucose, Bld 80 70 - 99 mg/dL    Comment: Glucose reference range applies only to samples taken after fasting for at least 8 hours.   BUN 25 (H) 6 - 20 mg/dL   Creatinine, Ser 0.84 0.44 - 1.00 mg/dL   Calcium 9.2 8.9 - 10.3 mg/dL   Total Protein 7.3 6.5 - 8.1 g/dL   Albumin 4.5 3.5 - 5.0 g/dL   AST 26 15 - 41 U/L   ALT 32 0 - 44 U/L   Alkaline Phosphatase 42 38 - 126 U/L   Total Bilirubin 0.7 0.3 - 1.2 mg/dL   GFR, Estimated >60 >60 mL/min    Comment: (NOTE) Calculated using the CKD-EPI Creatinine Equation (2021)    Anion gap 9 5 - 15    Comment: Performed at [facility], [address], [place], [place] [address]  Troponin I (High Sensitivity)     Status: None   Collection  Time: 09/28/20  5:46 PM  Result Value Ref Range   Troponin I (High Sensitivity) 2 <18 ng/L    Comment: (NOTE) Elevated high sensitivity troponin I (hsTnI) values and significant  changes across serial measurements may suggest ACS but many other  chronic and acute conditions are known to elevate hsTnI results.  Refer to the "Links" section for chest pain algorithms and additional  guidance. Performed at [facility], [address], [place], [place] [address]   UA/M w/rflx Culture, Routine     Status: None   Collection Time: 11/27/20  3:42 PM   Specimen: Urine   Urine  Result Value Ref Range   Specific Gravity, UA 1.022 1.005 - 1.030   pH, UA 7.5 5.0 - 7.5   Color, UA Yellow Yellow   Appearance Ur Clear Clear   Leukocytes,UA Negative Negative   Protein,UA Negative Negative/Trace   Glucose, UA Negative Negative   Ketones, UA Negative Negative   RBC, UA Negative Negative   Bilirubin, UA Negative Negative   Urobilinogen, Ur 0.2 0.2 - 1.0 mg/dL   Nitrite, UA Negative Negative   Microscopic Examination Comment     Comment: Microscopic follows if indicated.   Microscopic Examination See below:     Comment: Microscopic was indicated and was performed.   Urinalysis Reflex Comment     Comment: This specimen will not reflex to a Urine Culture.  Microscopic Examination     Status: None   Collection Time: 11/27/20  3:42 PM   Urine  Result Value Ref Range   WBC, UA None seen 0 - 5 /hpf   RBC  0-2 0 - 2 /hpf   Epithelial Cells (non renal) 0-10 0 - 10 /hpf   Casts None seen None seen /lpf   Bacteria, UA None seen None seen/Few   Assessment/Plan: 1. Encounter for routine adult health examination with abnormal findings Well appearing 55 year old female Up to date PHM  2. Essential hypertension BP and HR well controlled on current management  3. Precordial pain Negative work-up with cardiology, felt to be anxiety driven, no further episodes of chest pain  4.  Generalized anxiety disorder Symptoms well controlled at this time on current dosing of Effexor, continue to monitor  5. Dysuria - UA/M w/rflx Culture, Routine  6. Other fatigue - Lipid Panel With LDL/HDL Ratio - TSH + free T4  General Counseling: Inaaya verbalizes understanding of the findings of todays visit and agrees with plan of treatment. I have discussed any further diagnostic evaluation that may be needed or ordered today. We also reviewed her medications today. she has been encouraged to call the office with any questions or concerns that should arise related to todays visit.    Counseling:    Orders Placed This Encounter  Procedures  . Microscopic Examination  . UA/M w/rflx Culture, Routine  . Lipid Panel With LDL/HDL Ratio  . TSH + free T4   Total time spent: 30 Minutes  Time spent includes review of chart, medications, test results, and follow up plan with the patient.   This patient was seen by Theodoro Grist AGNP-C Collaboration with Dr Lavera Guise as a part of collaborative care agreement   Tanna Furry. [facility] Internal Medicine

## 2020-11-28 LAB — MICROSCOPIC EXAMINATION
Bacteria, UA: NONE SEEN
Casts: NONE SEEN /lpf
WBC, UA: NONE SEEN /hpf (ref 0–5)

## 2020-11-28 LAB — UA/M W/RFLX CULTURE, ROUTINE
Bilirubin, UA: NEGATIVE
Glucose, UA: NEGATIVE
Ketones, UA: NEGATIVE
Leukocytes,UA: NEGATIVE
Nitrite, UA: NEGATIVE
Protein,UA: NEGATIVE
RBC, UA: NEGATIVE
Specific Gravity, UA: 1.022 (ref 1.005–1.030)
Urobilinogen, Ur: 0.2 mg/dL (ref 0.2–1.0)
pH, UA: 7.5 (ref 5.0–7.5)

## 2020-12-03 DIAGNOSIS — M9905 Segmental and somatic dysfunction of pelvic region: Secondary | ICD-10-CM | POA: Diagnosis not present

## 2020-12-11 NOTE — Progress Notes (Signed)
Cardiology Office Note    Date:  12/14/2020   ID:  [name], DOB [birth year], MRN [id number]  PCP:  Laura Guise, MD  Cardiologist:  Kathlyn Sacramento, MD  Electrophysiologist:  None   Chief Complaint: Follow-up  History of Present Illness:   Laura Barber is a 55 y.o. female with history of palpitations/PVCs, hypertension, hyperlipidemia, anxiety, and GERD who presents for follow up of coronary CTA.  Shewas evaluated by Dr. Fletcher Anon on 04/09/2018 for palpitations and PVCs. Shereported onset of palpitations over the prior few months that weredescribed as a fluttering sensation mostly at rest and most noticeable at night when she was trying to sleep. There was associated mild shortness of breath. Prior 48-hour Holter monitor showed 2400 PVCs in 48 hours. Upon cardiology review of the monitor there was noted to be quite a bit of motion artifact which may have interfered with the count. It wasfelt her PVC burden was much less than the reported 2400. She reported a family history remarkable for CAD, though not prematurely. There was no family history of sudden death. Echoin8/2019 showed an EF of 60 to 65%, no regional wall motion abnormalities, normal LV diastolic function, normal RV cavity size and systolic function. No significant valvular abnormalities.Shewas seen in the office on 09/10/2018 noting an increase in palpitations with associated dizziness. She continued to be able to exercise without issues.In this setting, she underwent 2 separate 14-day Ziomonitors with each showing normal sinus rhythm with isolated PVCs with the first monitor showing an overall burden of 2.4% which was felt to be overestimated secondary to artifact. The second Ziomonitor showed sinus rhythm with rare PACs and PVCs with an overall burden of less than 1%with rare ventricular couplets/triplets/ventricular bigeminy/trigeminy.She was seen in 09/2018 noting an improvement in her  tachypalpitations and flushing with the initiation of Lopressor. Given ventricular ectopy noted on outpatient cardiac monitoring she underwent ETT in 10/2018 which showed no evidence of ischemia and average exercise capacity with an exercise duration of 7 minutes and 38 seconds achieving a workload of 9.5 METs.  Echo ordered by outside office in 04/2020 demonstrated, normal LV systolic function with an EF of 60 to 65%, normal wall motion, diastolic dysfunction, mild mitral regurgitation, and trace tricuspid regurgitation.  She was seen in the office in 06/2020 and doing very well from a cardiac perspective.  She noted resolution of palpitations and was without chest pain.  She did note mild dizziness with quick positional changes.  She was continued on low-dose Lopressor.  She was seen by an urgent care on 09/28/2020 with chest pressure.  Tenderness was noted when she pressed on her chest.  Her symptoms were reproducible to palpation on their exam.  EKG showed NSR, 71 bpm, no acute ST-T changes.  High-sensitivity troponin negative x1.  Chest x-ray without acute cardiopulmonary abnormality.  Symptoms were suspected to be musculoskeletal in etiology.  She followed up with cardiology on 10/12/2020, and had not had any further chest discomfort.  She did note she was under significant stress.  She underwent coronary CTA on 10/25/2020, which showed a calcium score of 0 and no evidence of CAD.   She comes in doing very well from a cardiac perspective.  No further chest pain, dyspnea, palpitations, dizziness, presyncope, or syncope.  No lower extremity swelling.  She is tolerating all cardiac medications without issues.  Blood pressure remains well controlled.  She would like to get back to her regular exercise regimen.  She does not have  any issues or concerns at this time.   Labs independently reviewed: 09/2020 - potassium 4.3, BUN 25, serum creatinine 0.84, albumin 4.5, AST/ALT normal, Hgb 14.0, PLT 233 04/2020-TSH  normal, TC 149, TG 118, HDL 55, LDL 73 02/2020-magnesium 2.0  Past Medical History:  Diagnosis Date  . Anxiety   . Cancer ([place])    cervical  . Cervical cancer, FIGO stage IB1 ([place]) 2005   Cervical  . GERD (gastroesophageal reflux disease)   . Headache   . Hyperlipidemia   . Hypertension   . Migraine   . Shingles 2019    Past Surgical History:  Procedure Laterality Date  . ABDOMINAL HYSTERECTOMY  2005   Radical hysterectomy by Dr Clarene Essex at [facility] cervical cancer 1B1  . BREAST CYST ASPIRATION Left 05/30/2013  . CERVICAL DISC ARTHROPLASTY Right 06/11/2016   Procedure: CERVICAL ANTERIOR [place] ARTHROPLASTY CERVICAL 5-7;  Surgeon: Blanche East, MD;  Location: [facility] ORS;  Service: Neurosurgery;  Laterality: Right;  . DIAGNOSTIC LAPAROSCOPY  2005  . DILATION AND CURETTAGE OF UTERUS     SAB  . 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   Marland Kitchen NASAL SINUS SURGERY      Current Medications: Current Meds  Medication Sig  . ALPRAZolam (XANAX) 0.5 MG tablet Take 1 tablet (0.5 mg total) by mouth at bedtime as needed for anxiety.  Marland Kitchen atorvastatin (LIPITOR) 10 MG tablet TAKE (1) TABLET BY MOUTH EVERY DAY AT 6 IN THE EVENING  . cetirizine (ZYRTEC) 10 MG tablet Take 10 mg by mouth daily.  . cyclobenzaprine (FLEXERIL) 5 MG tablet Take 1 tablet (5 mg total) by mouth 3 (three) times daily as needed for muscle spasms.  Marland Kitchen desonide (DESOWEN) 0.05 % cream APPLY A SMALL AMOUNT TO SKIN TWICE DAILYAS NEEDED  . estradiol (VIVELLE-DOT) 0.0375 MG/24HR APPLY 1 PATCH EXTERNALLY TO THE SKIN 2 TIMES A WEEK  . fluticasone (FLONASE) 50 MCG/ACT nasal spray Place 2 sprays into both nostrils daily.  . metoprolol tartrate (LOPRESSOR) 25 MG tablet TAKE 1/2 TABLET BY MOUTH TWICE DAILY  . Multiple Vitamin (MULTIVITAMIN) tablet Take 1 tablet by mouth daily.  . naproxen sodium (ANAPROX) 550 MG tablet Take 1 tablet (550 mg total) by mouth 2 (two) times daily with a meal.  . omeprazole  (PRILOSEC) 20 MG capsule Take 1 capsule (20 mg total) by mouth daily.  . rizatriptan (MAXALT) 5 MG tablet Take 1 tablet (5 mg total) by mouth as needed for migraine.  . valACYclovir (VALTREX) 1000 MG tablet Take 1 tablet (1,000 mg total) by mouth as needed.  . valsartan-hydrochlorothiazide (DIOVAN-HCT) 80-12.5 MG tablet Take one tablet by mouth daily  . venlafaxine XR (EFFEXOR-XR) 37.5 MG 24 hr capsule TAKE (1) CAPSULE BY MOUTH EVERY DAY WITHBREAKFAST    Allergies:   Claritin-d 12 hour [loratadine-pseudoephedrine er] and Sulfa antibiotics   Social History   Socioeconomic History  . Marital status: Divorced    Spouse name: Not on file  . Number of children: 0  . Years of education: Not on file  . Highest education level: Not on file  Occupational History  . Not on file  Tobacco Use  . Smoking status: Never Smoker  . Smokeless tobacco: Never Used  Vaping Use  . Vaping Use: Never used  Substance and Sexual Activity  . Alcohol use: Yes    Comment: occassional  . Drug use: No  . Sexual activity: Yes    Partners: Male    Birth control/protection: Surgical  Other Topics Concern  . Not on file  Social History Narrative  . Not on file   Social Determinants of Health   Financial Resource Strain: Not on file  Food Insecurity: Not on file  Transportation Needs: Not on file  Physical Activity: Not on file  Stress: Not on file  Social Connections: Not on file     Family History:  The patient's family history includes Diabetes in her father; Heart attack in her father; Heart disease in her father; Heart failure in her father; Hyperlipidemia in her brother and mother; Hypertension in her father; Lung cancer in her mother. There is no history of Breast cancer.  ROS:   Review of Systems  Constitutional: Negative for chills, diaphoresis, fever, malaise/fatigue and weight loss.  HENT: Negative for congestion.   Eyes: Negative for discharge and redness.  Respiratory: Negative for  cough, sputum production, shortness of breath and wheezing.   Cardiovascular: Negative for chest pain, palpitations, orthopnea, claudication, leg swelling and PND.  Gastrointestinal: Negative for abdominal pain, heartburn, nausea and vomiting.  Musculoskeletal: Negative for falls and myalgias.  Skin: Negative for rash.  Neurological: Negative for dizziness, tingling, tremors, sensory change, speech change, focal weakness, loss of consciousness and weakness.  Endo/Heme/Allergies: Does not bruise/bleed easily.  Psychiatric/Behavioral: Negative for substance abuse. The patient is not nervous/anxious.   All other systems reviewed and are negative.    EKGs/Labs/Other Studies Reviewed:    Studies reviewed were summarized above. The additional studies were reviewed today:  Coronary CTA 10/25/2020: Aorta: Normal size. Minimal aortic root calcifications. No dissection.  Aortic Valve:  Trileaflet.  No calcifications.  Coronary Arteries:  Normal coronary origin.  Right dominance.  RCA is a large dominant artery that gives rise to PDA and PLA. There is no plaque.  Left main is a large artery that gives rise to LAD, Ramus and LCX arteries. There is no disease in the left main or Ramus arteries.  LAD has no plaque.  LCX is a non-dominant artery that gives rise to two obtuse marginal branches. There is no plaque.  Other findings:  Normal pulmonary vein drainage into the left atrium.  Normal left atrial appendage without a thrombus.  Normal size of the pulmonary artery.  IMPRESSION: 1. Coronary calcium score of 0. Patient is low risk for coronary events.  2. Normal coronary origin with right dominance.  3. No evidence of CAD.  4. CAD-RADS 0. No evidence of CAD (0%). Consider non-atherosclerotic causes of chest pain. __________  2D echo (outside office) 04/2020: EF 60 to 65%, normal wall motion, diastolic dysfunction, normal RV systolic function and ventricular  cavity size, mild mitral regurgitation, trace tricuspid regurgitation __________  ETT 10/2018:  Blood pressure demonstrated a normal response to exercise.  There was no ST segment deviation noted during stress.  No T wave inversion was noted during stress.  Normal treadmill stress test with no evidence of ischemia. Average exercise capacity with an exercise duration of 7 minutes and 38 seconds achieving a workload of 9.5 METS. __________  Elwyn Reach patch-2 08/2018: Normal sinus rhythm with an average heart rate of 74 bpm. Rare PACs and PVCs. Overall burden is less than 1%. __________  Elwyn Reach patch-1 08/2018: Normal sinus rhythm with an average heart rate of 74 bpm. Isolated PACs. Occasional PVCs with an overall burden of 2.4%. The burden of PVCs might be overestimated due to artifacts. __________  2D echo 03/2018: - Left ventricle: The cavity size was normal. Wall thickness was  normal. Systolic  function was normal. The estimated ejection  fraction was in the range of 60% to 65%. Wall motion was normal;  there were no regional wall motion abnormalities. Left  ventricular diastolic function parameters were normal.  - Right ventricle: The cavity size was normal. Wall thickness was  at the upper limits of normal. Systolic function was normal.  - Pulmonary arteries: Systolic pressure could not be accurately  estimated.  __________  48-hour Holter (outside office) 03/2018: Sinus rhythm with an average heart rate of 70 bpm (range 41 to 118 bpm) with a 1% burden of PVCs and a less than 1% burden of PACs   EKG:  EKG is ordered today.  The EKG ordered today demonstrates NSR, 67 bpm, no acute ST-T changes  Recent Labs: 03/02/2020: Magnesium 2.0 05/18/2020: TSH 1.030 09/28/2020: ALT 32; BUN 25; Creatinine, Ser 0.84; Hemoglobin 14.0; Platelets 233; Potassium 4.3; Sodium 138  Recent Lipid Panel    Component Value Date/Time   CHOL 149 05/18/2020 0828   TRIG 118 05/18/2020 0828    HDL 55 05/18/2020 0828   LDLCALC 73 05/18/2020 0828    PHYSICAL EXAM:    VS:  BP 110/70 (BP Location: Left Arm, Patient Position: Sitting, Cuff Size: Normal)   Pulse 68   Ht 5\' 10"  (1.778 m)   Wt 166 lb 2 oz (75.4 kg)   BMI 23.84 kg/m   BMI: Body mass index is 23.84 kg/m.  Physical Exam Vitals reviewed.  Constitutional:      Appearance: She is well-developed.  HENT:     Head: Normocephalic and atraumatic.  Eyes:     General:        Right eye: No discharge.        Left eye: No discharge.  Neck:     Vascular: No JVD.  Cardiovascular:     Rate and Rhythm: Normal rate and regular rhythm.     Pulses: No midsystolic click and no opening snap.          Posterior tibial pulses are 2+ on the right side and 2+ on the left side.     Heart sounds: Normal heart sounds, S1 normal and S2 normal. Heart sounds not distant. No murmur heard. No friction rub.  Pulmonary:     Effort: Pulmonary effort is normal. No respiratory distress.     Breath sounds: Normal breath sounds. No decreased breath sounds, wheezing or rales.  Chest:     Chest wall: No tenderness.  Abdominal:     General: There is no distension.     Palpations: Abdomen is soft.     Tenderness: There is no abdominal tenderness.  Musculoskeletal:     Cervical back: Normal range of motion.  Skin:    General: Skin is warm and dry.     Nails: There is no clubbing.  Neurological:     Mental Status: She is alert and oriented to person, place, and time.  Psychiatric:        Speech: Speech normal.        Behavior: Behavior normal.        Thought Content: Thought content normal.        Judgment: Judgment normal.     Wt Readings from Last 3 Encounters:  12/14/20 166 lb 2 oz (75.4 kg)  11/27/20 167 lb 6.4 oz (75.9 kg)  10/16/20 164 lb (74.4 kg)     ASSESSMENT & PLAN:   1. Chest pain: No further symptoms.  Coronary CTA with a calcium score  of 0 and no evidence of CAD.  Symptoms likely noncardiac in etiology.  No further  cardiac testing indicated at this time.  2. Palpitations/PVCs: Quiescent.  Continue Lopressor.  TSH normal.  Recent lytes at goal.  3. HTN: Blood pressure is well controlled in the office.  Continue Lopressor and Diovan HCT.  Recommend resumption of regular exercise program.  4. Diastolic dysfunction: Not requiring a standing loop diuretic.  Euvolemic and well compensated.  Continue optimal blood pressure control.  5. HLD: LDL 73 from 04/2020.  She remains on atorvastatin.  Disposition: F/u with Dr. Fletcher Anon or an APP in 12 months, sooner if needed.   Medication Adjustments/Labs and Tests Ordered: Current medicines are reviewed at length with the patient today.  Concerns regarding medicines are outlined above. Medication changes, Labs and Tests ordered today are summarized above and listed in the Patient Instructions accessible in Encounters.   Signed, Christell Faith, PA-C 12/14/2020 4:10 PM     [address] [place], [place] [address] [phone]

## 2020-12-14 ENCOUNTER — Encounter: Payer: Self-pay | Admitting: Physician Assistant

## 2020-12-14 ENCOUNTER — Other Ambulatory Visit: Payer: Self-pay

## 2020-12-14 ENCOUNTER — Ambulatory Visit: Payer: BC Managed Care – PPO | Admitting: Physician Assistant

## 2020-12-14 VITALS — BP 110/70 | HR 68 | Ht 70.0 in | Wt 166.1 lb

## 2020-12-14 DIAGNOSIS — R072 Precordial pain: Secondary | ICD-10-CM

## 2020-12-14 DIAGNOSIS — I10 Essential (primary) hypertension: Secondary | ICD-10-CM | POA: Diagnosis not present

## 2020-12-14 DIAGNOSIS — R002 Palpitations: Secondary | ICD-10-CM | POA: Diagnosis not present

## 2020-12-14 DIAGNOSIS — I493 Ventricular premature depolarization: Secondary | ICD-10-CM

## 2020-12-14 DIAGNOSIS — E782 Mixed hyperlipidemia: Secondary | ICD-10-CM

## 2020-12-14 NOTE — Patient Instructions (Signed)
Medication Instructions: Your physician recommends that you continue on your current medications as directed. Please refer to the Current Medication list given to you today.  *If you need a refill on your cardiac medications before your next appointment, please call your pharmacy*   Lab Work: NONE   If you have labs (blood work) drawn today and your tests are completely normal, you will receive your results only by: Marland Kitchen MyChart Message (if you have MyChart) OR . A paper copy in the mail If you have any lab test that is abnormal or we need to change your treatment, we will call you to review the results.   Testing/Procedures: NONE    Follow-Up: At [facility], you and your health needs are our priority.  As part of our continuing mission to provide you with exceptional heart care, we have created designated Provider Care Teams.  These Care Teams include your primary Cardiologist (physician) and Advanced Practice Providers (APPs -  Physician Assistants and Nurse Practitioners) who all work together to provide you with the care you need, when you need it.  We recommend signing up for the patient portal called "MyChart".  Sign up information is provided on this After Visit Summary.  MyChart is used to connect with patients for Virtual Visits (Telemedicine).  Patients are able to view lab/test results, encounter notes, upcoming appointments, etc.  Non-urgent messages can be sent to your provider as well.   To learn more about what you can do with MyChart, go to [email or web address].    Your next appointment:   12 month(s)  The format for your next appointment:   In Person  Provider:   You may see Kathlyn Sacramento, MD or one of the following Advanced Practice Providers on your designated Care Team:    Murray Hodgkins, NP  Christell Faith, PA-C  Marrianne Mood, PA-C  Cadence Winslow, [place]  Laurann Montana, NP

## 2020-12-18 ENCOUNTER — Other Ambulatory Visit: Payer: Self-pay | Admitting: Hospice and Palliative Medicine

## 2020-12-18 DIAGNOSIS — F411 Generalized anxiety disorder: Secondary | ICD-10-CM

## 2020-12-31 DIAGNOSIS — M9905 Segmental and somatic dysfunction of pelvic region: Secondary | ICD-10-CM | POA: Diagnosis not present

## 2021-01-02 ENCOUNTER — Other Ambulatory Visit: Payer: Self-pay | Admitting: Adult Health

## 2021-01-02 DIAGNOSIS — B009 Herpesviral infection, unspecified: Secondary | ICD-10-CM

## 2021-01-02 DIAGNOSIS — G43809 Other migraine, not intractable, without status migrainosus: Secondary | ICD-10-CM

## 2021-01-05 ENCOUNTER — Other Ambulatory Visit: Payer: Self-pay | Admitting: Internal Medicine

## 2021-01-05 ENCOUNTER — Encounter: Payer: Self-pay | Admitting: Internal Medicine

## 2021-01-05 DIAGNOSIS — G43809 Other migraine, not intractable, without status migrainosus: Secondary | ICD-10-CM

## 2021-01-05 DIAGNOSIS — B009 Herpesviral infection, unspecified: Secondary | ICD-10-CM

## 2021-01-05 MED ORDER — NAPROXEN SODIUM 550 MG PO TABS: 550.0000 mg | ORAL_TABLET | Freq: Two times a day (BID) | ORAL | 2 refills | Status: DC

## 2021-01-05 MED ORDER — VALACYCLOVIR HCL 1 G PO TABS: 1000.0000 mg | ORAL_TABLET | ORAL | 2 refills | Status: DC | PRN

## 2021-01-17 ENCOUNTER — Other Ambulatory Visit: Payer: Self-pay

## 2021-01-17 DIAGNOSIS — F411 Generalized anxiety disorder: Secondary | ICD-10-CM

## 2021-01-17 MED ORDER — VENLAFAXINE HCL ER 37.5 MG PO CP24: ORAL_CAPSULE | ORAL | 3 refills | Status: DC

## 2021-01-18 DIAGNOSIS — M47812 Spondylosis without myelopathy or radiculopathy, cervical region: Secondary | ICD-10-CM | POA: Diagnosis not present

## 2021-01-18 DIAGNOSIS — M542 Cervicalgia: Secondary | ICD-10-CM | POA: Diagnosis not present

## 2021-01-18 DIAGNOSIS — Z9889 Other specified postprocedural states: Secondary | ICD-10-CM | POA: Diagnosis not present

## 2021-01-22 ENCOUNTER — Other Ambulatory Visit: Payer: Self-pay

## 2021-01-22 MED ORDER — DESONIDE 0.05 % EX CREA: TOPICAL_CREAM | CUTANEOUS | 3 refills | Status: DC

## 2021-01-28 DIAGNOSIS — M9905 Segmental and somatic dysfunction of pelvic region: Secondary | ICD-10-CM | POA: Diagnosis not present

## 2021-02-11 ENCOUNTER — Encounter: Payer: Self-pay | Admitting: Obstetrics and Gynecology

## 2021-02-11 ENCOUNTER — Ambulatory Visit (INDEPENDENT_AMBULATORY_CARE_PROVIDER_SITE_OTHER): Payer: BC Managed Care – PPO | Admitting: Obstetrics and Gynecology

## 2021-02-11 ENCOUNTER — Other Ambulatory Visit: Payer: Self-pay

## 2021-02-11 VITALS — BP 126/78 | HR 72 | Ht 70.0 in | Wt 166.0 lb

## 2021-02-11 DIAGNOSIS — Z01419 Encounter for gynecological examination (general) (routine) without abnormal findings: Secondary | ICD-10-CM | POA: Diagnosis not present

## 2021-02-11 DIAGNOSIS — Z1239 Encounter for other screening for malignant neoplasm of breast: Secondary | ICD-10-CM | POA: Diagnosis not present

## 2021-02-11 NOTE — Patient Instructions (Signed)
[facility] [address] [place] [address]  [facility]  [address] [place] [address]  Phone: [phone]  

## 2021-02-11 NOTE — Progress Notes (Signed)
Gynecology Annual Exam  PCP: Lavera Guise, MD  Chief Complaint:  Chief Complaint  Patient presents with   Gynecologic Exam    Annual - no concerns. RM 5    History of Present Illness:Patient is a 55 y.o. G1P0010 presents for annual exam. The patient has no complaints today.   LMP: No LMP recorded. Patient has had a hysterectomy.  The patient is sexually active. She denies dyspareunia.  The patient does perform self breast exams.  There is no notable family history of breast or ovarian cancer in her family.  The patient wears seatbelts: yes.   The patient has regular exercise: not asked.    The patient denies current symptoms of depression.     Review of Systems: Review of Systems  Constitutional:  Negative for chills and fever.  HENT:  Negative for congestion.   Respiratory:  Negative for cough and shortness of breath.   Cardiovascular:  Negative for chest pain and palpitations.  Gastrointestinal:  Negative for abdominal pain, constipation, diarrhea, heartburn, nausea and vomiting.  Genitourinary:  Negative for dysuria, frequency and urgency.  Skin:  Negative for itching and rash.  Neurological:  Negative for dizziness and headaches.  Endo/Heme/Allergies:  Negative for polydipsia.  Psychiatric/Behavioral:  Negative for depression.    Past Medical History:  Patient Active Problem List   Diagnosis Date Noted   Anxiety 01/25/2019   Palpitations 01/25/2019   Hypertension    Hyperlipidemia    GERD (gastroesophageal reflux disease)    Cervical cancer, FIGO stage IB1 ([place]) 01/14/2017    S/p radical TAH 2005 by Dr Clarene Essex at [facility]     Cervical stenosis of spine 06/11/2016   Lump or mass in breast 05/30/2013    Past Surgical History:  Past Surgical History:  Procedure Laterality Date   ABDOMINAL HYSTERECTOMY  2005   Radical hysterectomy by Dr Clarene Essex at [facility] cervical cancer 1B1   BREAST CYST ASPIRATION Left 05/30/2013   CERVICAL DISC ARTHROPLASTY Right  06/11/2016   Procedure: CERVICAL ANTERIOR [place] 5-7;  Surgeon: Blanche East, MD;  Location: [facility] ORS;  Service: Neurosurgery;  Laterality: Right;   DIAGNOSTIC LAPAROSCOPY  2005   DILATION AND CURETTAGE OF UTERUS     SAB   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    NASAL SINUS SURGERY      Gynecologic History:  No LMP recorded. Patient has had a hysterectomy. Last Pap: Results were: 6/15/2021NIL and HR HPV negative  Last mammogram: 06/06/2020 Results were: BI-RAD I  Obstetric History: G1P0010  Family History:  Family History  Problem Relation Age of Onset   Lung cancer Mother    Hyperlipidemia Mother    Diabetes Father    Hypertension Father    Heart failure Father    Heart attack Father    Heart disease Father    Hyperlipidemia Brother    Breast cancer Neg Hx     Social History:  Social History   Socioeconomic History   Marital status: Divorced    Spouse name: Not on file   Number of children: 0   Years of education: Not on file   Highest education level: Not on file  Occupational History   Not on file  Tobacco Use   Smoking status: Never   Smokeless tobacco: Never  Vaping Use   Vaping Use: Never used  Substance and Sexual Activity   Alcohol use: Yes    Comment: occassional   Drug  use: No   Sexual activity: Yes    Partners: Male    Birth control/protection: Surgical  Other Topics Concern   Not on file  Social History Narrative   Not on file   Social Determinants of Health   Financial Resource Strain: Not on file  Food Insecurity: Not on file  Transportation Needs: Not on file  Physical Activity: Not on file  Stress: Not on file  Social Connections: Not on file  Intimate Partner Violence: Not on file    Allergies:  Allergies  Allergen Reactions   Claritin-D 12 Hour [Loratadine-Pseudoephedrine Er] Itching and Swelling   Sulfa Antibiotics Itching and Swelling    Medications: Prior to  Admission medications   Medication Sig Start Date End Date Taking? Authorizing Provider  atorvastatin (LIPITOR) 10 MG tablet TAKE (1) TABLET BY MOUTH EVERY DAY AT 6 IN THE EVENING 11/19/20  Yes Luiz Ochoa, NP  cetirizine (ZYRTEC) 10 MG tablet Take 10 mg by mouth daily.   Yes [provider]  desonide (DESOWEN) 0.05 % cream APPLY A SMALL AMOUNT TO SKIN TWICE DAILYAS NEEDED 01/22/21  Yes Lavera Guise, MD  estradiol (VIVELLE-DOT) 0.0375 MG/24HR APPLY 1 PATCH EXTERNALLY TO THE SKIN 2 TIMES A WEEK 04/04/20  Yes Dalia Heading, CNM  metoprolol tartrate (LOPRESSOR) 25 MG tablet TAKE 1/2 TABLET BY MOUTH TWICE DAILY 07/23/20  Yes Rise Mu, PA-C  Multiple Vitamin (MULTIVITAMIN) tablet Take 1 tablet by mouth daily.   Yes [provider]  naproxen sodium (ANAPROX) 550 MG tablet Take 1 tablet (550 mg total) by mouth 2 (two) times daily with a meal. 01/05/21  Yes Lavera Guise, MD  omeprazole (PRILOSEC) 20 MG capsule Take 1 capsule (20 mg total) by mouth daily. 03/26/18  Yes Kendell Bane, NP  pregabalin (LYRICA) 75 MG capsule Take by mouth. 01/18/21  Yes [provider]  valsartan-hydrochlorothiazide (DIOVAN-HCT) 80-12.5 MG tablet Take one tablet by mouth daily 10/26/20  Yes Lavera Guise, MD  venlafaxine XR (EFFEXOR-XR) 37.5 MG 24 hr capsule Take 1 capsule by mouth daily with breakfast 01/17/21  Yes Lavera Guise, MD  ALPRAZolam Duanne Moron) 0.5 MG tablet Take 1 tablet (0.5 mg total) by mouth at bedtime as needed for anxiety. 11/01/19   Kendell Bane, NP  fluticasone (FLONASE) 50 MCG/ACT nasal spray Place 2 sprays into both nostrils daily. 07/15/19   Melynda Ripple, MD  rizatriptan (MAXALT) 5 MG tablet Take 1 tablet (5 mg total) by mouth as needed for migraine. 11/01/19   Kendell Bane, NP  valACYclovir (VALTREX) 1000 MG tablet Take 1 tablet (1,000 mg total) by mouth as needed. 01/05/21   Lavera Guise, MD    Physical Exam Vitals: Blood pressure 126/78, pulse 72, height 5'  10" (1.778 m), weight 166 lb (75.3 kg).  General: NAD HEENT: normocephalic, anicteric Thyroid: no enlargement, no palpable nodules Pulmonary: No increased work of breathing, CTAB Cardiovascular: RRR, distal pulses 2+ Breast: Breast symmetrical, no tenderness, no palpable nodules or masses, no skin or nipple retraction present, no nipple discharge.  No axillary or supraclavicular lymphadenopathy. Abdomen: NABS, soft, non-tender, non-distended.  Umbilicus without lesions.  No hepatomegaly, splenomegaly or masses palpable. No evidence of hernia  Genitourinary:  External: Normal external female genitalia.  Normal urethral meatus, normal Bartholin's and Skene's glands.    Vagina: Normal vaginal mucosa, no evidence of prolapse.    Cervix: Grossly normal in appearance, no bleeding  Uterus: Non-enlarged, mobile, normal contour.  No CMT  Adnexa: ovaries non-enlarged, no adnexal masses  Rectal: deferred  Lymphatic: no evidence of inguinal lymphadenopathy Extremities: no edema, erythema, or tenderness Neurologic: Grossly intact Psychiatric: mood appropriate, affect full  Female chaperone present for pelvic and breast  portions of the physical exam     Assessment: 55 y.o. G1P0010 routine annual exam  Plan: Problem List Items Addressed This Visit   None   1) Mammogram - recommend yearly screening mammogram.  Mammogram Is up to date  2) STI screening  was notoffered and therefore not obtained  3) ASCCP guidelines and rational discussed.  Patient opts for discontinue secondary to prior hysterectomy screening interval  4) Osteoporosis  - per USPTF routine screening DEXA at age 20  5) Routine healthcare maintenance including cholesterol, diabetes screening discussed managed by PCP  6) No follow-ups on file.    Malachy Mood, MD Mosetta Pigeon, [facility] 02/11/2021, 4:07 PM

## 2021-03-01 DIAGNOSIS — M9905 Segmental and somatic dysfunction of pelvic region: Secondary | ICD-10-CM | POA: Diagnosis not present

## 2021-03-28 ENCOUNTER — Other Ambulatory Visit: Payer: Self-pay

## 2021-03-28 ENCOUNTER — Ambulatory Visit: Payer: BC Managed Care – PPO | Admitting: Physician Assistant

## 2021-03-28 ENCOUNTER — Encounter: Payer: Self-pay | Admitting: Physician Assistant

## 2021-03-28 DIAGNOSIS — G43809 Other migraine, not intractable, without status migrainosus: Secondary | ICD-10-CM | POA: Diagnosis not present

## 2021-03-28 DIAGNOSIS — E782 Mixed hyperlipidemia: Secondary | ICD-10-CM | POA: Diagnosis not present

## 2021-03-28 DIAGNOSIS — I10 Essential (primary) hypertension: Secondary | ICD-10-CM

## 2021-03-28 DIAGNOSIS — F411 Generalized anxiety disorder: Secondary | ICD-10-CM | POA: Diagnosis not present

## 2021-03-28 DIAGNOSIS — E039 Hypothyroidism, unspecified: Secondary | ICD-10-CM

## 2021-03-28 NOTE — Progress Notes (Signed)
[facility] [place], [place] [address]  Internal MEDICINE  Office Visit Note  Patient Name: Laura Barber  [id number]  [id number]  Date of Service: 03/31/2021  Chief Complaint  Patient presents with   Follow-up         HPI Pt is here for routine follow up and has no complaints today -Anxiety has been ok. Has only taken 1/2 xanax once in a long time. -Started the effexor for migraines originally but helps with anxiety. Migraines well controlled -Sleeping well -Pt forgot to have additional labs done last visit and will go now--new orders placed -BP stable  Current Medication: Outpatient Encounter Medications as of 03/28/2021  Medication Sig Note   ALPRAZolam (XANAX) 0.5 MG tablet Take 1 tablet (0.5 mg total) by mouth at bedtime as needed for anxiety. 02/11/2021: As needed   atorvastatin (LIPITOR) 10 MG tablet TAKE (1) TABLET BY MOUTH EVERY DAY AT 6 IN THE EVENING    cetirizine (ZYRTEC) 10 MG tablet Take 10 mg by mouth daily.    desonide (DESOWEN) 0.05 % cream APPLY A SMALL AMOUNT TO SKIN TWICE DAILYAS NEEDED    estradiol (VIVELLE-DOT) 0.0375 MG/24HR APPLY 1 PATCH EXTERNALLY TO THE SKIN 2 TIMES A WEEK    fluticasone (FLONASE) 50 MCG/ACT nasal spray Place 2 sprays into both nostrils daily.    metoprolol tartrate (LOPRESSOR) 25 MG tablet TAKE 1/2 TABLET BY MOUTH TWICE DAILY    Multiple Vitamin (MULTIVITAMIN) tablet Take 1 tablet by mouth daily.    naproxen sodium (ANAPROX) 550 MG tablet Take 1 tablet (550 mg total) by mouth 2 (two) times daily with a meal.    omeprazole (PRILOSEC) 20 MG capsule Take 1 capsule (20 mg total) by mouth daily.    pregabalin (LYRICA) 75 MG capsule Take by mouth.    rizatriptan (MAXALT) 5 MG tablet Take 1 tablet (5 mg total) by mouth as needed for migraine.    valACYclovir (VALTREX) 1000 MG tablet Take 1 tablet (1,000 mg total) by mouth as needed.    valsartan-hydrochlorothiazide (DIOVAN-HCT) 80-12.5 MG tablet Take one tablet by  mouth daily    venlafaxine XR (EFFEXOR-XR) 37.5 MG 24 hr capsule Take 1 capsule by mouth daily with breakfast    No facility-administered encounter medications on file as of 03/28/2021.    Surgical History: Past Surgical History:  Procedure Laterality Date   ABDOMINAL HYSTERECTOMY  2005   Radical hysterectomy by Dr Clarene Essex at [facility] cervical cancer 1B1   BREAST CYST ASPIRATION Left 05/30/2013   CERVICAL DISC ARTHROPLASTY Right 06/11/2016   Procedure: CERVICAL ANTERIOR [place] CERVICAL 5-7;  Surgeon: Blanche East, MD;  Location: [facility] ORS;  Service: Neurosurgery;  Laterality: Right;   DIAGNOSTIC LAPAROSCOPY  2005   DILATION AND CURETTAGE OF UTERUS     SAB   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    NASAL SINUS SURGERY      Medical History: Past Medical History:  Diagnosis Date   Anxiety    Cancer ([place])    cervical   Cervical cancer, FIGO stage IB1 ([place]) 2005   Cervical   GERD (gastroesophageal reflux disease)    Headache    Hyperlipidemia    Hypertension    Migraine    Shingles 2019    Family History: Family History  Problem Relation Age of Onset   Lung cancer Mother    Hyperlipidemia Mother    Diabetes Father    Hypertension Father  Heart failure Father    Heart attack Father    Heart disease Father    Hyperlipidemia Brother    Breast cancer Neg Hx     Social History   Socioeconomic History   Marital status: Divorced    Spouse name: Not on file   Number of children: 0   Years of education: Not on file   Highest education level: Not on file  Occupational History   Not on file  Tobacco Use   Smoking status: Never   Smokeless tobacco: Never  Vaping Use   Vaping Use: Never used  Substance and Sexual Activity   Alcohol use: Yes    Comment: occassional   Drug use: No   Sexual activity: Yes    Partners: Male    Birth control/protection: Surgical  Other Topics Concern   Not on file  Social History  Narrative   Not on file   Social Determinants of Health   Financial Resource Strain: Not on file  Food Insecurity: Not on file  Transportation Needs: Not on file  Physical Activity: Not on file  Stress: Not on file  Social Connections: Not on file  Intimate Partner Violence: Not on file      Review of Systems  Constitutional:  Negative for chills, fatigue and unexpected weight change.  HENT:  Negative for congestion, postnasal drip, rhinorrhea, sneezing and sore throat.   Eyes:  Negative for redness.  Respiratory:  Negative for cough, chest tightness and shortness of breath.   Cardiovascular:  Negative for chest pain and palpitations.  Gastrointestinal:  Negative for abdominal pain, constipation, diarrhea, nausea and vomiting.  Genitourinary:  Negative for dysuria and frequency.  Musculoskeletal:  Negative for arthralgias, back pain, joint swelling and neck pain.  Skin:  Negative for rash.  Neurological: Negative.  Negative for tremors and numbness.  Hematological:  Negative for adenopathy. Does not bruise/bleed easily.  Psychiatric/Behavioral:  Negative for behavioral problems (Depression), sleep disturbance and suicidal ideas. The patient is not nervous/anxious.    Vital Signs: BP 132/84   Pulse 77   Temp 98.1 F (36.7 C)   Resp 16   Ht '5\' 10"'$  (1.778 m)   Wt 169 lb 6.4 oz (76.8 kg)   SpO2 98%   BMI 24.31 kg/m    Physical Exam Vitals and nursing note reviewed.  Constitutional:      General: She is not in acute distress.    Appearance: She is well-developed and normal weight. She is not diaphoretic.  HENT:     Head: Normocephalic and atraumatic.     Mouth/Throat:     Pharynx: No oropharyngeal exudate.  Eyes:     Pupils: Pupils are equal, round, and reactive to light.  Neck:     Thyroid: No thyromegaly.     Vascular: No JVD.     Trachea: No tracheal deviation.  Cardiovascular:     Rate and Rhythm: Normal rate and regular rhythm.     Heart sounds: Normal  heart sounds. No murmur heard.   No friction rub. No gallop.  Pulmonary:     Effort: Pulmonary effort is normal. No respiratory distress.     Breath sounds: No wheezing or rales.  Chest:     Chest wall: No tenderness.  Abdominal:     General: Bowel sounds are normal.     Palpations: Abdomen is soft.  Musculoskeletal:        General: Normal range of motion.     Cervical back: Normal range  of motion and neck supple.  Lymphadenopathy:     Cervical: No cervical adenopathy.  Skin:    General: Skin is warm and dry.  Neurological:     Mental Status: She is alert and oriented to person, place, and time.     Cranial Nerves: No cranial nerve deficit.  Psychiatric:        Behavior: Behavior normal.        Thought Content: Thought content normal.        Judgment: Judgment normal.       Assessment/Plan: 1. Generalized anxiety disorder Stable, continue effexor and xanax only as needed  2. Other migraine without status migrainosus, not intractable Continue effexor for prevention  3. Essential hypertension Stable, continue current medications  4. Mixed hyperlipidemia Will update labs - Lipid Panel With LDL/HDL Ratio  5. Hypothyroidism, unspecified type Will update labs - TSH + free T4   General Counseling: Matraca verbalizes understanding of the findings of todays visit and agrees with plan of treatment. I have discussed any further diagnostic evaluation that may be needed or ordered today. We also reviewed her medications today. she has been encouraged to call the office with any questions or concerns that should arise related to todays visit.    Orders Placed This Encounter  Procedures   [facility]   Lipid Panel With LDL/HDL Ratio   TSH + free T4    No orders of the defined types were placed in this encounter.   This patient was seen by Drema Dallas, PA-C in collaboration with Dr. Clayborn Bigness as a part of collaborative care agreement.   Total time spent:35  Minutes Time spent includes review of chart, medications, test results, and follow up plan with the patient.      Dr Lavera Guise Internal medicine

## 2021-03-29 DIAGNOSIS — M9905 Segmental and somatic dysfunction of pelvic region: Secondary | ICD-10-CM | POA: Diagnosis not present

## 2021-04-01 ENCOUNTER — Encounter: Payer: Self-pay | Admitting: Internal Medicine

## 2021-04-02 DIAGNOSIS — M545 Low back pain, unspecified: Secondary | ICD-10-CM | POA: Diagnosis not present

## 2021-04-02 DIAGNOSIS — M7061 Trochanteric bursitis, right hip: Secondary | ICD-10-CM | POA: Diagnosis not present

## 2021-04-23 DIAGNOSIS — M9905 Segmental and somatic dysfunction of pelvic region: Secondary | ICD-10-CM | POA: Diagnosis not present

## 2021-05-01 ENCOUNTER — Other Ambulatory Visit: Payer: Self-pay

## 2021-05-01 DIAGNOSIS — I10 Essential (primary) hypertension: Secondary | ICD-10-CM

## 2021-05-01 MED ORDER — VALSARTAN-HYDROCHLOROTHIAZIDE 80-12.5 MG PO TABS: ORAL_TABLET | ORAL | 1 refills | Status: DC

## 2021-05-21 ENCOUNTER — Other Ambulatory Visit: Payer: Self-pay

## 2021-05-21 ENCOUNTER — Encounter: Payer: Self-pay | Admitting: Physician Assistant

## 2021-05-21 DIAGNOSIS — F411 Generalized anxiety disorder: Secondary | ICD-10-CM

## 2021-05-21 MED ORDER — VENLAFAXINE HCL ER 37.5 MG PO CP24: ORAL_CAPSULE | ORAL | 3 refills | Status: DC

## 2021-05-22 DIAGNOSIS — M9905 Segmental and somatic dysfunction of pelvic region: Secondary | ICD-10-CM | POA: Diagnosis not present

## 2021-05-27 DIAGNOSIS — M7061 Trochanteric bursitis, right hip: Secondary | ICD-10-CM | POA: Diagnosis not present

## 2021-05-27 DIAGNOSIS — M545 Low back pain, unspecified: Secondary | ICD-10-CM | POA: Diagnosis not present

## 2021-05-30 ENCOUNTER — Other Ambulatory Visit: Payer: Self-pay | Admitting: Obstetrics and Gynecology

## 2021-05-30 MED ORDER — ESTRADIOL 0.0375 MG/24HR TD PTTW: 1.0000 | MEDICATED_PATCH | TRANSDERMAL | 3 refills | Status: DC

## 2021-06-10 ENCOUNTER — Ambulatory Visit
Admission: RE | Admit: 2021-06-10 | Discharge: 2021-06-10 | Disposition: A | Discharge status: Home / Self Care | Payer: BC Managed Care – PPO | Source: Ambulatory Visit | Attending: Obstetrics and Gynecology | Admitting: Obstetrics and Gynecology

## 2021-06-10 ENCOUNTER — Other Ambulatory Visit: Payer: Self-pay

## 2021-06-10 DIAGNOSIS — Z1239 Encounter for other screening for malignant neoplasm of breast: Secondary | ICD-10-CM

## 2021-06-10 DIAGNOSIS — Z1231 Encounter for screening mammogram for malignant neoplasm of breast: Secondary | ICD-10-CM | POA: Insufficient documentation

## 2021-06-19 ENCOUNTER — Encounter: Payer: Self-pay | Admitting: Physician Assistant

## 2021-06-19 ENCOUNTER — Other Ambulatory Visit: Payer: Self-pay

## 2021-06-19 DIAGNOSIS — E785 Hyperlipidemia, unspecified: Secondary | ICD-10-CM

## 2021-06-19 MED ORDER — ATORVASTATIN CALCIUM 10 MG PO TABS: ORAL_TABLET | ORAL | 1 refills | Status: DC

## 2021-06-20 DIAGNOSIS — M9905 Segmental and somatic dysfunction of pelvic region: Secondary | ICD-10-CM | POA: Diagnosis not present

## 2021-07-16 DIAGNOSIS — M9905 Segmental and somatic dysfunction of pelvic region: Secondary | ICD-10-CM | POA: Diagnosis not present

## 2021-07-26 DIAGNOSIS — E039 Hypothyroidism, unspecified: Secondary | ICD-10-CM | POA: Diagnosis not present

## 2021-07-26 DIAGNOSIS — E782 Mixed hyperlipidemia: Secondary | ICD-10-CM | POA: Diagnosis not present

## 2021-07-27 LAB — LIPID PANEL WITH LDL/HDL RATIO
Cholesterol, Total: 215 mg/dL — ABNORMAL HIGH (ref 100–199)
HDL: 85 mg/dL (ref >39)
LDL Chol Calc (NIH): 111 mg/dL — ABNORMAL HIGH (ref 0–99)
LDL/HDL Ratio: 1.3 ratio (ref 0.0–3.2)
Triglycerides: 109 mg/dL (ref 0–149)
VLDL Cholesterol Cal: 19 mg/dL (ref 5–40)

## 2021-07-27 LAB — TSH+FREE T4
Free T4: 1.24 ng/dL (ref 0.82–1.77)
TSH: 1.81 u[IU]/mL (ref 0.450–4.500)

## 2021-07-29 ENCOUNTER — Ambulatory Visit: Payer: BC Managed Care – PPO | Admitting: Physician Assistant

## 2021-07-29 ENCOUNTER — Encounter: Payer: Self-pay | Admitting: Physician Assistant

## 2021-07-29 ENCOUNTER — Other Ambulatory Visit: Payer: Self-pay

## 2021-07-29 DIAGNOSIS — F411 Generalized anxiety disorder: Secondary | ICD-10-CM

## 2021-07-29 DIAGNOSIS — E559 Vitamin D deficiency, unspecified: Secondary | ICD-10-CM

## 2021-07-29 DIAGNOSIS — R5383 Other fatigue: Secondary | ICD-10-CM

## 2021-07-29 DIAGNOSIS — I10 Essential (primary) hypertension: Secondary | ICD-10-CM

## 2021-07-29 DIAGNOSIS — E782 Mixed hyperlipidemia: Secondary | ICD-10-CM

## 2021-07-29 DIAGNOSIS — G43809 Other migraine, not intractable, without status migrainosus: Secondary | ICD-10-CM | POA: Diagnosis not present

## 2021-07-29 DIAGNOSIS — E538 Deficiency of other specified B group vitamins: Secondary | ICD-10-CM

## 2021-07-29 DIAGNOSIS — E039 Hypothyroidism, unspecified: Secondary | ICD-10-CM

## 2021-07-29 NOTE — Progress Notes (Signed)
[facility] [place], [place] [address]  Internal MEDICINE  Office Visit Note  Patient Name: Laura Barber  [id number]  [id number]  Date of Service: 07/29/2021  Chief Complaint  Patient presents with   Follow-up   Hyperlipidemia   Hypertension   Anxiety    HPI Pt is here for routine follow up -Labs reviewed, lipids a little elevated and thinks she needs to work on exercising more since diet has been pretty good and she continues to take lipitor. Will repeat labs at [facility] in April for monitoring. Thyroid in normal range -Migraines and anxiety well controlled -Sleeping well -Thinks she might have a cyst on left ovary. Sees OBGYN normally. Used to get these and this feels the same. Will monitor symptoms and if acute worsening will follow up with OBGYN if needed.  Current Medication: Outpatient Encounter Medications as of 07/29/2021  Medication Sig Note   ALPRAZolam (XANAX) 0.5 MG tablet Take 1 tablet (0.5 mg total) by mouth at bedtime as needed for anxiety. 02/11/2021: As needed   atorvastatin (LIPITOR) 10 MG tablet TAKE (1) TABLET BY MOUTH EVERY DAY AT 6 IN THE EVENING    cetirizine (ZYRTEC) 10 MG tablet Take 10 mg by mouth daily.    desonide (DESOWEN) 0.05 % cream APPLY A SMALL AMOUNT TO SKIN TWICE DAILYAS NEEDED    estradiol (VIVELLE-DOT) 0.0375 MG/24HR Place 1 patch onto the skin 2 (two) times a week.    metoprolol tartrate (LOPRESSOR) 25 MG tablet TAKE 1/2 TABLET BY MOUTH TWICE DAILY    Multiple Vitamin (MULTIVITAMIN) tablet Take 1 tablet by mouth daily.    naproxen sodium (ANAPROX) 550 MG tablet Take 1 tablet (550 mg total) by mouth 2 (two) times daily with a meal.    omeprazole (PRILOSEC) 20 MG capsule Take 1 capsule (20 mg total) by mouth daily.    rizatriptan (MAXALT) 5 MG tablet Take 1 tablet (5 mg total) by mouth as needed for migraine.    valACYclovir (VALTREX) 1000 MG tablet Take 1 tablet (1,000 mg total) by mouth as needed.     valsartan-hydrochlorothiazide (DIOVAN-HCT) 80-12.5 MG tablet Take one tablet by mouth daily    venlafaxine XR (EFFEXOR-XR) 37.5 MG 24 hr capsule Take 1 capsule by mouth daily with breakfast    [DISCONTINUED] fluticasone (FLONASE) 50 MCG/ACT nasal spray Place 2 sprays into both nostrils daily. (Patient not taking: Reported on 07/29/2021)    [DISCONTINUED] pregabalin (LYRICA) 75 MG capsule Take by mouth. (Patient not taking: Reported on 07/29/2021)    No facility-administered encounter medications on file as of 07/29/2021.    Surgical History: Past Surgical History:  Procedure Laterality Date   ABDOMINAL HYSTERECTOMY  2005   Radical hysterectomy by Dr Clarene Essex at [facility] cervical cancer 1B1   BREAST CYST ASPIRATION Left 05/30/2013   CERVICAL DISC ARTHROPLASTY Right 06/11/2016   Procedure: CERVICAL ANTERIOR [place] CERVICAL 5-7;  Surgeon: Blanche East, MD;  Location: [facility] ORS;  Service: Neurosurgery;  Laterality: Right;   DIAGNOSTIC LAPAROSCOPY  2005   DILATION AND CURETTAGE OF UTERUS     SAB   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    NASAL SINUS SURGERY      Medical History: Past Medical History:  Diagnosis Date   Anxiety    Cancer ([place])    cervical   Cervical cancer, FIGO stage IB1 ([place]) 2005   Cervical   GERD (gastroesophageal reflux disease)    Headache    Hyperlipidemia  Hypertension    Migraine    Shingles 2019    Family History: Family History  Problem Relation Age of Onset   Lung cancer Mother    Hyperlipidemia Mother    Diabetes Father    Hypertension Father    Heart failure Father    Heart attack Father    Heart disease Father    Hyperlipidemia Brother    Breast cancer Neg Hx     Social History   Socioeconomic History   Marital status: Divorced    Spouse name: Not on file   Number of children: 0   Years of education: Not on file   Highest education level: Not on file  Occupational History   Not on file   Tobacco Use   Smoking status: Never   Smokeless tobacco: Never  Vaping Use   Vaping Use: Never used  Substance and Sexual Activity   Alcohol use: Yes    Comment: occassional   Drug use: No   Sexual activity: Yes    Partners: Male    Birth control/protection: Surgical  Other Topics Concern   Not on file  Social History Narrative   Not on file   Social Determinants of Health   Financial Resource Strain: Not on file  Food Insecurity: Not on file  Transportation Needs: Not on file  Physical Activity: Not on file  Stress: Not on file  Social Connections: Not on file  Intimate Partner Violence: Not on file      Review of Systems  Constitutional:  Negative for chills, fatigue and unexpected weight change.  HENT:  Negative for congestion, postnasal drip, rhinorrhea, sneezing and sore throat.   Eyes:  Negative for redness.  Respiratory:  Negative for cough, chest tightness and shortness of breath.   Cardiovascular:  Negative for chest pain and palpitations.  Gastrointestinal:  Negative for abdominal pain, constipation, diarrhea, nausea and vomiting.  Genitourinary:  Positive for pelvic pain. Negative for dysuria and frequency.  Musculoskeletal:  Negative for arthralgias, back pain, joint swelling and neck pain.  Skin:  Negative for rash.  Neurological: Negative.  Negative for tremors and numbness.  Hematological:  Negative for adenopathy. Does not bruise/bleed easily.  Psychiatric/Behavioral:  Negative for behavioral problems (Depression), sleep disturbance and suicidal ideas. The patient is not nervous/anxious.    Vital Signs: BP 106/78   Pulse 71   Temp 98 F (36.7 C)   Resp 16   Ht 5\' 10"  (1.778 m)   Wt 177 lb 12.8 oz (80.6 kg)   SpO2 99%   BMI 25.51 kg/m    Physical Exam Vitals and nursing note reviewed.  Constitutional:      General: She is not in acute distress.    Appearance: She is well-developed and normal weight. She is not diaphoretic.  HENT:      Head: Normocephalic and atraumatic.     Mouth/Throat:     Pharynx: No oropharyngeal exudate.  Eyes:     Pupils: Pupils are equal, round, and reactive to light.  Neck:     Thyroid: No thyromegaly.     Vascular: No JVD.     Trachea: No tracheal deviation.  Cardiovascular:     Rate and Rhythm: Normal rate and regular rhythm.     Heart sounds: Normal heart sounds. No murmur heard.   No friction rub. No gallop.  Pulmonary:     Effort: Pulmonary effort is normal. No respiratory distress.     Breath sounds: No wheezing or rales.  Chest:     Chest wall: No tenderness.  Abdominal:     General: Bowel sounds are normal.     Palpations: Abdomen is soft.  Musculoskeletal:        General: Normal range of motion.     Cervical back: Normal range of motion and neck supple.  Lymphadenopathy:     Cervical: No cervical adenopathy.  Skin:    General: Skin is warm and dry.  Neurological:     Mental Status: She is alert and oriented to person, place, and time.     Cranial Nerves: No cranial nerve deficit.  Psychiatric:        Behavior: Behavior normal.        Thought Content: Thought content normal.        Judgment: Judgment normal.       Assessment/Plan: 1. Essential hypertension Well controlled, a little low in office--denies any symptoms. Will continue current medications  2. Generalized anxiety disorder Well controlled, continue effexor  3. Other migraine without status migrainosus, not intractable Controlled, continue effexor  4. Mixed hyperlipidemia Continue lipitor and will update labs before CPE - Lipid Panel With LDL/HDL Ratio  5. Hypothyroidism, unspecified type - TSH + free T4  6. Vitamin D deficiency - VITAMIN D 25 Hydroxy (Vit-D Deficiency, Fractures)  7. B12 deficiency - B12 and Folate Panel  8. Other fatigue - CBC w/Diff/Platelet - Comprehensive metabolic panel   General Counseling: Aariel verbalizes understanding of the findings of todays visit and agrees  with plan of treatment. I have discussed any further diagnostic evaluation that may be needed or ordered today. We also reviewed her medications today. she has been encouraged to call the office with any questions or concerns that should arise related to todays visit.    Orders Placed This Encounter  Procedures   CBC w/Diff/Platelet   Lipid Panel With LDL/HDL Ratio   TSH + free T4   VITAMIN D 25 Hydroxy (Vit-D Deficiency, Fractures)   B12 and Folate Panel   Comprehensive metabolic panel    No orders of the defined types were placed in this encounter.   This patient was seen by Drema Dallas, PA-C in collaboration with Dr. Clayborn Bigness as a part of collaborative care agreement.   Total time spent:30 Minutes Time spent includes review of chart, medications, test results, and follow up plan with the patient.      Dr Lavera Guise Internal medicine

## 2021-08-02 ENCOUNTER — Other Ambulatory Visit: Payer: Self-pay | Admitting: Physician Assistant

## 2021-08-30 DIAGNOSIS — M9905 Segmental and somatic dysfunction of pelvic region: Secondary | ICD-10-CM | POA: Diagnosis not present

## 2021-09-10 ENCOUNTER — Other Ambulatory Visit: Payer: Self-pay | Admitting: Adult Health

## 2021-09-10 ENCOUNTER — Other Ambulatory Visit: Payer: Self-pay

## 2021-09-10 ENCOUNTER — Encounter: Payer: Self-pay | Admitting: Physician Assistant

## 2021-09-10 DIAGNOSIS — G43809 Other migraine, not intractable, without status migrainosus: Secondary | ICD-10-CM

## 2021-09-10 MED ORDER — NAPROXEN SODIUM 550 MG PO TABS: 550.0000 mg | ORAL_TABLET | Freq: Two times a day (BID) | ORAL | 2 refills | Status: DC

## 2021-09-10 MED ORDER — RIZATRIPTAN BENZOATE 5 MG PO TABS: 5.0000 mg | ORAL_TABLET | ORAL | 2 refills | Status: AC | PRN

## 2021-09-17 ENCOUNTER — Encounter: Payer: Self-pay | Admitting: Advanced Practice Midwife

## 2021-09-17 ENCOUNTER — Ambulatory Visit: Payer: BC Managed Care – PPO | Admitting: Advanced Practice Midwife

## 2021-09-17 ENCOUNTER — Other Ambulatory Visit: Payer: Self-pay

## 2021-09-17 VITALS — BP 110/70 | Ht 70.0 in | Wt 180.0 lb

## 2021-09-17 DIAGNOSIS — R1032 Left lower quadrant pain: Secondary | ICD-10-CM

## 2021-09-17 NOTE — Progress Notes (Signed)
Patient ID: Laura Barber, female   DOB: 05/03/1966, 56 y.o.   MRN: [id number]  Reason for Consult: Ovarian Cyst (Left side pain x 1 month )    Subjective:  HPI:  Laura Barber is a 56 y.o. female being seen for left lower quadrant pain that she suspects is an ovarian cyst. The pain is constant and dull for the past 4 weeks. It worsens with exercise. She has not tried anything to decrease the pain. She reports a history of ovarian cysts with alternating lower quadrant pain 3 to 4 times per year. The cysts would usually resolve within 2-3 weeks.   Past Medical History:  Diagnosis Date   Anxiety    Cancer ([place])    cervical   Cervical cancer, FIGO stage IB1 ([place]) 2005   Cervical   GERD (gastroesophageal reflux disease)    Headache    Hyperlipidemia    Hypertension    Migraine    Shingles 2019   Family History  Problem Relation Age of Onset   Lung cancer Mother    Hyperlipidemia Mother    Diabetes Father    Hypertension Father    Heart failure Father    Heart attack Father    Heart disease Father    Hyperlipidemia Brother    Breast cancer Neg Hx    Past Surgical History:  Procedure Laterality Date   ABDOMINAL HYSTERECTOMY  2005   Radical hysterectomy by Dr Clarene Essex at [facility] cervical cancer 1B1   BREAST CYST ASPIRATION Left 05/30/2013   CERVICAL [place] ARTHROPLASTY Right 06/11/2016   Procedure: CERVICAL ANTERIOR [place] 5-7;  Surgeon: Blanche East, MD;  Location: [facility] ORS;  Service: Neurosurgery;  Laterality: Right;   DIAGNOSTIC LAPAROSCOPY  2005   DILATION AND CURETTAGE OF UTERUS     SAB   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    NASAL SINUS SURGERY      Short Social History:  Social History   Tobacco Use   Smoking status: Never   Smokeless tobacco: Never  Substance Use Topics   Alcohol use: Yes    Comment: occassional    Allergies  Allergen Reactions   Claritin-D 12 Hour  [Loratadine-Pseudoephedrine Er] Itching and Swelling   Sulfa Antibiotics Itching and Swelling    Current Outpatient Medications  Medication Sig Dispense Refill   ALPRAZolam (XANAX) 0.5 MG tablet Take 1 tablet (0.5 mg total) by mouth at bedtime as needed for anxiety. 30 tablet 1   atorvastatin (LIPITOR) 10 MG tablet TAKE (1) TABLET BY MOUTH EVERY DAY AT 6 IN THE EVENING 90 tablet 1   cetirizine (ZYRTEC) 10 MG tablet Take 10 mg by mouth daily.     desonide (DESOWEN) 0.05 % cream APPLY A SMALL AMOUNT TO SKIN TWICE DAILYAS NEEDED 30 g 3   estradiol (VIVELLE-DOT) 0.0375 MG/24HR Place 1 patch onto the skin 2 (two) times a week. 24 patch 3   metoprolol tartrate (LOPRESSOR) 25 MG tablet TAKE 1/2 TABLET BY MOUTH TWICE DAILY 90 tablet 0   Multiple Vitamin (MULTIVITAMIN) tablet Take 1 tablet by mouth daily.     naproxen sodium (ANAPROX) 550 MG tablet Take 1 tablet (550 mg total) by mouth 2 (two) times daily with a meal. 60 tablet 2   omeprazole (PRILOSEC) 20 MG capsule Take 1 capsule (20 mg total) by mouth daily. 30 capsule 3   rizatriptan (MAXALT) 5 MG tablet Take 1 tablet (5 mg total) by mouth as needed  for migraine. 10 tablet 2   valACYclovir (VALTREX) 1000 MG tablet Take 1 tablet (1,000 mg total) by mouth as needed. 30 tablet 2   valsartan-hydrochlorothiazide (DIOVAN-HCT) 80-12.5 MG tablet Take one tablet by mouth daily 90 tablet 1   venlafaxine XR (EFFEXOR-XR) 37.5 MG 24 hr capsule Take 1 capsule by mouth daily with breakfast 30 capsule 3   No current facility-administered medications for this visit.    Review of Systems  Constitutional:  Negative for chills and fever.  HENT:  Negative for congestion, ear discharge, ear pain, hearing loss, sinus pain and sore throat.   Eyes:  Negative for blurred vision and double vision.  Respiratory:  Negative for cough, shortness of breath and wheezing.   Cardiovascular:  Negative for chest pain, palpitations and leg swelling.  Gastrointestinal:  Negative  for abdominal pain, blood in stool, constipation, diarrhea, heartburn, melena, nausea and vomiting.       Positive for left lower quadrant pain  Genitourinary:  Negative for dysuria, flank pain, frequency, hematuria and urgency.  Musculoskeletal:  Negative for back pain, joint pain and myalgias.  Skin:  Negative for itching and rash.  Neurological:  Negative for dizziness, tingling, tremors, sensory change, speech change, focal weakness, seizures, loss of consciousness, weakness and headaches.  Endo/Heme/Allergies:  Negative for environmental allergies. Does not bruise/bleed easily.  Psychiatric/Behavioral:  Negative for depression, hallucinations, memory loss, substance abuse and suicidal ideas. The patient is not nervous/anxious and does not have insomnia.        Objective:  Objective   Vitals:   09/17/21 1538  BP: 110/70  Weight: 180 lb (81.6 kg)  Height: 5\' 10"  (1.778 m)   Body mass index is 25.83 kg/m. Constitutional: Well nourished, well developed female in no acute distress.  HEENT: normal Skin: Warm and dry.  Cardiovascular: Regular rate and rhythm.   Extremity:  no edema   Respiratory: Clear to auscultation bilateral. Normal respiratory effort Abdomen: mild tenderness to palpation in left lower quadrant slightly above level of ovary, no masses palpated Neuro: DTRs 2+, Cranial nerves grossly intact Psych: Alert and Oriented x3. No memory deficits. Normal mood and affect.   Pelvic exam:  is not limited by body habitus EGBUS: within normal limits Vagina: within normal limits and with normal mucosa Cervix: surgically absent Uterus: surgically absent Adnexa: non-tender, not enlarged   Assessment/Plan:     56 y.o. G1 P10 female with left lower quadrant pain  Gyn ultrasound to rule out ovarian cyst Follow up as needed after imaging   [facility] 09/17/2021, 4:39 PM

## 2021-09-19 ENCOUNTER — Encounter: Payer: Self-pay | Admitting: Physician Assistant

## 2021-09-19 ENCOUNTER — Other Ambulatory Visit: Payer: Self-pay

## 2021-09-19 DIAGNOSIS — F411 Generalized anxiety disorder: Secondary | ICD-10-CM

## 2021-09-19 DIAGNOSIS — M9905 Segmental and somatic dysfunction of pelvic region: Secondary | ICD-10-CM | POA: Diagnosis not present

## 2021-09-19 MED ORDER — VENLAFAXINE HCL ER 37.5 MG PO CP24: ORAL_CAPSULE | ORAL | 3 refills | Status: DC

## 2021-09-20 ENCOUNTER — Other Ambulatory Visit: Payer: Self-pay

## 2021-09-20 ENCOUNTER — Ambulatory Visit
Admission: RE | Admit: 2021-09-20 | Discharge: 2021-09-20 | Disposition: A | Discharge status: Home / Self Care | Payer: BC Managed Care – PPO | Source: Ambulatory Visit | Attending: Advanced Practice Midwife | Admitting: Advanced Practice Midwife

## 2021-09-20 DIAGNOSIS — Z90710 Acquired absence of both cervix and uterus: Secondary | ICD-10-CM | POA: Diagnosis not present

## 2021-09-20 DIAGNOSIS — R1032 Left lower quadrant pain: Secondary | ICD-10-CM | POA: Diagnosis not present

## 2021-09-20 DIAGNOSIS — Z780 Asymptomatic menopausal state: Secondary | ICD-10-CM | POA: Diagnosis not present

## 2021-09-25 ENCOUNTER — Ambulatory Visit: Payer: BC Managed Care – PPO | Admitting: Advanced Practice Midwife

## 2021-09-25 ENCOUNTER — Encounter: Payer: Self-pay | Admitting: Advanced Practice Midwife

## 2021-09-25 ENCOUNTER — Other Ambulatory Visit: Payer: Self-pay

## 2021-09-25 VITALS — BP 130/78 | Ht 70.0 in | Wt 180.0 lb

## 2021-09-25 DIAGNOSIS — Z712 Person consulting for explanation of examination or test findings: Secondary | ICD-10-CM | POA: Diagnosis not present

## 2021-09-25 DIAGNOSIS — C44629 Squamous cell carcinoma of skin of left upper limb, including shoulder: Secondary | ICD-10-CM | POA: Diagnosis not present

## 2021-09-25 DIAGNOSIS — D485 Neoplasm of uncertain behavior of skin: Secondary | ICD-10-CM | POA: Diagnosis not present

## 2021-09-25 DIAGNOSIS — Z872 Personal history of diseases of the skin and subcutaneous tissue: Secondary | ICD-10-CM | POA: Diagnosis not present

## 2021-09-25 NOTE — Progress Notes (Signed)
Patient ID: Laura Barber, female   DOB: 08-Oct-1965, 56 y.o.   MRN: [id number]  Reason for Consult: Follow-up (U/s follow up )   Subjective:  HPI:  Laura Barber is a 56 y.o. female being seen following ultrasound for left pelvic pain. The pain has improved since her visit on 09/17/21. We discussed the results of her pelvic ultrasound. Ovaries were not seen due to either being surgically absent or obscured by bowel gas per the report. She will follow up with MD as needed for reoccurring pain. I attempted to log in to [facility] to access her records regarding her hysterectomy and was unsuccessful at this time. She does not think her ovaries were removed with her uterus.  Past Medical History:  Diagnosis Date   Anxiety    Cancer ([place])    cervical   Cervical cancer, FIGO stage IB1 ([place]) 2005   Cervical   GERD (gastroesophageal reflux disease)    Headache    Hyperlipidemia    Hypertension    Migraine    Shingles 2019   Family History  Problem Relation Age of Onset   Lung cancer Mother    Hyperlipidemia Mother    Diabetes Father    Hypertension Father    Heart failure Father    Heart attack Father    Heart disease Father    Hyperlipidemia Brother    Breast cancer Neg Hx    Past Surgical History:  Procedure Laterality Date   ABDOMINAL HYSTERECTOMY  2005   Radical hysterectomy by Dr Clarene Essex at [facility] cervical cancer 1B1   BREAST CYST ASPIRATION Left 05/30/2013   CERVICAL [place] ARTHROPLASTY Right 06/11/2016   Procedure: CERVICAL ANTERIOR [place] 5-7;  Surgeon: Blanche East, MD;  Location: [facility] ORS;  Service: Neurosurgery;  Laterality: Right;   DIAGNOSTIC LAPAROSCOPY  2005   DILATION AND CURETTAGE OF UTERUS     SAB   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    NASAL SINUS SURGERY      Short Social History:  Social History   Tobacco Use   Smoking status: Never   Smokeless tobacco: Never  Substance Use  Topics   Alcohol use: Yes    Comment: occassional    Allergies  Allergen Reactions   Claritin-D 12 Hour [Loratadine-Pseudoephedrine Er] Itching and Swelling   Sulfa Antibiotics Itching and Swelling    Current Outpatient Medications  Medication Sig Dispense Refill   ALPRAZolam (XANAX) 0.5 MG tablet Take 1 tablet (0.5 mg total) by mouth at bedtime as needed for anxiety. 30 tablet 1   atorvastatin (LIPITOR) 10 MG tablet TAKE (1) TABLET BY MOUTH EVERY DAY AT 6 IN THE EVENING 90 tablet 1   cetirizine (ZYRTEC) 10 MG tablet Take 10 mg by mouth daily.     desonide (DESOWEN) 0.05 % cream APPLY A SMALL AMOUNT TO SKIN TWICE DAILYAS NEEDED 30 g 3   estradiol (VIVELLE-DOT) 0.0375 MG/24HR Place 1 patch onto the skin 2 (two) times a week. 24 patch 3   metoprolol tartrate (LOPRESSOR) 25 MG tablet TAKE 1/2 TABLET BY MOUTH TWICE DAILY 90 tablet 0   Multiple Vitamin (MULTIVITAMIN) tablet Take 1 tablet by mouth daily.     naproxen sodium (ANAPROX) 550 MG tablet Take 1 tablet (550 mg total) by mouth 2 (two) times daily with a meal. 60 tablet 2   omeprazole (PRILOSEC) 20 MG capsule Take 1 capsule (20 mg total) by mouth daily. 30 capsule 3  rizatriptan (MAXALT) 5 MG tablet Take 1 tablet (5 mg total) by mouth as needed for migraine. 10 tablet 2   valACYclovir (VALTREX) 1000 MG tablet Take 1 tablet (1,000 mg total) by mouth as needed. 30 tablet 2   valsartan-hydrochlorothiazide (DIOVAN-HCT) 80-12.5 MG tablet Take one tablet by mouth daily 90 tablet 1   venlafaxine XR (EFFEXOR-XR) 37.5 MG 24 hr capsule Take 1 capsule by mouth daily with breakfast 30 capsule 3   No current facility-administered medications for this visit.   Review of Systems  Constitutional:  Negative for chills and fever.  HENT:  Negative for congestion, ear discharge, ear pain, hearing loss, sinus pain and sore throat.   Eyes:  Negative for blurred vision and double vision.  Respiratory:  Negative for cough, shortness of breath and  wheezing.   Cardiovascular:  Negative for chest pain, palpitations and leg swelling.  Gastrointestinal:  Negative for abdominal pain, blood in stool, constipation, diarrhea, heartburn, melena, nausea and vomiting.  Genitourinary:  Negative for dysuria, flank pain, frequency, hematuria and urgency.  Musculoskeletal:  Negative for back pain, joint pain and myalgias.  Skin:  Negative for itching and rash.  Neurological:  Negative for dizziness, tingling, tremors, sensory change, speech change, focal weakness, seizures, loss of consciousness, weakness and headaches.  Endo/Heme/Allergies:  Negative for environmental allergies. Does not bruise/bleed easily.  Psychiatric/Behavioral:  Negative for depression, hallucinations, memory loss, substance abuse and suicidal ideas. The patient is not nervous/anxious and does not have insomnia.        Objective:  Objective   Vitals:   09/25/21 1554  BP: 130/78  Weight: 180 lb (81.6 kg)  Height: 5\' 10"  (1.778 m)   Body mass index is 25.83 kg/m. Constitutional: Well nourished, well developed female in no acute distress.  HEENT: normal Skin: Warm and dry.  Extremity:  no edema   Respiratory: Normal respiratory effort Neuro: DTRs 2+, Cranial nerves grossly intact Psych: Alert and Oriented x3. No memory deficits. Normal mood and affect.    Data: 09/20/21 CLINICAL DATA:  LEFT lower quadrant pain for 4 months, postmenopausal, hysterectomy for cervical cancer, surgical status of ovaries uncertain   EXAM: TRANSABDOMINAL AND TRANSVAGINAL ULTRASOUND OF PELVIS   TECHNIQUE: Both transabdominal and transvaginal ultrasound examinations of the pelvis were performed. Transabdominal technique was performed for global imaging of the pelvis including uterus, ovaries, adnexal regions, and pelvic cul-de-sac. It was necessary to proceed with endovaginal exam following the transabdominal exam to visualize the ovaries and adnexa.   COMPARISON:  None    FINDINGS: Uterus   Surgically absent   Endometrium   Surgically absent   Right ovary   Not visualized, question surgically absent versus obscured by bowel   Left ovary   Not visualized, question surgically absent versus obscured by bowel   Other findings   No free pelvic fluid or adnexal masses.   IMPRESSION: Post hysterectomy with nonvisualization of ovaries.   No pelvic sonographic abnormalities identified.   Electronically Signed   By: Lavonia Dana M.D.   On: 09/20/2021 16:21 Assessment/Plan:     56 y.o. G1 P0010 female follow up after pelvic ultrasound- ovaries not visualized  Follow up with MD as needed if pain returns   [place] [facility] 09/25/2021, 4:44 PM

## 2021-10-14 DIAGNOSIS — C44629 Squamous cell carcinoma of skin of left upper limb, including shoulder: Secondary | ICD-10-CM | POA: Diagnosis not present

## 2021-10-14 DIAGNOSIS — D0462 Carcinoma in situ of skin of left upper limb, including shoulder: Secondary | ICD-10-CM | POA: Diagnosis not present

## 2021-10-25 DIAGNOSIS — M9905 Segmental and somatic dysfunction of pelvic region: Secondary | ICD-10-CM | POA: Diagnosis not present

## 2021-11-04 ENCOUNTER — Encounter: Payer: Self-pay | Admitting: Physician Assistant

## 2021-11-04 ENCOUNTER — Other Ambulatory Visit: Payer: Self-pay

## 2021-11-04 DIAGNOSIS — I10 Essential (primary) hypertension: Secondary | ICD-10-CM

## 2021-11-04 MED ORDER — VALSARTAN-HYDROCHLOROTHIAZIDE 80-12.5 MG PO TABS: ORAL_TABLET | ORAL | 1 refills | Status: DC

## 2021-11-08 ENCOUNTER — Other Ambulatory Visit: Payer: Self-pay | Admitting: Physician Assistant

## 2021-11-14 DIAGNOSIS — M9905 Segmental and somatic dysfunction of pelvic region: Secondary | ICD-10-CM | POA: Diagnosis not present

## 2021-11-25 ENCOUNTER — Encounter: Payer: BC Managed Care – PPO | Admitting: Nurse Practitioner

## 2021-12-03 ENCOUNTER — Telehealth: Payer: Self-pay

## 2021-12-03 NOTE — Telephone Encounter (Signed)
Left vm and sent mychart message to confirm 12/10/21 appointment-Toni ?

## 2021-12-10 ENCOUNTER — Ambulatory Visit (INDEPENDENT_AMBULATORY_CARE_PROVIDER_SITE_OTHER): Payer: BC Managed Care – PPO | Admitting: Nurse Practitioner

## 2021-12-10 ENCOUNTER — Encounter: Payer: Self-pay | Admitting: Nurse Practitioner

## 2021-12-10 VITALS — BP 111/73 | HR 80 | Temp 97.6°F | Resp 16 | Ht 70.0 in | Wt 178.6 lb

## 2021-12-10 DIAGNOSIS — K219 Gastro-esophageal reflux disease without esophagitis: Secondary | ICD-10-CM | POA: Diagnosis not present

## 2021-12-10 DIAGNOSIS — I10 Essential (primary) hypertension: Secondary | ICD-10-CM | POA: Diagnosis not present

## 2021-12-10 DIAGNOSIS — R300 Dysuria: Secondary | ICD-10-CM | POA: Diagnosis not present

## 2021-12-10 DIAGNOSIS — Z0001 Encounter for general adult medical examination with abnormal findings: Secondary | ICD-10-CM | POA: Diagnosis not present

## 2021-12-10 DIAGNOSIS — E782 Mixed hyperlipidemia: Secondary | ICD-10-CM

## 2021-12-10 DIAGNOSIS — F411 Generalized anxiety disorder: Secondary | ICD-10-CM

## 2021-12-10 NOTE — Progress Notes (Signed)
[facility] ?[address] ?[place], [place] [address] ? ?Internal MEDICINE  ?Office Visit Note ? ?Patient Name: Laura Barber ? [id number]  ?[id number] ? ?Date of Service: 12/10/2021 ? ?Chief Complaint  ?Patient presents with  ? Annual Exam  ? Gastroesophageal Reflux  ? Hypertension  ? Hyperlipidemia  ? ? ?HPI ?Laura Barber presents for an annual well visit and physical exam.  She is a well-appearing 56 year old female with hypertension, gastroesophageal reflux, anxiety, hyperlipidemia and history of cervical cancer. She has labs that were ordered in December that she still needs to have drawn.  ?She has no specific questions or concerns. She denies any new or worsening pain.  ?Her preventive screenings are up to date.  ? ? ? ? ?Current Medication: ?Outpatient Encounter Medications as of 12/10/2021  ?Medication Sig Note  ? ALPRAZolam (XANAX) 0.5 MG tablet Take 1 tablet (0.5 mg total) by mouth at bedtime as needed for anxiety. 02/11/2021: As needed  ? atorvastatin (LIPITOR) 10 MG tablet TAKE (1) TABLET BY MOUTH EVERY DAY AT 6 IN THE EVENING   ? cetirizine (ZYRTEC) 10 MG tablet Take 10 mg by mouth daily.   ? desonide (DESOWEN) 0.05 % cream APPLY A SMALL AMOUNT TO SKIN TWICE DAILYAS NEEDED   ? estradiol (VIVELLE-DOT) 0.0375 MG/24HR Place 1 patch onto the skin 2 (two) times a week.   ? metoprolol tartrate (LOPRESSOR) 25 MG tablet TAKE 1/2 TABLET BY MOUTH TWICE DAILY   ? Multiple Vitamin (MULTIVITAMIN) tablet Take 1 tablet by mouth daily.   ? naproxen sodium (ANAPROX) 550 MG tablet Take 1 tablet (550 mg total) by mouth 2 (two) times daily with a meal.   ? omeprazole (PRILOSEC) 20 MG capsule Take 1 capsule (20 mg total) by mouth daily.   ? rizatriptan (MAXALT) 5 MG tablet Take 1 tablet (5 mg total) by mouth as needed for migraine.   ? valACYclovir (VALTREX) 1000 MG tablet Take 1 tablet (1,000 mg total) by mouth as needed.   ? valsartan-hydrochlorothiazide (DIOVAN-HCT) 80-12.5 MG tablet Take one tablet by mouth daily    ? venlafaxine XR (EFFEXOR-XR) 37.5 MG 24 hr capsule Take 1 capsule by mouth daily with breakfast   ? ?No facility-administered encounter medications on file as of 12/10/2021.  ? ? ?Surgical History: ?Past Surgical History:  ?Procedure Laterality Date  ? ABDOMINAL HYSTERECTOMY  2005  ? Radical hysterectomy by Dr Clarene Essex at [facility] cervical cancer 1B1  ? BREAST CYST ASPIRATION Left 05/30/2013  ? CERVICAL [place] ARTHROPLASTY Right 06/11/2016  ? Procedure: CERVICAL ANTERIOR [place] ARTHROPLASTY CERVICAL 5-7;  Surgeon: Blanche East, MD;  Location: [facility] ORS;  Service: Neurosurgery;  Laterality: Right;  ? DIAGNOSTIC LAPAROSCOPY  2005  ? DILATION AND CURETTAGE OF UTERUS    ? SAB  ? LEEP  2005  ? Dr. Laurey Morale - invasive endocervical adenocarcinoma.   ? NASAL SINUS SURGERY    ? ? ?Medical History: ?Past Medical History:  ?Diagnosis Date  ? Anxiety   ? Cancer [facility])   ? cervical  ? Cervical cancer, FIGO stage IB1 ([place]) 2005  ? Cervical  ? GERD (gastroesophageal reflux disease)   ? Headache   ? Hyperlipidemia   ? Hypertension   ? Migraine   ? Shingles 2019  ? ? ?Family History: ?Family History  ?Problem Relation Age of Onset  ? Lung cancer Mother   ? Hyperlipidemia Mother   ? Diabetes Father   ? Hypertension Father   ? Heart failure Father   ? Heart attack Father   ?  Heart disease Father   ? Hyperlipidemia Brother   ? Breast cancer Neg Hx   ? ? ?Social History  ? ?Socioeconomic History  ? Marital status: Divorced  ?  Spouse name: Not on file  ? Number of children: 0  ? Years of education: Not on file  ? Highest education level: Not on file  ?Occupational History  ? Not on file  ?Tobacco Use  ? Smoking status: Never  ? Smokeless tobacco: Never  ?Vaping Use  ? Vaping Use: Never used  ?Substance and Sexual Activity  ? Alcohol use: Yes  ?  Comment: occassional  ? Drug use: No  ? Sexual activity: Yes  ?  Partners: Male  ?  Birth control/protection: Surgical  ?Other Topics Concern  ? Not on file  ?Social History Narrative  ? Not  on file  ? ?Social Determinants of Health  ? ?Financial Resource Strain: Not on file  ?Food Insecurity: Not on file  ?Transportation Needs: Not on file  ?Physical Activity: Not on file  ?Stress: Not on file  ?Social Connections: Not on file  ?Intimate Partner Violence: Not on file  ? ? ? ? ?Review of Systems  ?Constitutional:  Negative for activity change, appetite change, chills, fatigue, fever and unexpected weight change.  ?HENT: Negative.  Negative for congestion, ear pain, rhinorrhea, sore throat and trouble swallowing.   ?Eyes: Negative.   ?Respiratory: Negative.  Negative for cough, chest tightness, shortness of breath and wheezing.   ?Cardiovascular: Negative.  Negative for chest pain.  ?Gastrointestinal: Negative.  Negative for abdominal pain, blood in stool, constipation, diarrhea, nausea and vomiting.  ?Endocrine: Negative.   ?Genitourinary: Negative.  Negative for difficulty urinating, dysuria, frequency, hematuria and urgency.  ?Musculoskeletal: Negative.  Negative for arthralgias, back pain, joint swelling, myalgias and neck pain.  ?Skin: Negative.  Negative for rash and wound.  ?Allergic/Immunologic: Negative.  Negative for immunocompromised state.  ?Neurological: Negative.  Negative for dizziness, seizures, numbness and headaches.  ?Hematological: Negative.   ?Psychiatric/Behavioral: Negative.  Negative for behavioral problems, self-injury and suicidal ideas. The patient is not nervous/anxious.   ? ?Vital Signs: ?BP 111/73   Pulse 80   Temp 97.6 ?F (36.4 ?C)   Resp 16   Ht '5\' 10"'$  (1.778 m)   Wt 178 lb 9.6 oz (81 kg)   SpO2 98%   BMI 25.63 kg/m?  ? ? ?Physical Exam ?Vitals reviewed.  ?Constitutional:   ?   General: She is awake. She is not in acute distress. ?   Appearance: Normal appearance. She is well-developed, well-groomed and normal weight. She is not ill-appearing or diaphoretic.  ?HENT:  ?   Head: Normocephalic and atraumatic.  ?   Right Ear: Tympanic membrane, ear canal and external  ear normal. There is no impacted cerumen.  ?   Left Ear: Tympanic membrane, ear canal and external ear normal. There is no impacted cerumen.  ?   Nose: Nose normal. No congestion or rhinorrhea.  ?   Mouth/Throat:  ?   Lips: Pink.  ?   Mouth: Mucous membranes are moist.  ?   Pharynx: Oropharynx is clear. No oropharyngeal exudate.  ?Eyes:  ?   General: Lids are normal. Vision grossly intact. Gaze aligned appropriately. No scleral icterus.    ?   Right eye: No discharge.     ?   Left eye: No discharge.  ?   Conjunctiva/sclera: Conjunctivae normal.  ?   Pupils: Pupils are equal, round, and reactive to light.  ?  Funduscopic exam: ?   Right eye: Red reflex present.     ?   Left eye: Red reflex present. ?Neck:  ?   Thyroid: No thyromegaly.  ?   Vascular: No JVD.  ?   Trachea: No tracheal deviation.  ?Cardiovascular:  ?   Rate and Rhythm: Normal rate and regular rhythm.  ?   Pulses: Normal pulses.  ?   Heart sounds: Normal heart sounds, S1 normal and S2 normal. No murmur heard. ?  No friction rub. No gallop.  ?Pulmonary:  ?   Effort: Pulmonary effort is normal. No respiratory distress.  ?   Breath sounds: Normal breath sounds. No stridor. No wheezing or rales.  ?Chest:  ?   Chest wall: No tenderness.  ?Abdominal:  ?   General: Bowel sounds are normal. There is no distension.  ?   Palpations: Abdomen is soft. There is no mass.  ?   Tenderness: There is no abdominal tenderness. There is no guarding or rebound.  ?Musculoskeletal:     ?   General: No tenderness or deformity. Normal range of motion.  ?   Cervical back: Normal range of motion and neck supple.  ?   Right lower leg: No edema.  ?   Left lower leg: No edema.  ?Lymphadenopathy:  ?   Cervical: No cervical adenopathy.  ?Skin: ?   General: Skin is warm and dry.  ?   Capillary Refill: Capillary refill takes less than 2 seconds.  ?   Coloration: Skin is not pale.  ?   Findings: No erythema or rash.  ?Neurological:  ?   Mental Status: She is alert and oriented to person,  place, and time.  ?   Cranial Nerves: No cranial nerve deficit.  ?   Motor: No abnormal muscle tone.  ?   Coordination: Coordination normal.  ?   Deep Tendon Reflexes: Reflexes are normal and symmetric.  ?Psy

## 2021-12-11 LAB — UA/M W/RFLX CULTURE, ROUTINE
Bilirubin, UA: NEGATIVE
Glucose, UA: NEGATIVE
Ketones, UA: NEGATIVE
Leukocytes,UA: NEGATIVE
Nitrite, UA: NEGATIVE
Protein,UA: NEGATIVE
RBC, UA: NEGATIVE
Specific Gravity, UA: 1.021 (ref 1.005–1.030)
Urobilinogen, Ur: 1 mg/dL (ref 0.2–1.0)
pH, UA: 6 (ref 5.0–7.5)

## 2021-12-11 LAB — MICROSCOPIC EXAMINATION
Bacteria, UA: NONE SEEN
Casts: NONE SEEN /lpf
WBC, UA: NONE SEEN /hpf (ref 0–5)

## 2021-12-13 DIAGNOSIS — M9905 Segmental and somatic dysfunction of pelvic region: Secondary | ICD-10-CM | POA: Diagnosis not present

## 2021-12-16 DIAGNOSIS — Z859 Personal history of malignant neoplasm, unspecified: Secondary | ICD-10-CM | POA: Diagnosis not present

## 2021-12-27 ENCOUNTER — Encounter: Payer: Self-pay | Admitting: Nurse Practitioner

## 2022-01-06 ENCOUNTER — Encounter: Payer: Self-pay | Admitting: Physician Assistant

## 2022-01-06 ENCOUNTER — Other Ambulatory Visit: Payer: Self-pay | Admitting: Physician Assistant

## 2022-01-06 DIAGNOSIS — E785 Hyperlipidemia, unspecified: Secondary | ICD-10-CM

## 2022-01-09 DIAGNOSIS — M9905 Segmental and somatic dysfunction of pelvic region: Secondary | ICD-10-CM | POA: Diagnosis not present

## 2022-01-21 ENCOUNTER — Other Ambulatory Visit: Payer: Self-pay

## 2022-01-21 ENCOUNTER — Encounter: Payer: Self-pay | Admitting: Physician Assistant

## 2022-01-21 DIAGNOSIS — F411 Generalized anxiety disorder: Secondary | ICD-10-CM

## 2022-01-21 MED ORDER — VENLAFAXINE HCL ER 37.5 MG PO CP24: ORAL_CAPSULE | ORAL | 1 refills | Status: DC

## 2022-01-24 ENCOUNTER — Encounter: Payer: Self-pay | Admitting: Physician Assistant

## 2022-02-13 DIAGNOSIS — M9905 Segmental and somatic dysfunction of pelvic region: Secondary | ICD-10-CM | POA: Diagnosis not present

## 2022-03-06 ENCOUNTER — Encounter: Payer: Self-pay | Admitting: Physician Assistant

## 2022-03-06 ENCOUNTER — Ambulatory Visit: Payer: BC Managed Care – PPO | Admitting: Physician Assistant

## 2022-03-06 VITALS — BP 122/78 | HR 69 | Ht 70.0 in | Wt 181.0 lb

## 2022-03-06 DIAGNOSIS — I5189 Other ill-defined heart diseases: Secondary | ICD-10-CM

## 2022-03-06 DIAGNOSIS — I10 Essential (primary) hypertension: Secondary | ICD-10-CM | POA: Diagnosis not present

## 2022-03-06 DIAGNOSIS — E782 Mixed hyperlipidemia: Secondary | ICD-10-CM | POA: Diagnosis not present

## 2022-03-06 DIAGNOSIS — I493 Ventricular premature depolarization: Secondary | ICD-10-CM | POA: Diagnosis not present

## 2022-03-06 DIAGNOSIS — R002 Palpitations: Secondary | ICD-10-CM | POA: Diagnosis not present

## 2022-03-06 NOTE — Progress Notes (Signed)
Cardiology Office Note    Date:  03/06/2022   ID:  Laura Barber, DOB 25-Nov-1965, MRN [id number]  PCP:  Mylinda Latina, PA-C  Cardiologist:  Kathlyn Sacramento, MD  Electrophysiologist:  None   Chief Complaint: Follow-up  History of Present Illness:   Laura Barber is a 57 y.o. female with history of palpitations/PVCs, hypertension, hyperlipidemia, anxiety, and GERD who presents for follow up of palpitations.   She was evaluated by Dr. Fletcher Anon on 04/09/2018 for palpitations and PVCs.  She reported onset of palpitations over the prior few months that were described as a fluttering sensation mostly at rest and most noticeable at night when she was trying to sleep.  There was associated mild shortness of breath.  Prior 48-hour Holter monitor showed 2400 PVCs in 48 hours.  Upon cardiology review of the monitor there was noted to be quite a bit of motion artifact which may have interfered with the count.  It was felt her PVC burden was much less than the reported 2400.  She reported a family history remarkable for CAD, though not prematurely.  There was no family history of sudden death.  Echo in 2018/05/06 showed an EF of 60 to 65%, no regional wall motion abnormalities, normal LV diastolic function, normal RV cavity size and systolic function.  No significant valvular abnormalities.  She was seen in the office on 09/10/2018 noting an increase in palpitations with associated dizziness.  She continued to be able to exercise without issues.  In this setting, she underwent 2 separate 14-day Zio monitors with each showing normal sinus rhythm with isolated PVCs with the first monitor showing an overall burden of 2.4% which was felt to be overestimated secondary to artifact.  The second Zio monitor showed sinus rhythm with rare PACs and PVCs with an overall burden of less than 1% with rare ventricular couplets/triplets/ventricular bigeminy/trigeminy.  She was seen in 09/2018 noting an improvement in her  tachypalpitations and flushing with the initiation of Lopressor.  Given ventricular ectopy noted on outpatient cardiac monitoring she underwent ETT in 10/2018 which showed no evidence of ischemia and average exercise capacity with an exercise duration of 7 minutes and 38 seconds achieving a workload of 9.5 METs.  Echo ordered by outside office in 04/2020 demonstrated, normal LV systolic function with an EF of 60 to 65%, normal wall motion, diastolic dysfunction, mild mitral regurgitation, and trace tricuspid regurgitation.  She was seen in the office in 06/2020 and doing very well from a cardiac perspective.  She noted resolution of palpitations and was without chest pain.  She did note mild dizziness with quick positional changes.  She was continued on low-dose Lopressor.   She was seen by an urgent care on 09/28/2020 with chest pressure.  Tenderness was noted when she pressed on her chest.  Her symptoms were reproducible to palpation on their exam.  EKG showed NSR, 71 bpm, no acute ST-T changes.  High-sensitivity troponin negative x1.  Chest x-ray without acute cardiopulmonary abnormality.  Symptoms were suspected to be musculoskeletal in etiology.  She followed up with cardiology on 10/12/2020, and had not had any further chest discomfort.  She did note she was under significant stress.  She underwent coronary CTA on 10/25/2020, which showed a calcium score of 0 and no evidence of CAD.  She was last seen in the office in 11/2020 and was without symptoms of angina or decompensation.  She comes in doing well from a cardiac perspective and is without  symptoms of angina or decompensation.  No palpitations, presyncope, or syncope.  She does note some mild short-lived dizziness if she changes positions quickly.  She is tolerating metoprolol without issues.  Blood pressure and heart rate remain well controlled.  She has recently gotten back into the gym and is working out without cardiac limitation.  She is under some  increased stress at work and with some family health.   Labs independently reviewed: 07/2021 - TSH normal, TC 215, TG 109, HDL 85, LDL 111 09/2020 - potassium 4.3, BUN 25, serum creatinine 0.84, albumin 4.5, AST/ALT normal, Hgb 14.0, PLT 233 02/2020 - magnesium 2.0  Past Medical History:  Diagnosis Date   Anxiety    Cancer ([place])    cervical   Cervical cancer, FIGO stage IB1 ([place]) 2005   Cervical   GERD (gastroesophageal reflux disease)    Headache    Hyperlipidemia    Hypertension    Migraine    Shingles 2019    Past Surgical History:  Procedure Laterality Date   ABDOMINAL HYSTERECTOMY  2005   Radical hysterectomy by Dr Clarene Essex at [facility] cervical cancer 1B1   BREAST CYST ASPIRATION Left 05/30/2013   CERVICAL DISC ARTHROPLASTY Right 06/11/2016   Procedure: CERVICAL ANTERIOR [place] 5-7;  Surgeon: Blanche East, MD;  Location: [facility] ORS;  Service: Neurosurgery;  Laterality: Right;   DIAGNOSTIC LAPAROSCOPY  2005   DILATION AND CURETTAGE OF UTERUS     SAB   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    NASAL SINUS SURGERY      Current Medications: Current Meds  Medication Sig   ALPRAZolam (XANAX) 0.5 MG tablet Take 1 tablet (0.5 mg total) by mouth at bedtime as needed for anxiety.   atorvastatin (LIPITOR) 10 MG tablet TAKE ONE TABLET BY MOUTH EVERY DAY AT 6PM   cetirizine (ZYRTEC) 10 MG tablet Take 10 mg by mouth daily.   desonide (DESOWEN) 0.05 % cream APPLY A SMALL AMOUNT TO SKIN TWICE DAILYAS NEEDED   estradiol (VIVELLE-DOT) 0.0375 MG/24HR Place 1 patch onto the skin 2 (two) times a week.   metoprolol tartrate (LOPRESSOR) 25 MG tablet TAKE 1/2 TABLET BY MOUTH TWICE DAILY   Multiple Vitamin (MULTIVITAMIN) tablet Take 1 tablet by mouth daily.   naproxen sodium (ANAPROX) 550 MG tablet Take 1 tablet (550 mg total) by mouth 2 (two) times daily with a meal. (Patient taking differently: Take 550 mg by mouth as needed for  headache (migraines).)   omeprazole (PRILOSEC) 20 MG capsule Take 1 capsule (20 mg total) by mouth daily.   rizatriptan (MAXALT) 5 MG tablet Take 1 tablet (5 mg total) by mouth as needed for migraine.   valACYclovir (VALTREX) 1000 MG tablet Take 1 tablet (1,000 mg total) by mouth as needed.   valsartan-hydrochlorothiazide (DIOVAN-HCT) 80-12.5 MG tablet Take one tablet by mouth daily   venlafaxine XR (EFFEXOR-XR) 37.5 MG 24 hr capsule Take 1 capsule by mouth daily with breakfast    Allergies:   Claritin-d 12 hour [loratadine-pseudoephedrine er] and Sulfa antibiotics   Social History   Socioeconomic History   Marital status: Divorced    Spouse name: Not on file   Number of children: 0   Years of education: Not on file   Highest education level: Not on file  Occupational History   Not on file  Tobacco Use   Smoking status: Never   Smokeless tobacco: Never  Vaping Use   Vaping Use: Never used  Substance and Sexual Activity   Alcohol use: Yes    Comment: occassional   Drug use: No   Sexual activity: Yes    Partners: Male    Birth control/protection: Surgical  Other Topics Concern   Not on file  Social History Narrative   Not on file   Social Determinants of Health   Financial Resource Strain: Not on file  Food Insecurity: Not on file  Transportation Needs: Not on file  Physical Activity: Not on file  Stress: Not on file  Social Connections: Not on file     Family History:  The patient's family history includes Diabetes in her father; Heart attack in her father; Heart disease in her father; Heart failure in her father; Hyperlipidemia in her brother and mother; Hypertension in her father; Lung cancer in her mother. There is no history of Breast cancer.  ROS:   12-point review of systems is negative unless otherwise noted in the HPI.   EKGs/Labs/Other Studies Reviewed:    Studies reviewed were summarized above. The additional studies were reviewed today:  Coronary  CTA 10/25/2020: Aorta: Normal size. Minimal aortic root calcifications. No dissection.   Aortic Valve:  Trileaflet.  No calcifications.   Coronary Arteries:  Normal coronary origin.  Right dominance.   RCA is a large dominant artery that gives rise to PDA and PLA. There is no plaque.   Left main is a large artery that gives rise to LAD, Ramus and LCX arteries. There is no disease in the left main or Ramus arteries.   LAD has no plaque.   LCX is a non-dominant artery that gives rise to two obtuse marginal branches. There is no plaque.   Other findings:   Normal pulmonary vein drainage into the left atrium.   Normal left atrial appendage without a thrombus.   Normal size of the pulmonary artery.   IMPRESSION: 1. Coronary calcium score of 0. Patient is low risk for coronary events.   2. Normal coronary origin with right dominance.   3. No evidence of CAD.   4. CAD-RADS 0. No evidence of CAD (0%). Consider non-atherosclerotic causes of chest pain. __________   2D echo (outside office) 04/2020: EF 60 to 65%, normal wall motion, diastolic dysfunction, normal RV systolic function and ventricular cavity size, mild mitral regurgitation, trace tricuspid regurgitation __________   ETT 10/2018: Blood pressure demonstrated a normal response to exercise. There was no ST segment deviation noted during stress. No T wave inversion was noted during stress.   Normal treadmill stress test with no evidence of ischemia. Average exercise capacity with an exercise duration of 7 minutes and 38 seconds achieving a workload of 9.5 METS. __________   Elwyn Reach patch-2 08/2018: Normal sinus rhythm with an average heart rate of 74 bpm. Rare PACs and PVCs.  Overall burden is less than 1%. __________   Elwyn Reach patch-1 08/2018: Normal sinus rhythm with an average heart rate of 74 bpm. Isolated PACs. Occasional PVCs with an overall burden of 2.4%.  The burden of PVCs might be overestimated due to  artifacts. __________   2D echo 03/2018: - Left ventricle: The cavity size was normal. Wall thickness was    normal. Systolic function was normal. The estimated ejection    fraction was in the range of 60% to 65%. Wall motion was normal;    there were no regional wall motion abnormalities. Left    ventricular diastolic function parameters were normal.  - Right ventricle: The cavity size was normal.  Wall thickness was    at the upper limits of normal. Systolic function was normal.  - Pulmonary arteries: Systolic pressure could not be accurately    estimated.  __________   48-hour Holter (outside office) 03/2018: Sinus rhythm with an average heart rate of 70 bpm (range 41 to 118 bpm) with a 1% burden of PVCs and a less than 1% burden of PACs   EKG:  EKG is ordered today.  The EKG ordered today demonstrates NSR, 69 bpm, low voltage QRS, no acute ST-T changes  Recent Labs: 07/26/2021: TSH 1.810  Recent Lipid Panel    Component Value Date/Time   CHOL 215 (H) 07/26/2021 0849   TRIG 109 07/26/2021 0849   HDL 85 07/26/2021 0849   LDLCALC 111 (H) 07/26/2021 0849    PHYSICAL EXAM:    VS:  BP 122/78   Pulse 69   Ht '5\' 10"'$  (1.778 m)   Wt 181 lb (82.1 kg)   SpO2 98%   BMI 25.97 kg/m   BMI: Body mass index is 25.97 kg/m.  Physical Exam Vitals reviewed.  Constitutional:      Appearance: She is well-developed.  HENT:     Head: Normocephalic and atraumatic.  Eyes:     General:        Right eye: No discharge.        Left eye: No discharge.  Neck:     Vascular: No JVD.  Cardiovascular:     Rate and Rhythm: Normal rate and regular rhythm.     Heart sounds: Normal heart sounds, S1 normal and S2 normal. Heart sounds not distant. No midsystolic click and no opening snap. No murmur heard.    No friction rub.  Pulmonary:     Effort: Pulmonary effort is normal. No respiratory distress.     Breath sounds: Normal breath sounds. No decreased breath sounds, wheezing or rales.  Chest:      Chest wall: No tenderness.  Abdominal:     General: There is no distension.  Musculoskeletal:     Cervical back: Normal range of motion.  Skin:    General: Skin is warm and dry.     Nails: There is no clubbing.  Neurological:     Mental Status: She is alert and oriented to person, place, and time.  Psychiatric:        Speech: Speech normal.        Behavior: Behavior normal.        Thought Content: Thought content normal.        Judgment: Judgment normal.     Wt Readings from Last 3 Encounters:  03/06/22 181 lb (82.1 kg)  12/10/21 178 lb 9.6 oz (81 kg)  09/25/21 180 lb (81.6 kg)     ASSESSMENT & PLAN:   Palpitations/PVCs: Quiescent.  She remains on low-dose Lopressor.  In follow-up, we could consider transitioning her to Toprol-XL for ease of dosing.  HTN: Blood pressure is well controlled in the office today.  Continue current medical therapy including Lopressor and Diovan HCT.  Diastolic dysfunction: Not requiring a standing loop diuretic.  Euvolemic and well compensated.  Continue optimal blood pressure control.  Chest pain: No further symptoms.  Coronary CTA in 2022 showed no evidence of coronary artery disease.  Continue primary prevention and aggressive risk factor modification.  HLD: LDL 111 from 07/2021.  She remains on atorvastatin.   Disposition: F/u with Dr. Fletcher Anon or an APP in 12 months.   Medication Adjustments/Labs and Tests Ordered:  Current medicines are reviewed at length with the patient today.  Concerns regarding medicines are outlined above. Medication changes, Labs and Tests ordered today are summarized above and listed in the Patient Instructions accessible in Encounters.   Signed, Christell Faith, PA-C 03/06/2022 3:53 PM     [address] [place], [place] [address] [phone]

## 2022-03-06 NOTE — Patient Instructions (Signed)
Medication Instructions:  Your physician recommends that you continue on your current medications as directed. Please refer to the Current Medication list given to you today.  *If you need a refill on your cardiac medications before your next appointment, please call your pharmacy*   Lab Work: None ordered If you have labs (blood work) drawn today and your tests are completely normal, you will receive your results only by: [place] (if you have MyChart) OR A paper copy in the mail If you have any lab test that is abnormal or we need to change your treatment, we will call you to review the results.   Testing/Procedures: None ordered   Follow-Up: At [facility], you and your health needs are our priority.  As part of our continuing mission to provide you with exceptional heart care, we have created designated Provider Care Teams.  These Care Teams include your primary Cardiologist (physician) and Advanced Practice Providers (APPs -  Physician Assistants and Nurse Practitioners) who all work together to provide you with the care you need, when you need it.  We recommend signing up for the patient portal called "MyChart".  Sign up information is provided on this After Visit Summary.  MyChart is used to connect with patients for Virtual Visits (Telemedicine).  Patients are able to view lab/test results, encounter notes, upcoming appointments, etc.  Non-urgent messages can be sent to your provider as well.   To learn more about what you can do with MyChart, go to [email or web address].    Your next appointment:   12 month(s)  The format for your next appointment:   In Person  Provider:   You may see Kathlyn Sacramento, MD or one of the following Advanced Practice Providers on your designated Care Team:    Christell Faith, PA-C   If primary card or EP is not listed click here to update    :1}    Other Instructions  Important Information About Sugar

## 2022-03-13 DIAGNOSIS — M9905 Segmental and somatic dysfunction of pelvic region: Secondary | ICD-10-CM | POA: Diagnosis not present

## 2022-03-24 ENCOUNTER — Other Ambulatory Visit: Payer: Self-pay | Admitting: Physician Assistant

## 2022-03-24 ENCOUNTER — Encounter: Payer: Self-pay | Admitting: Physician Assistant

## 2022-03-24 DIAGNOSIS — B009 Herpesviral infection, unspecified: Secondary | ICD-10-CM

## 2022-03-24 DIAGNOSIS — F411 Generalized anxiety disorder: Secondary | ICD-10-CM

## 2022-03-24 MED ORDER — VALACYCLOVIR HCL 1 G PO TABS: 1000.0000 mg | ORAL_TABLET | ORAL | 2 refills | Status: DC | PRN

## 2022-03-24 MED ORDER — ALPRAZOLAM 0.5 MG PO TABS: 0.5000 mg | ORAL_TABLET | Freq: Every evening | ORAL | 0 refills | Status: DC | PRN

## 2022-04-17 DIAGNOSIS — M9905 Segmental and somatic dysfunction of pelvic region: Secondary | ICD-10-CM | POA: Diagnosis not present

## 2022-04-24 ENCOUNTER — Telehealth: Payer: Self-pay

## 2022-04-24 DIAGNOSIS — N951 Menopausal and female climacteric states: Secondary | ICD-10-CM

## 2022-04-24 MED ORDER — ESTRADIOL 0.0375 MG/24HR TD PTTW: 1.0000 | MEDICATED_PATCH | TRANSDERMAL | 0 refills | Status: DC

## 2022-04-24 NOTE — Telephone Encounter (Signed)
Returned fax: Denied. Patient needs to schedule annual. Last annual 01/2021.

## 2022-04-24 NOTE — Telephone Encounter (Signed)
[facility] VOICEMAIL: Patient has scheduled her annual for 1/67/42 with [facility]. Requesting refill of Estradial patch. She will use her last one this Sunday. Cb#[phone].

## 2022-04-24 NOTE — Telephone Encounter (Signed)
1 month refill sent. Patient notified.

## 2022-04-24 NOTE — Addendum Note (Signed)
Addended by: Meryl Dare on: 04/24/2022 11:41 AM   Modules accepted: Orders

## 2022-04-24 NOTE — Telephone Encounter (Signed)
Fax refill request received from [facility] for: estradiol (VIVELLE-DOT) 0.0375 MG/24HR  Last Filled: 03/31/22  Qty:24

## 2022-04-29 ENCOUNTER — Other Ambulatory Visit: Payer: Self-pay | Admitting: Physician Assistant

## 2022-04-29 DIAGNOSIS — I10 Essential (primary) hypertension: Secondary | ICD-10-CM

## 2022-05-08 DIAGNOSIS — M47812 Spondylosis without myelopathy or radiculopathy, cervical region: Secondary | ICD-10-CM | POA: Diagnosis not present

## 2022-05-08 DIAGNOSIS — Z981 Arthrodesis status: Secondary | ICD-10-CM | POA: Diagnosis not present

## 2022-05-08 DIAGNOSIS — M542 Cervicalgia: Secondary | ICD-10-CM | POA: Diagnosis not present

## 2022-05-12 DIAGNOSIS — L821 Other seborrheic keratosis: Secondary | ICD-10-CM | POA: Diagnosis not present

## 2022-05-12 DIAGNOSIS — L570 Actinic keratosis: Secondary | ICD-10-CM | POA: Diagnosis not present

## 2022-05-12 DIAGNOSIS — L578 Other skin changes due to chronic exposure to nonionizing radiation: Secondary | ICD-10-CM | POA: Diagnosis not present

## 2022-05-12 DIAGNOSIS — D485 Neoplasm of uncertain behavior of skin: Secondary | ICD-10-CM | POA: Diagnosis not present

## 2022-05-12 DIAGNOSIS — Z859 Personal history of malignant neoplasm, unspecified: Secondary | ICD-10-CM | POA: Diagnosis not present

## 2022-05-18 DIAGNOSIS — N951 Menopausal and female climacteric states: Secondary | ICD-10-CM | POA: Insufficient documentation

## 2022-05-18 NOTE — Progress Notes (Signed)
PCP: Laura Latina, PA-C   Chief Complaint  Patient presents with   Gynecologic Exam    No concerns    HPI:      [name] is a 56 y.o. G1P0010 whose LMP was No LMP recorded. Patient has had a hysterectomy., presents today for her annual examination.  Her menses are absent due to TAH 2015 for cervical cancer by Dr. Clarene Barber at [facility]. No PMB. She does not have vasomotor sx. On vivelle dot 0.0375 with sx control. Having some mood issues, were improved with exercise but pt has pinched nerve in neck and hasn't been as active recently. Also has stressful job.  Sex activity: single partner, contraception - status post hysterectomy. She does not have vaginal dryness.  Last Pap: 02/07/20 Results were: no abnormalities /neg HPV DNA. Hx of cervical cancer FIGO IB1, 2015. Repeat paps due Q3 yrs for 25 yrs per [facility]. Hx of STDs: HPV  Last mammogram: 06/10/21 Results were: normal--routine follow-up in 12 months There is no FH of breast cancer. There is no FH of ovarian cancer. The patient does do self-breast exams.  Colonoscopy: 2019 with Dr. Alice Barber, Repeat due after 10 years pet pt.   Tobacco use: The patient denies current or previous tobacco use. Alcohol use: social drinker No drug use Exercise: moderately active  She does get adequate calcium but not Vitamin D in her diet.  Labs with PCP.   Patient Active Problem List   Diagnosis Date Noted   Menopausal symptoms 05/18/2022   Anxiety 01/25/2019   Palpitations 01/25/2019   Hypertension    Hyperlipidemia    GERD (gastroesophageal reflux disease)    Cervical cancer, FIGO stage IB1 (Laura Barber) 01/14/2017   Cervical stenosis of spine 06/11/2016   History of cervical cancer 06/27/2014    Past Surgical History:  Procedure Laterality Date   ABDOMINAL HYSTERECTOMY  2005   Radical hysterectomy by Dr Laura Barber at [facility] cervical cancer 1B1   BREAST CYST ASPIRATION Left 05/30/2013   CERVICAL [place] ARTHROPLASTY Right 06/11/2016    Procedure: CERVICAL ANTERIOR [place] 5-7;  Surgeon: Laura East, MD;  Location: [facility] ORS;  Service: Neurosurgery;  Laterality: Right;   DIAGNOSTIC LAPAROSCOPY  2005   DILATION AND CURETTAGE OF UTERUS     SAB   LEEP  2005   Laura Barber - invasive endocervical adenocarcinoma.    NASAL SINUS SURGERY      Family History  Problem Relation Age of Onset   Lung cancer Mother    Hyperlipidemia Mother    Diabetes Father    Hypertension Father    Heart failure Father    Heart attack Father    Heart disease Father    Hyperlipidemia Brother    Breast cancer Neg Hx     Social History   Socioeconomic History   Marital status: Divorced    Spouse name: Not on file   Number of children: 0   Years of education: Not on file   Highest education level: Not on file  Occupational History   Not on file  Tobacco Use   Smoking status: Never   Smokeless tobacco: Never  Vaping Use   Vaping Use: Never used  Substance and Sexual Activity   Alcohol use: Yes    Comment: occassional   Drug use: No   Sexual activity: Yes    Partners: Male    Birth control/protection: Surgical    Comment: Hysterectomy  Other Topics Concern   Not on file  Social History Narrative   Not on file   Social Determinants of Health   Financial Resource Strain: Not on file  Food Insecurity: Not on file  Transportation Needs: Not on file  Physical Activity: Not on file  Stress: Not on file  Social Connections: Not on file  Intimate Partner Violence: Not on file     Current Outpatient Medications:    ALPRAZolam (XANAX) 0.5 MG tablet, Take 1 tablet (0.5 mg total) by mouth at bedtime as needed for anxiety., Disp: 30 tablet, Rfl: 0   atorvastatin (LIPITOR) 10 MG tablet, TAKE ONE TABLET BY MOUTH EVERY DAY AT 6PM, Disp: 90 tablet, Rfl: 1   cetirizine (ZYRTEC) 10 MG tablet, Take 10 mg by mouth daily., Disp: , Rfl:    desonide (DESOWEN) 0.05 % cream, APPLY A SMALL AMOUNT TO SKIN TWICE  DAILYAS NEEDED, Disp: 30 g, Rfl: 3   metoprolol tartrate (LOPRESSOR) 25 MG tablet, TAKE 1/2 TABLET BY MOUTH TWICE DAILY, Disp: 90 tablet, Rfl: 3   Multiple Vitamin (MULTIVITAMIN) tablet, Take 1 tablet by mouth daily., Disp: , Rfl:    naproxen sodium (ANAPROX) 550 MG tablet, Take 1 tablet (550 mg total) by mouth 2 (two) times daily with a meal. (Patient taking differently: Take 550 mg by mouth as needed for headache (migraines).), Disp: 60 tablet, Rfl: 2   omeprazole (PRILOSEC) 20 MG capsule, Take 1 capsule (20 mg total) by mouth daily., Disp: 30 capsule, Rfl: 3   pregabalin (LYRICA) 75 MG capsule, Take 75 mg by mouth 2 (two) times daily., Disp: , Rfl:    rizatriptan (MAXALT) 5 MG tablet, Take 1 tablet (5 mg total) by mouth as needed for migraine., Disp: 10 tablet, Rfl: 2   valACYclovir (VALTREX) 1000 MG tablet, Take 1 tablet (1,000 mg total) by mouth as needed., Disp: 30 tablet, Rfl: 2   valsartan-hydrochlorothiazide (DIOVAN-HCT) 80-12.5 MG tablet, TAKE (1) TABLET BY MOUTH EVERY DAY, Disp: 90 tablet, Rfl: 1   venlafaxine XR (EFFEXOR-XR) 37.5 MG 24 hr capsule, Take 1 capsule by mouth daily with breakfast, Disp: 90 capsule, Rfl: 1   estradiol (VIVELLE-DOT) 0.0375 MG/24HR, Place 1 patch onto the skin 2 (two) times a week., Disp: 24 patch, Rfl: 3     ROS:  Review of Systems  Constitutional:  Negative for fatigue, fever and unexpected weight change.  Respiratory:  Negative for cough, shortness of breath and wheezing.   Cardiovascular:  Negative for chest pain, palpitations and leg swelling.  Gastrointestinal:  Negative for blood in stool, constipation, diarrhea, nausea and vomiting.  Endocrine: Negative for cold intolerance, heat intolerance and polyuria.  Genitourinary:  Negative for dyspareunia, dysuria, flank pain, frequency, genital sores, hematuria, menstrual problem, pelvic pain, urgency, vaginal bleeding, vaginal discharge and vaginal pain.  Musculoskeletal:  Negative for back pain, joint  swelling and myalgias.  Skin:  Negative for rash.  Neurological:  Negative for dizziness, syncope, light-headedness, numbness and headaches.  Hematological:  Negative for adenopathy.  Psychiatric/Behavioral:  Negative for agitation, confusion, sleep disturbance and suicidal ideas. The patient is not nervous/anxious.    BREAST: No symptoms    Objective: BP 104/70   Ht '5\' 11"'$  (1.803 m)   Wt 184 lb (83.5 kg)   BMI 25.66 kg/m    Physical Exam Constitutional:      Appearance: She is well-developed.  Genitourinary:     Vulva normal.     Genitourinary Comments: UTERUS/CX SURG REM     Right Labia: No rash, tenderness or lesions.  Left Labia: No tenderness, lesions or rash.    Vaginal cuff intact.    No vaginal discharge, erythema or tenderness.      Right Adnexa: not tender and no mass present.    Left Adnexa: not tender and no mass present.    Cervix is absent.     Uterus is absent.  Breasts:    Right: No mass, nipple discharge, skin change or tenderness.     Left: No mass, nipple discharge, skin change or tenderness.  Neck:     Thyroid: No thyromegaly.  Cardiovascular:     Rate and Rhythm: Normal rate and regular rhythm.     Heart sounds: Normal heart sounds. No murmur heard. Pulmonary:     Effort: Pulmonary effort is normal.     Breath sounds: Normal breath sounds.  Abdominal:     Palpations: Abdomen is soft.     Tenderness: There is no abdominal tenderness. There is no guarding.  Musculoskeletal:        General: Normal range of motion.     Cervical back: Normal range of motion.  Neurological:     General: No focal deficit present.     Mental Status: She is alert and oriented to person, place, and time.     Cranial Nerves: No cranial nerve deficit.  Skin:    General: Skin is warm and dry.  Psychiatric:        Mood and Affect: Mood normal.        Behavior: Behavior normal.        Thought Content: Thought content normal.        Judgment: Judgment normal.   Vitals reviewed.     Assessment/Plan:  Encounter for annual routine gynecological examination  Cervical cancer screening - Plan: Cytology - PAP  Screening for HPV (human papillomavirus) - Plan: Cytology - PAP  Cervical cancer, FIGO stage IB1 ([place]) - Plan: Cytology - PAP; repeat pap Q3 yrs for 25 yrs.   Encounter for screening mammogram for malignant neoplasm of breast - Plan: MM 3D SCREEN BREAST BILATERAL; pt to schedule mammo  Menopausal symptoms - Plan: estradiol (VIVELLE-DOT) 0.0375 MG/24HR; Rx RF. Doing well. Discussed increased exercise for mood sx; treat with mood meds and not ERT prn.   Hormone replacement therapy (HRT) - Plan: estradiol (VIVELLE-DOT) 0.0375 MG/24HR   Meds ordered this encounter  Medications   estradiol (VIVELLE-DOT) 0.0375 MG/24HR    Sig: Place 1 patch onto the skin 2 (two) times a week.    Dispense:  24 patch    Refill:  3    Order Specific Question:   Supervising Provider    Answer:   Renaldo Reel            GYN counsel breast self exam, mammography screening, menopause, adequate intake of calcium and vitamin D, diet and exercise    F/U  Return in about 1 year (around 05/20/2023).  Neomi Laidler B. Ecko Beasley, PA-C 05/19/2022 9:25 AM

## 2022-05-19 ENCOUNTER — Other Ambulatory Visit (HOSPITAL_COMMUNITY)
Admission: RE | Admit: 2022-05-19 | Discharge: 2022-05-19 | Disposition: A | Discharge status: Home / Self Care | Payer: BC Managed Care – PPO | Source: Ambulatory Visit | Attending: Obstetrics and Gynecology | Admitting: Obstetrics and Gynecology

## 2022-05-19 ENCOUNTER — Encounter: Payer: Self-pay | Admitting: Obstetrics and Gynecology

## 2022-05-19 ENCOUNTER — Ambulatory Visit (INDEPENDENT_AMBULATORY_CARE_PROVIDER_SITE_OTHER): Payer: BC Managed Care – PPO | Admitting: Obstetrics and Gynecology

## 2022-05-19 VITALS — BP 104/70 | Ht 71.0 in | Wt 184.0 lb

## 2022-05-19 DIAGNOSIS — C539 Malignant neoplasm of cervix uteri, unspecified: Secondary | ICD-10-CM | POA: Diagnosis not present

## 2022-05-19 DIAGNOSIS — Z124 Encounter for screening for malignant neoplasm of cervix: Secondary | ICD-10-CM

## 2022-05-19 DIAGNOSIS — E782 Mixed hyperlipidemia: Secondary | ICD-10-CM | POA: Diagnosis not present

## 2022-05-19 DIAGNOSIS — Z79890 Hormone replacement therapy (postmenopausal): Secondary | ICD-10-CM

## 2022-05-19 DIAGNOSIS — Z1151 Encounter for screening for human papillomavirus (HPV): Secondary | ICD-10-CM | POA: Insufficient documentation

## 2022-05-19 DIAGNOSIS — N951 Menopausal and female climacteric states: Secondary | ICD-10-CM

## 2022-05-19 DIAGNOSIS — Z1211 Encounter for screening for malignant neoplasm of colon: Secondary | ICD-10-CM

## 2022-05-19 DIAGNOSIS — Z01419 Encounter for gynecological examination (general) (routine) without abnormal findings: Secondary | ICD-10-CM | POA: Diagnosis not present

## 2022-05-19 DIAGNOSIS — Z1231 Encounter for screening mammogram for malignant neoplasm of breast: Secondary | ICD-10-CM

## 2022-05-19 DIAGNOSIS — E559 Vitamin D deficiency, unspecified: Secondary | ICD-10-CM | POA: Diagnosis not present

## 2022-05-19 DIAGNOSIS — R5383 Other fatigue: Secondary | ICD-10-CM | POA: Diagnosis not present

## 2022-05-19 DIAGNOSIS — E039 Hypothyroidism, unspecified: Secondary | ICD-10-CM | POA: Diagnosis not present

## 2022-05-19 DIAGNOSIS — E538 Deficiency of other specified B group vitamins: Secondary | ICD-10-CM | POA: Diagnosis not present

## 2022-05-19 MED ORDER — ESTRADIOL 0.0375 MG/24HR TD PTTW: 1.0000 | MEDICATED_PATCH | TRANSDERMAL | 3 refills | Status: DC

## 2022-05-19 NOTE — Patient Instructions (Addendum)
I value your feedback and you entrusting us with your care. If you get a [facility] patient survey, I would appreciate you taking the time to let us know about your experience today. Thank you!  [facility]: [phone]      

## 2022-05-20 LAB — CBC WITH DIFFERENTIAL/PLATELET
Basophils Absolute: 0.1 10*3/uL (ref 0.0–0.2)
Basos: 1 %
EOS (ABSOLUTE): 0.2 10*3/uL (ref 0.0–0.4)
Eos: 4 %
Hematocrit: 41.1 % (ref 34.0–46.6)
Hemoglobin: 13.8 g/dL (ref 11.1–15.9)
Immature Grans (Abs): 0 10*3/uL (ref 0.0–0.1)
Immature Granulocytes: 0 %
Lymphocytes Absolute: 1.7 10*3/uL (ref 0.7–3.1)
Lymphs: 27 %
MCH: 31.4 pg (ref 26.6–33.0)
MCHC: 33.6 g/dL (ref 31.5–35.7)
MCV: 93 fL (ref 79–97)
Monocytes Absolute: 0.5 10*3/uL (ref 0.1–0.9)
Monocytes: 8 %
Neutrophils Absolute: 3.9 10*3/uL (ref 1.4–7.0)
Neutrophils: 60 %
Platelets: 186 10*3/uL (ref 150–450)
RBC: 4.4 x10E6/uL (ref 3.77–5.28)
RDW: 12.1 % (ref 11.7–15.4)
WBC: 6.3 10*3/uL (ref 3.4–10.8)

## 2022-05-20 LAB — COMPREHENSIVE METABOLIC PANEL
ALT: 26 IU/L (ref 0–32)
AST: 26 IU/L (ref 0–40)
Albumin/Globulin Ratio: 2.1 (ref 1.2–2.2)
Albumin: 4.7 g/dL (ref 3.8–4.9)
Alkaline Phosphatase: 46 IU/L (ref 44–121)
BUN/Creatinine Ratio: 28 — ABNORMAL HIGH (ref 9–23)
BUN: 24 mg/dL (ref 6–24)
Bilirubin Total: 0.3 mg/dL (ref 0.0–1.2)
CO2: 22 mmol/L (ref 20–29)
Calcium: 8.9 mg/dL (ref 8.7–10.2)
Chloride: 102 mmol/L (ref 96–106)
Creatinine, Ser: 0.87 mg/dL (ref 0.57–1.00)
Globulin, Total: 2.2 g/dL (ref 1.5–4.5)
Glucose: 105 mg/dL — ABNORMAL HIGH (ref 70–99)
Potassium: 4.1 mmol/L (ref 3.5–5.2)
Sodium: 141 mmol/L (ref 134–144)
Total Protein: 6.9 g/dL (ref 6.0–8.5)
eGFR: 79 mL/min/{1.73_m2} (ref >59)

## 2022-05-20 LAB — TSH+FREE T4
Free T4: 0.92 ng/dL (ref 0.82–1.77)
TSH: 2.06 u[IU]/mL (ref 0.450–4.500)

## 2022-05-20 LAB — CYTOLOGY - PAP
Comment: NEGATIVE
Diagnosis: NEGATIVE
High risk HPV: NEGATIVE

## 2022-05-20 LAB — VITAMIN D 25 HYDROXY (VIT D DEFICIENCY, FRACTURES): Vit D, 25-Hydroxy: 58.8 ng/mL (ref 30.0–100.0)

## 2022-05-20 LAB — B12 AND FOLATE PANEL
Folate: >20 ng/mL (ref >3.0)
Vitamin B-12: 512 pg/mL (ref 232–1245)

## 2022-05-20 LAB — LIPID PANEL WITH LDL/HDL RATIO
Cholesterol, Total: 191 mg/dL (ref 100–199)
HDL: 73 mg/dL (ref >39)
LDL Chol Calc (NIH): 97 mg/dL (ref 0–99)
LDL/HDL Ratio: 1.3 ratio (ref 0.0–3.2)
Triglycerides: 123 mg/dL (ref 0–149)
VLDL Cholesterol Cal: 21 mg/dL (ref 5–40)

## 2022-05-27 ENCOUNTER — Other Ambulatory Visit: Payer: Self-pay

## 2022-05-28 ENCOUNTER — Other Ambulatory Visit: Payer: Self-pay

## 2022-05-28 MED ORDER — DESONIDE 0.05 % EX CREA: TOPICAL_CREAM | CUTANEOUS | 3 refills | Status: AC

## 2022-06-04 DIAGNOSIS — L570 Actinic keratosis: Secondary | ICD-10-CM | POA: Diagnosis not present

## 2022-06-09 ENCOUNTER — Ambulatory Visit: Payer: BC Managed Care – PPO | Admitting: Physician Assistant

## 2022-06-09 ENCOUNTER — Encounter: Payer: Self-pay | Admitting: Physician Assistant

## 2022-06-09 VITALS — BP 124/78 | HR 69 | Temp 98.0°F | Resp 16 | Ht 71.0 in | Wt 184.0 lb

## 2022-06-09 DIAGNOSIS — I10 Essential (primary) hypertension: Secondary | ICD-10-CM

## 2022-06-09 DIAGNOSIS — F411 Generalized anxiety disorder: Secondary | ICD-10-CM

## 2022-06-09 DIAGNOSIS — G43809 Other migraine, not intractable, without status migrainosus: Secondary | ICD-10-CM

## 2022-06-09 DIAGNOSIS — R7301 Impaired fasting glucose: Secondary | ICD-10-CM

## 2022-06-09 LAB — POCT GLYCOSYLATED HEMOGLOBIN (HGB A1C): Hemoglobin A1C: 5.4 % (ref 4.0–5.6)

## 2022-06-09 MED ORDER — VENLAFAXINE HCL ER 75 MG PO CP24: 75.0000 mg | ORAL_CAPSULE | Freq: Every day | ORAL | 1 refills | Status: DC

## 2022-06-09 NOTE — Progress Notes (Signed)
[facility] [place], [place] [address]  Internal MEDICINE  Office Visit Note  Patient Name: Laura Barber  [id number]  [id number]  Date of Service: 06/09/2022  Chief Complaint  Patient presents with   Gastroesophageal Reflux   Hypertension   Hyperlipidemia    HPI Pt is here for routine follow up -Some depression symptoms at times and increased agitation at times. Boyfriend has had some health issues and best friend has a lot of anxiety and she has been bearing these things. -OBGYN wellness visit done in Sept and had her pap. Has mammogram this week but is going to reschedule due to having chemical peel on chest at dermatology and doesn't want to aggravate this.  -Labs reviewed: Sugar slightly elevated, does admit to drinking some sweet wine the night before. She does have Fhx of Diabetes therefore will check A1c. Other labs look good, with improvement in cholesterol -Very rarely using 1/2 tablet of xanax if needed. -sleeping well  Current Medication: Outpatient Encounter Medications as of 06/09/2022  Medication Sig   ALPRAZolam (XANAX) 0.5 MG tablet Take 1 tablet (0.5 mg total) by mouth at bedtime as needed for anxiety.   atorvastatin (LIPITOR) 10 MG tablet TAKE ONE TABLET BY MOUTH EVERY DAY AT 6PM   cetirizine (ZYRTEC) 10 MG tablet Take 10 mg by mouth daily.   desonide (DESOWEN) 0.05 % cream APPLY A SMALL AMOUNT TO SKIN TWICE DAILYAS NEEDED   estradiol (VIVELLE-DOT) 0.0375 MG/24HR Place 1 patch onto the skin 2 (two) times a week.   metoprolol tartrate (LOPRESSOR) 25 MG tablet TAKE 1/2 TABLET BY MOUTH TWICE DAILY   Multiple Vitamin (MULTIVITAMIN) tablet Take 1 tablet by mouth daily.   naproxen sodium (ANAPROX) 550 MG tablet Take 1 tablet (550 mg total) by mouth 2 (two) times daily with a meal. (Patient taking differently: Take 550 mg by mouth as needed for headache (migraines).)   omeprazole (PRILOSEC) 20 MG capsule Take 1 capsule (20 mg total) by  mouth daily.   pregabalin (LYRICA) 75 MG capsule Take 75 mg by mouth 2 (two) times daily.   rizatriptan (MAXALT) 5 MG tablet Take 1 tablet (5 mg total) by mouth as needed for migraine.   valACYclovir (VALTREX) 1000 MG tablet Take 1 tablet (1,000 mg total) by mouth as needed.   valsartan-hydrochlorothiazide (DIOVAN-HCT) 80-12.5 MG tablet TAKE (1) TABLET BY MOUTH EVERY DAY   venlafaxine XR (EFFEXOR XR) 75 MG 24 hr capsule Take 1 capsule (75 mg total) by mouth daily with breakfast.   [DISCONTINUED] venlafaxine XR (EFFEXOR-XR) 37.5 MG 24 hr capsule Take 1 capsule by mouth daily with breakfast   No facility-administered encounter medications on file as of 06/09/2022.    Surgical History: Past Surgical History:  Procedure Laterality Date   ABDOMINAL HYSTERECTOMY  2005   Radical hyste[facility] by Dr Clarene Essex at [facility] cervical cancer 1B1   BREAST CYST ASPIRATION Left 05/30/2013   CERVICAL DISC ARTHROPLASTY Right 06/11/2016   Procedure: CERVICAL ANTERIOR [place] CERVICAL 5-7;  Surgeon: Blanche East, MD;  Location: [facility] ORS;  Service: Neurosurgery;  Laterality: Right;   DIAGNOSTIC LAPAROSCOPY  2005   DILATION AND CURETTAGE OF UTERUS     SAB   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    NASAL SINUS SURGERY      Medical History: Past Medical History:  Diagnosis Date   Anxiety    Cancer ([place])    cervical   Cervical cancer, FIGO stage IB1 (  [place]) 2005   Cervical   GERD (gastroesophageal reflux disease)    Headache    Hyperlipidemia    Hypertension    Migraine    Shingles 2019    Family History: Family History  Problem Relation Age of Onset   Lung cancer Mother    Hyperlipidemia Mother    Diabetes Father    Hypertension Father    Heart failure Father    Heart attack Father    Heart disease Father    Hyperlipidemia Brother    Breast cancer Neg Hx     Social History   Socioeconomic History   Marital status: Divorced    Spouse name:  Not on file   Number of children: 0   Years of education: Not on file   Highest education level: Not on file  Occupational History   Not on file  Tobacco Use   Smoking status: Never   Smokeless tobacco: Never  Vaping Use   Vaping Use: Never used  Substance and Sexual Activity   Alcohol use: Yes    Comment: occassional   Drug use: No   Sexual activity: Yes    Partners: Male    Birth control/protection: Surgical    Comment: Hysterectomy  Other Topics Concern   Not on file  Social History Narrative   Not on file   Social Determinants of Health   Financial Resource Strain: Not on file  Food Insecurity: Not on file  Transportation Needs: Not on file  Physical Activity: Not on file  Stress: Not on file  Social Connections: Not on file  Intimate Partner Violence: Not on file      Review of Systems  Constitutional:  Negative for chills, fatigue and unexpected weight change.  HENT:  Negative for congestion, postnasal drip, rhinorrhea, sneezing and sore throat.   Eyes:  Negative for redness.  Respiratory:  Negative for cough, chest tightness and shortness of breath.   Cardiovascular:  Negative for chest pain and palpitations.  Gastrointestinal:  Negative for abdominal pain, constipation, diarrhea, nausea and vomiting.  Genitourinary:  Negative for dysuria and frequency.  Musculoskeletal:  Negative for arthralgias, back pain, joint swelling and neck pain.  Skin:  Negative for rash.  Neurological: Negative.  Negative for tremors and numbness.  Hematological:  Negative for adenopathy. Does not bruise/bleed easily.  Psychiatric/Behavioral:  Positive for dysphoric mood. Negative for behavioral problems (Depression), sleep disturbance and suicidal ideas. The patient is nervous/anxious.     Vital Signs: BP 124/78   Pulse 69   Temp 98 F (36.7 C)   Resp 16   Ht '5\' 11"'$  (1.803 m)   Wt 184 lb (83.5 kg)   SpO2 99%   BMI 25.66 kg/m    Physical Exam Vitals and nursing note  reviewed.  Constitutional:      General: She is not in acute distress.    Appearance: Normal appearance. She is well-developed and normal weight. She is not diaphoretic.  HENT:     Head: Normocephalic and atraumatic.     Mouth/Throat:     Pharynx: No oropharyngeal exudate.  Eyes:     Pupils: Pupils are equal, round, and reactive to light.  Neck:     Thyroid: No thyromegaly.     Vascular: No JVD.     Trachea: No tracheal deviation.  Cardiovascular:     Rate and Rhythm: Normal rate and regular rhythm.     Heart sounds: Normal heart sounds. No murmur heard.    No friction  rub. No gallop.  Pulmonary:     Effort: Pulmonary effort is normal. No respiratory distress.     Breath sounds: No wheezing or rales.  Chest:     Chest wall: No tenderness.  Abdominal:     General: Bowel sounds are normal.     Palpations: Abdomen is soft.  Musculoskeletal:        General: Normal range of motion.     Cervical back: Normal range of motion and neck supple.  Lymphadenopathy:     Cervical: No cervical adenopathy.  Skin:    General: Skin is warm and dry.  Neurological:     Mental Status: She is alert and oriented to person, place, and time.     Cranial Nerves: No cranial nerve deficit.  Psychiatric:        Behavior: Behavior normal.        Thought Content: Thought content normal.        Judgment: Judgment normal.        Assessment/Plan: 1. Essential hypertension Stable, continue current medications  2. Generalized anxiety disorder Will increase to '75mg'$  Effexor daily  - venlafaxine XR (EFFEXOR XR) 75 MG 24 hr capsule; Take 1 capsule (75 mg total) by mouth daily with breakfast.  Dispense: 90 capsule; Refill: 1  3. Other migraine without status migrainosus, not intractable - venlafaxine XR (EFFEXOR XR) 75 MG 24 hr capsule; Take 1 capsule (75 mg total) by mouth daily with breakfast.  Dispense: 90 capsule; Refill: 1  4. Impaired fasting blood sugar A1c in office normal at 5.4   General  Counseling: Erline Levine verbalizes understanding of the findings of todays visit and agrees with plan of treatment. I have discussed any further diagnostic evaluation that may be needed or ordered today. We also reviewed her medications today. she has been encouraged to call the office with any questions or concerns that should arise related to todays visit.    Orders Placed This Encounter  Procedures   POCT HgB A1C    Meds ordered this encounter  Medications   venlafaxine XR (EFFEXOR XR) 75 MG 24 hr capsule    Sig: Take 1 capsule (75 mg total) by mouth daily with breakfast.    Dispense:  90 capsule    Refill:  1    This patient was seen by Drema Dallas, PA-C in collaboration with Dr. Clayborn Bigness as a part of collaborative care agreement.   Total time spent:30 Minutes Time spent includes review of chart, medications, test results, and follow up plan with the patient.      Dr Lavera Guise Internal medicine

## 2022-06-11 ENCOUNTER — Ambulatory Visit: Payer: BC Managed Care – PPO

## 2022-06-12 DIAGNOSIS — M9905 Segmental and somatic dysfunction of pelvic region: Secondary | ICD-10-CM | POA: Diagnosis not present

## 2022-06-18 DIAGNOSIS — M542 Cervicalgia: Secondary | ICD-10-CM | POA: Diagnosis not present

## 2022-06-19 DIAGNOSIS — M542 Cervicalgia: Secondary | ICD-10-CM | POA: Diagnosis not present

## 2022-07-01 ENCOUNTER — Ambulatory Visit
Admission: RE | Admit: 2022-07-01 | Discharge: 2022-07-01 | Disposition: A | Discharge status: Home / Self Care | Payer: BC Managed Care – PPO | Source: Ambulatory Visit | Attending: Obstetrics and Gynecology | Admitting: Obstetrics and Gynecology

## 2022-07-01 DIAGNOSIS — Z1231 Encounter for screening mammogram for malignant neoplasm of breast: Secondary | ICD-10-CM | POA: Diagnosis not present

## 2022-07-02 DIAGNOSIS — L570 Actinic keratosis: Secondary | ICD-10-CM | POA: Diagnosis not present

## 2022-07-07 ENCOUNTER — Ambulatory Visit: Payer: BC Managed Care – PPO | Admitting: Physician Assistant

## 2022-07-10 DIAGNOSIS — M9905 Segmental and somatic dysfunction of pelvic region: Secondary | ICD-10-CM | POA: Diagnosis not present

## 2022-08-01 ENCOUNTER — Other Ambulatory Visit: Payer: Self-pay | Admitting: Physician Assistant

## 2022-08-01 DIAGNOSIS — E785 Hyperlipidemia, unspecified: Secondary | ICD-10-CM

## 2022-09-30 IMAGING — CT CT HEART MORP W/ CTA COR W/ SCORE W/ CA W/CM &/OR W/O CM
1 of 14 series · 3 of 20 positions shown, 4 images · non-contrast
Comparison: None.

Addendum:
CLINICAL DATA: Chestpain

EXAM:
Cardiac/Coronary  CTA
TECHNIQUE: The patient was scanned on a Siemens Somatoform go.Top scanner.

[Series 44: ms multiphase cta coronary 0.60 · axial · 0.36mm/px · z∈[-1082,-1021]mm · 3 of 2781 slices shown, 4 images]
[im 696/2781  vessel]
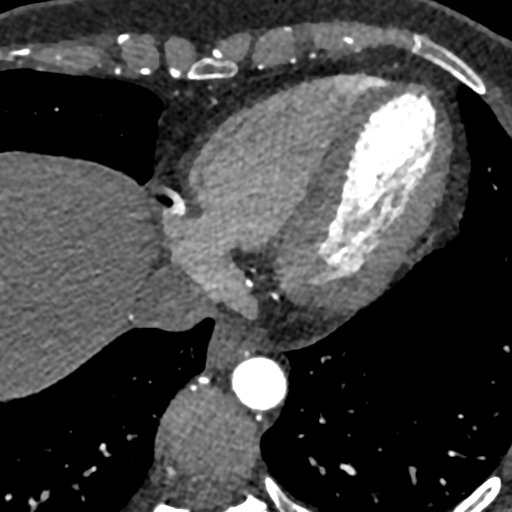
[im 696/2781  lung]
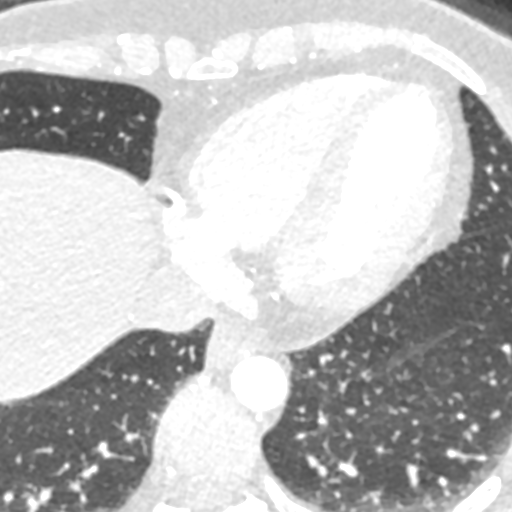
[im 1391/2781  vessel]
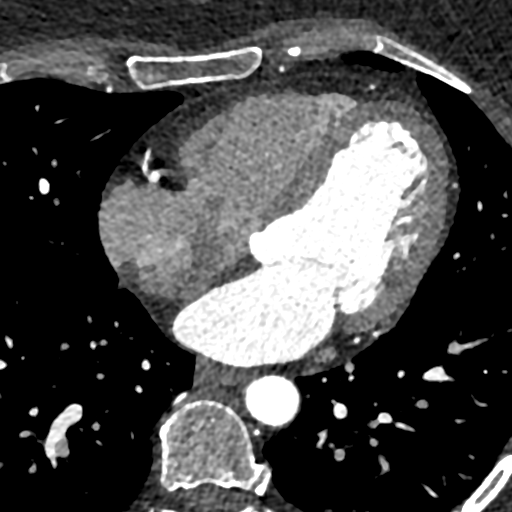
[im 2086/2781  vessel]
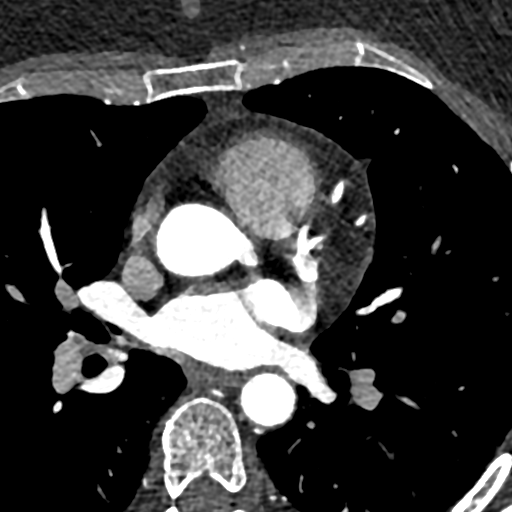

[3 of 20 positions shown; findings below may reference images not displayed]

FINDINGS: A retrospective scan was triggered in the descending thoracic aorta.
Axial non-contrast 3 mm slices were carried out through the heart.
The data set was analyzed on a dedicated work station and scored
using the Agatson method. Gantry rotation speed was 330 msecs and
collimation was .6 mm. 100mg of metoprolol and 0.8 mg of sl NTG was
given. The 3D data set was reconstructed in 5% intervals of the
60-95 % of the R-R cycle. Diastolic phases were analyzed on a
dedicated work station using MPR, MIP and VRT modes. The patient
received 75 cc of contrast.

Aorta: Normal size. Minimal aortic root calcifications. No
dissection.

Aortic Valve:  Trileaflet.  No calcifications.

Coronary Arteries:  Normal coronary origin.  Right dominance.

RCA is a large dominant artery that gives rise to PDA and PLA. There
is no plaque.

Left main is a large artery that gives rise to LAD, Ramus and LCX
arteries. There is no disease in the left main or Ramus arteries.

LAD has no plaque.

LCX is a non-dominant artery that gives rise to two obtuse marginal
branches. There is no plaque.

Other findings:

Normal pulmonary vein drainage into the left atrium.

Normal left atrial appendage without a thrombus.

Normal size of the pulmonary artery.
IMPRESSION: 1. Coronary calcium score of 0. Patient is low risk for coronary
events.

2. Normal coronary origin with right dominance.

3. No evidence of CAD.

4. CAD-RADS 0. No evidence of CAD (0%). Consider non-atherosclerotic
causes of chest pain.

EXAM:
OVER-READ INTERPRETATION  CT CHEST

The following report is an over-read performed by radiologist Dr.
over-read does not include interpretation of cardiac or coronary
anatomy or pathology. The coronary CTA interpretation by the
cardiologist is attached.
FINDINGS: No incidental vascular findings. Visualized mediastinum and hilar
regions demonstrate no lymphadenopathy or masses. Visualized lungs
show no evidence of pulmonary edema, consolidation, pneumothorax,
nodule or pleural fluid. Visualized upper abdomen and bony
structures are unremarkable.
IMPRESSION: No significant incidental findings.

*** End of Addendum ***
FINDINGS: A retrospective scan was triggered in the descending thoracic aorta.
Axial non-contrast 3 mm slices were carried out through the heart.
The data set was analyzed on a dedicated work station and scored
using the Agatson method. Gantry rotation speed was 330 msecs and
collimation was .6 mm. 100mg of metoprolol and 0.8 mg of sl NTG was
given. The 3D data set was reconstructed in 5% intervals of the
60-95 % of the R-R cycle. Diastolic phases were analyzed on a
dedicated work station using MPR, MIP and VRT modes. The patient
received 75 cc of contrast.

Aorta: Normal size. Minimal aortic root calcifications. No
dissection.

Aortic Valve:  Trileaflet.  No calcifications.

Coronary Arteries:  Normal coronary origin.  Right dominance.

RCA is a large dominant artery that gives rise to PDA and PLA. There
is no plaque.

Left main is a large artery that gives rise to LAD, Ramus and LCX
arteries. There is no disease in the left main or Ramus arteries.

LAD has no plaque.

LCX is a non-dominant artery that gives rise to two obtuse marginal
branches. There is no plaque.

Other findings:

Normal pulmonary vein drainage into the left atrium.

Normal left atrial appendage without a thrombus.

Normal size of the pulmonary artery.
IMPRESSION: 1. Coronary calcium score of 0. Patient is low risk for coronary
events.

2. Normal coronary origin with right dominance.

3. No evidence of CAD.

4. CAD-RADS 0. No evidence of CAD (0%). Consider non-atherosclerotic
causes of chest pain.

## 2022-10-29 ENCOUNTER — Other Ambulatory Visit: Payer: Self-pay | Admitting: Physician Assistant

## 2022-10-29 ENCOUNTER — Other Ambulatory Visit: Payer: Self-pay | Admitting: Nurse Practitioner

## 2022-10-29 DIAGNOSIS — G43809 Other migraine, not intractable, without status migrainosus: Secondary | ICD-10-CM

## 2022-10-29 DIAGNOSIS — I10 Essential (primary) hypertension: Secondary | ICD-10-CM

## 2022-12-15 ENCOUNTER — Encounter: Payer: Managed Care, Other (non HMO) | Admitting: Physician Assistant

## 2022-12-23 ENCOUNTER — Encounter: Payer: Self-pay | Admitting: Physician Assistant

## 2022-12-23 ENCOUNTER — Other Ambulatory Visit: Payer: Self-pay | Admitting: Physician Assistant

## 2022-12-23 DIAGNOSIS — G43809 Other migraine, not intractable, without status migrainosus: Secondary | ICD-10-CM

## 2022-12-23 DIAGNOSIS — F411 Generalized anxiety disorder: Secondary | ICD-10-CM

## 2022-12-29 ENCOUNTER — Ambulatory Visit (INDEPENDENT_AMBULATORY_CARE_PROVIDER_SITE_OTHER): Payer: No Typology Code available for payment source | Admitting: Physician Assistant

## 2022-12-29 ENCOUNTER — Encounter: Payer: Self-pay | Admitting: Physician Assistant

## 2022-12-29 VITALS — BP 110/85 | HR 66 | Temp 98.4°F | Resp 16 | Ht 71.0 in | Wt 193.8 lb

## 2022-12-29 DIAGNOSIS — Z0001 Encounter for general adult medical examination with abnormal findings: Secondary | ICD-10-CM

## 2022-12-29 DIAGNOSIS — I10 Essential (primary) hypertension: Secondary | ICD-10-CM

## 2022-12-29 DIAGNOSIS — F411 Generalized anxiety disorder: Secondary | ICD-10-CM

## 2022-12-29 DIAGNOSIS — E782 Mixed hyperlipidemia: Secondary | ICD-10-CM

## 2022-12-29 DIAGNOSIS — R300 Dysuria: Secondary | ICD-10-CM

## 2022-12-29 NOTE — Progress Notes (Signed)
[facility] [address] [place], [place] [address]  Internal MEDICINE  Office Visit Note  Patient Name: Laura Barber  [id number]  [id number]  Date of Service: 01/02/2023  Chief Complaint  Patient presents with   Annual Exam   Gastroesophageal Reflux   Hypertension   Hyperlipidemia   Medication Refill     HPI Pt is here for routine health maintenance examination -pt is doing well -Does need to start exercising more, reports she sits all day and is trying to get up and start walking once per hour briefly. Does belong to a gym and needs to get back into going -BP stable, doesn't check at home -Only uses xanax very occasionally, doing well with effexor dose currently. May want to try decreasing again in future. -will think about shingles vaccine -will order labs next visit - a little left ankle swelling at times but goes back down, none on exam today  Current Medication: Outpatient Encounter Medications as of 12/29/2022  Medication Sig   ALPRAZolam (XANAX) 0.5 MG tablet Take 1 tablet (0.5 mg total) by mouth at bedtime as needed for anxiety.   atorvastatin (LIPITOR) 10 MG tablet TAKE ONE TABLET BY MOUTH EVERY DAY AT 6PM   cetirizine (ZYRTEC) 10 MG tablet Take 10 mg by mouth daily.   desonide (DESOWEN) 0.05 % cream APPLY A SMALL AMOUNT TO SKIN TWICE DAILYAS NEEDED   estradiol (VIVELLE-DOT) 0.0375 MG/24HR Place 1 patch onto the skin 2 (two) times a week.   metoprolol tartrate (LOPRESSOR) 25 MG tablet TAKE 1/2 TABLET BY MOUTH TWICE DAILY   Multiple Vitamin (MULTIVITAMIN) tablet Take 1 tablet by mouth daily.   naproxen sodium (ANAPROX) 550 MG tablet TAKE (1) TABLET BY MOUTH TWICE DAILY WITH MEALS   omeprazole (PRILOSEC) 20 MG capsule Take 1 capsule (20 mg total) by mouth daily.   pregabalin (LYRICA) 75 MG capsule Take 75 mg by mouth 2 (two) times daily.   rizatriptan (MAXALT) 5 MG tablet Take 1 tablet (5 mg total) by mouth as needed for migraine.   valACYclovir  (VALTREX) 1000 MG tablet Take 1 tablet (1,000 mg total) by mouth as needed.   valsartan-hydrochlorothiazide (DIOVAN-HCT) 80-12.5 MG tablet TAKE (1) TABLET BY MOUTH EVERY DAY   venlafaxine XR (EFFEXOR-XR) 75 MG 24 hr capsule TAKE (1) CAPSULE BY MOUTH EVERY DAY WITH BREAKFAST   No facility-administered encounter medications on file as of 12/29/2022.    Surgical History: Past Surgical History:  Procedure Laterality Date   ABDOMINAL HYSTERECTOMY  2005   Radical hysterectomy by Dr Kyla Balzarine at [facility] cervical cancer 1B1   BREAST CYST ASPIRATION Left 05/30/2013   CERVICAL DISC ARTHROPLASTY Right 06/11/2016   Procedure: CERVICAL ANTERIOR DISC ARTHROPLASTY CERVICAL 5-7;  Surgeon: Karenann Cai, MD;  Location: [facility] ORS;  Service: Neurosurgery;  Laterality: Right;   DIAGNOSTIC LAPAROSCOPY  2005   DILATION AND CURETTAGE OF UTERUS     SAB   LEEP  2005   Dr. Luella Cook - invasive endocervical adenocarcinoma.    NASAL SINUS SURGERY      Medical History: Past Medical History:  Diagnosis Date   Anxiety    Cancer (HCC)    cervical   Cervical cancer, FIGO stage IB1 (HCC) 2005   Cervical   GERD (gastroesophageal reflux disease)    Headache    Hyperlipidemia    Hypertension    Migraine    Shingles 2019    Family History: Family History  Problem Relation Age of Onset   Lung cancer Mother  Hyperlipidemia Mother    Diabetes Father    Hypertension Father    Heart failure Father    Heart attack Father    Heart disease Father    Hyperlipidemia Brother    Breast cancer Neg Hx       Review of Systems  Constitutional:  Negative for activity change, appetite change, chills, fatigue, fever and unexpected weight change.  HENT: Negative.  Negative for congestion, ear pain, rhinorrhea, sore throat and trouble swallowing.   Eyes: Negative.   Respiratory: Negative.  Negative for cough, chest tightness, shortness of breath and wheezing.   Cardiovascular: Negative.  Negative for chest  pain.  Gastrointestinal: Negative.  Negative for abdominal pain, blood in stool, constipation, diarrhea, nausea and vomiting.  Endocrine: Negative.   Genitourinary: Negative.  Negative for difficulty urinating, dysuria, frequency, hematuria and urgency.  Musculoskeletal: Negative.  Negative for arthralgias, back pain, joint swelling, myalgias and neck pain.  Skin: Negative.  Negative for rash and wound.  Allergic/Immunologic: Negative.  Negative for immunocompromised state.  Neurological: Negative.  Negative for dizziness, seizures, numbness and headaches.  Hematological: Negative.   Psychiatric/Behavioral: Negative.  Negative for behavioral problems, self-injury and suicidal ideas. The patient is not nervous/anxious.      Vital Signs: BP 110/85   Pulse 66   Temp 98.4 F (36.9 C)   Resp 16   Ht 5\' 11"  (1.803 m)   Wt 193 lb 12.8 oz (87.9 kg)   SpO2 98%   BMI 27.03 kg/m    Physical Exam Vitals and nursing note reviewed.  Constitutional:      General: She is not in acute distress.    Appearance: Normal appearance. She is well-developed and normal weight. She is not diaphoretic.  HENT:     Head: Normocephalic and atraumatic.     Mouth/Throat:     Pharynx: No oropharyngeal exudate.  Eyes:     Pupils: Pupils are equal, round, and reactive to light.  Neck:     Thyroid: No thyromegaly.     Vascular: No JVD.     Trachea: No tracheal deviation.  Cardiovascular:     Rate and Rhythm: Normal rate and regular rhythm.     Heart sounds: Normal heart sounds. No murmur heard.    No friction rub. No gallop.  Pulmonary:     Effort: Pulmonary effort is normal. No respiratory distress.     Breath sounds: No wheezing or rales.  Chest:     Chest wall: No tenderness.  Abdominal:     General: Bowel sounds are normal.     Palpations: Abdomen is soft.     Tenderness: There is no abdominal tenderness.  Musculoskeletal:        General: Normal range of motion.     Cervical back: Normal range  of motion and neck supple.  Lymphadenopathy:     Cervical: No cervical adenopathy.  Skin:    General: Skin is warm and dry.  Neurological:     Mental Status: She is alert and oriented to person, place, and time.     Cranial Nerves: No cranial nerve deficit.  Psychiatric:        Behavior: Behavior normal.        Thought Content: Thought content normal.        Judgment: Judgment normal.      LABS: Recent Results (from the past 2160 hour(s))  UA/M w/rflx Culture, Routine     Status: Abnormal   Collection Time: 12/29/22  3:50 PM  Specimen: Urine   Urine  Result Value Ref Range   Specific Gravity, UA      <=1.005 (A) 1.005 - 1.030   pH, UA 6.0 5.0 - 7.5   Color, UA Yellow Yellow   Appearance Ur Clear Clear   Leukocytes,UA Negative Negative   Protein,UA Negative Negative/Trace   Glucose, UA Negative Negative   Ketones, UA Negative Negative   RBC, UA Negative Negative   Bilirubin, UA Negative Negative   Urobilinogen, Ur 0.2 0.2 - 1.0 mg/dL   Nitrite, UA Negative Negative   Microscopic Examination Comment     Comment: Microscopic follows if indicated.   Microscopic Examination See below:     Comment: Microscopic was indicated and was performed.   Urinalysis Reflex Comment     Comment: This specimen will not reflex to a Urine Culture.  Microscopic Examination     Status: None   Collection Time: 12/29/22  3:50 PM   Urine  Result Value Ref Range   WBC, UA None seen 0 - 5 /hpf   RBC, Urine None seen 0 - 2 /hpf   Epithelial Cells (non renal) 0-10 0 - 10 /hpf   Casts None seen None seen /lpf   Bacteria, UA None seen None seen/Few        Assessment/Plan: 1. Encounter for general adult medical examination with abnormal findings CPE performed, due for labs next visit, will think about shingles vaccine otherwise UTD on PHM  2. Essential hypertension Stable, continue current medications  3. Generalized anxiety disorder Stable, continue effexor  4. Mixed  hyperlipidemia Continue lipitor  5. Dysuria - UA/M w/rflx Culture, Routine   General Counseling: Laura Barber verbalizes understanding of the findings of todays visit and agrees with plan of treatment. I have discussed any further diagnostic evaluation that may be needed or ordered today. We also reviewed her medications today. she has been encouraged to call the office with any questions or concerns that should arise related to todays visit.    Counseling:    Orders Placed This Encounter  Procedures   Microscopic Examination   UA/M w/rflx Culture, Routine    No orders of the defined types were placed in this encounter.   This patient was seen by Lynn Ito, PA-C in collaboration with Dr. Beverely Risen as a part of collaborative care agreement.  Total time spent:35 Minutes  Time spent includes review of chart, medications, test results, and follow up plan with the patient.     Lyndon Code, MD  Internal Medicine

## 2022-12-30 LAB — UA/M W/RFLX CULTURE, ROUTINE
Bilirubin, UA: NEGATIVE
Glucose, UA: NEGATIVE
Ketones, UA: NEGATIVE
Leukocytes,UA: NEGATIVE
Nitrite, UA: NEGATIVE
Protein,UA: NEGATIVE
RBC, UA: NEGATIVE
Specific Gravity, UA: <=1.005 — AB (ref 1.005–1.030)
Urobilinogen, Ur: 0.2 mg/dL (ref 0.2–1.0)
pH, UA: 6 (ref 5.0–7.5)

## 2022-12-30 LAB — MICROSCOPIC EXAMINATION
Bacteria, UA: NONE SEEN
Casts: NONE SEEN /lpf
RBC, Urine: NONE SEEN /hpf (ref 0–2)
WBC, UA: NONE SEEN /hpf (ref 0–5)

## 2023-01-20 ENCOUNTER — Other Ambulatory Visit: Payer: Self-pay

## 2023-01-20 MED ORDER — METOPROLOL TARTRATE 25 MG PO TABS: 12.5000 mg | ORAL_TABLET | Freq: Two times a day (BID) | ORAL | 3 refills | Status: DC

## 2023-02-19 ENCOUNTER — Other Ambulatory Visit: Payer: Self-pay | Admitting: Physician Assistant

## 2023-02-19 ENCOUNTER — Encounter: Payer: Self-pay | Admitting: Physician Assistant

## 2023-02-19 DIAGNOSIS — B009 Herpesviral infection, unspecified: Secondary | ICD-10-CM

## 2023-02-27 ENCOUNTER — Other Ambulatory Visit: Payer: Self-pay | Admitting: Physician Assistant

## 2023-02-27 ENCOUNTER — Encounter: Payer: Self-pay | Admitting: Physician Assistant

## 2023-02-27 DIAGNOSIS — E785 Hyperlipidemia, unspecified: Secondary | ICD-10-CM

## 2023-04-16 ENCOUNTER — Other Ambulatory Visit: Payer: Self-pay | Admitting: Obstetrics and Gynecology

## 2023-04-16 DIAGNOSIS — N951 Menopausal and female climacteric states: Secondary | ICD-10-CM

## 2023-04-16 DIAGNOSIS — Z79890 Hormone replacement therapy (postmenopausal): Secondary | ICD-10-CM

## 2023-04-24 ENCOUNTER — Other Ambulatory Visit: Payer: Self-pay | Admitting: Nurse Practitioner

## 2023-04-24 ENCOUNTER — Encounter: Payer: Self-pay | Admitting: Obstetrics and Gynecology

## 2023-04-24 ENCOUNTER — Other Ambulatory Visit: Payer: Self-pay | Admitting: Obstetrics and Gynecology

## 2023-04-24 DIAGNOSIS — N951 Menopausal and female climacteric states: Secondary | ICD-10-CM

## 2023-04-24 DIAGNOSIS — Z79890 Hormone replacement therapy (postmenopausal): Secondary | ICD-10-CM

## 2023-04-24 DIAGNOSIS — I10 Essential (primary) hypertension: Secondary | ICD-10-CM

## 2023-04-24 MED ORDER — ESTRADIOL 0.0375 MG/24HR TD PTTW: 1.0000 | MEDICATED_PATCH | TRANSDERMAL | 0 refills | Status: DC

## 2023-04-24 NOTE — Progress Notes (Signed)
Rx RF ERT. Has annual 9/24

## 2023-05-01 ENCOUNTER — Ambulatory Visit: Payer: No Typology Code available for payment source | Admitting: Physician Assistant

## 2023-05-28 ENCOUNTER — Other Ambulatory Visit: Payer: Self-pay | Admitting: Obstetrics and Gynecology

## 2023-05-28 DIAGNOSIS — Z79890 Hormone replacement therapy (postmenopausal): Secondary | ICD-10-CM

## 2023-05-28 DIAGNOSIS — N951 Menopausal and female climacteric states: Secondary | ICD-10-CM

## 2023-06-09 ENCOUNTER — Encounter: Payer: Self-pay | Admitting: Obstetrics and Gynecology

## 2023-06-09 ENCOUNTER — Ambulatory Visit (INDEPENDENT_AMBULATORY_CARE_PROVIDER_SITE_OTHER): Payer: No Typology Code available for payment source | Admitting: Obstetrics and Gynecology

## 2023-06-09 VITALS — BP 122/70 | Ht 71.0 in | Wt 187.0 lb

## 2023-06-09 DIAGNOSIS — N951 Menopausal and female climacteric states: Secondary | ICD-10-CM

## 2023-06-09 DIAGNOSIS — Z01419 Encounter for gynecological examination (general) (routine) without abnormal findings: Secondary | ICD-10-CM

## 2023-06-09 DIAGNOSIS — Z1231 Encounter for screening mammogram for malignant neoplasm of breast: Secondary | ICD-10-CM

## 2023-06-09 DIAGNOSIS — Z79890 Hormone replacement therapy (postmenopausal): Secondary | ICD-10-CM

## 2023-06-09 MED ORDER — ESTRADIOL 0.0375 MG/24HR TD PTTW: 1.0000 | MEDICATED_PATCH | TRANSDERMAL | 3 refills | Status: DC

## 2023-06-09 NOTE — Progress Notes (Signed)
PCP: Carlean Jews, PA-C   Chief Complaint  Patient presents with   Gynecologic Exam    No concerns    HPI:      [name] is a 57 y.o. G1P0010 whose LMP was No LMP recorded. Patient has had a hysterectomy., presents today for her annual examination.  Her menses are absent due to TAH 2015 for cervical cancer by Dr. Kyla Balzarine at [facility]. No PMB. She does not have vasomotor sx except occas night sweats. On vivelle dot 0.0375 with sx control.   Sex activity: single partner, contraception - status post hysterectomy. She does not have vaginal dryness/pain/bleeding.  Last Pap: 05/19/22 Results were: no abnormalities /neg HPV DNA. Hx of cervical cancer FIGO IB1, 2015. Repeat paps due Q3 yrs for 25 yrs per [facility]. Hx of STDs: HPV  Last mammogram: 07/01/22 Results were: normal--routine follow-up in 12 months There is no FH of breast cancer. There is no FH of ovarian cancer. The patient does do self-breast exams.  Colonoscopy: 2019 with Dr. Norma Fredrickson, Repeat due after 10 years pet pt.   Tobacco use: The patient denies current or previous tobacco use. Alcohol use: social drinker No drug use Exercise: min active  She does get adequate calcium but not Vitamin D in her diet.  Labs with PCP.   Patient Active Problem List   Diagnosis Date Noted   Menopausal symptoms 05/18/2022   Anxiety 01/25/2019   Palpitations 01/25/2019   Hypertension    Hyperlipidemia    GERD (gastroesophageal reflux disease)    Cervical cancer, FIGO stage IB1 (HCC) 01/14/2017   Cervical stenosis of spine 06/11/2016   History of cervical cancer 06/27/2014    Past Surgical History:  Procedure Laterality Date   ABDOMINAL HYSTERECTOMY  2005   Radical hysterectomy by Dr Kyla Balzarine at [facility] cervical cancer 1B1   BREAST CYST ASPIRATION Left 05/30/2013   CERVICAL DISC ARTHROPLASTY Right 06/11/2016   Procedure: CERVICAL ANTERIOR DISC ARTHROPLASTY CERVICAL 5-7;  Surgeon: Karenann Cai, MD;  Location:  [facility] ORS;  Service: Neurosurgery;  Laterality: Right;   DIAGNOSTIC LAPAROSCOPY  2005   DILATION AND CURETTAGE OF UTERUS     SAB   LEEP  2005   Dr. Luella Cook - invasive endocervical adenocarcinoma.    NASAL SINUS SURGERY      Family History  Problem Relation Age of Onset   Lung cancer Mother    Hyperlipidemia Mother    Diabetes Father    Hypertension Father    Heart failure Father    Heart attack Father    Heart disease Father    Hyperlipidemia Brother    Breast cancer Neg Hx     Social History   Socioeconomic History   Marital status: Divorced    Spouse name: Not on file   Number of children: 0   Years of education: Not on file   Highest education level: Not on file  Occupational History   Not on file  Tobacco Use   Smoking status: Never   Smokeless tobacco: Never  Vaping Use   Vaping status: Never Used  Substance and Sexual Activity   Alcohol use: Yes    Comment: occassional   Drug use: No   Sexual activity: Yes    Partners: Male    Birth control/protection: Surgical    Comment: Hysterectomy  Other Topics Concern   Not on file  Social History Narrative   Not on file   Social Determinants of Health   Financial Resource Strain:  Low Risk  (12/10/2022)   Received from [facility], [facility]   Overall Financial Resource Strain (CARDIA)    Difficulty of Paying Living Expenses: Not hard at all  Food Insecurity: No Food Insecurity (12/10/2022)   Received from [facility], [facility]   Hunger Vital Sign    Worried About Running Out of Food in the Last Year: Never true    Ran Out of Food in the Last Year: Never true  Transportation Needs: No Transportation Needs (12/10/2022)   Received from [facility], [facility]   [facility] - Transportation    In the past 12 months, has lack of transportation kept you from medical appointments or from getting medications?:  No    Lack of Transportation (Non-Medical): No  Physical Activity: Not on file  Stress: Not on file  Social Connections: Not on file  Intimate Partner Violence: Not on file     Current Outpatient Medications:    ALPRAZolam (XANAX) 0.5 MG tablet, Take 1 tablet (0.5 mg total) by mouth at bedtime as needed for anxiety., Disp: 30 tablet, Rfl: 0   atorvastatin (LIPITOR) 10 MG tablet, TAKE ONE TABLET BY MOUTH EVERY DAY AT 6PM, Disp: 90 tablet, Rfl: 1   Azelaic Acid 15 % gel, Apply topically daily., Disp: , Rfl:    cetirizine (ZYRTEC) 10 MG tablet, Take 10 mg by mouth daily., Disp: , Rfl:    clobetasol (TEMOVATE) 0.05 % external solution, Apply topically 2 (two) times daily., Disp: , Rfl:    Clobetasol Propionate 0.05 % shampoo, Apply 1 Application topically 3 (three) times a week., Disp: , Rfl:    desonide (DESOWEN) 0.05 % cream, APPLY A SMALL AMOUNT TO SKIN TWICE DAILYAS NEEDED, Disp: 30 g, Rfl: 3   methocarbamol (ROBAXIN) 750 MG tablet, Take 1 tablet as needed by oral route at bedtime., Disp: , Rfl:    metoprolol tartrate (LOPRESSOR) 25 MG tablet, Take 0.5 tablets (12.5 mg total) by mouth 2 (two) times daily., Disp: 90 tablet, Rfl: 3   Multiple Vitamin (MULTIVITAMIN) tablet, Take 1 tablet by mouth daily., Disp: , Rfl:    naproxen sodium (ANAPROX) 550 MG tablet, TAKE (1) TABLET BY MOUTH TWICE DAILY WITH MEALS, Disp: 60 tablet, Rfl: 2   omeprazole (PRILOSEC) 20 MG capsule, Take 1 capsule (20 mg total) by mouth daily., Disp: 30 capsule, Rfl: 3   pregabalin (LYRICA) 75 MG capsule, Take 75 mg by mouth 2 (two) times daily., Disp: , Rfl:    RHOFADE 1 % CREA, Apply topically., Disp: , Rfl:    rizatriptan (MAXALT) 5 MG tablet, Take 1 tablet (5 mg total) by mouth as needed for migraine., Disp: 10 tablet, Rfl: 2   valACYclovir (VALTREX) 1000 MG tablet, TAKE (1) TABLET BY MOUTH AS NEEDED, Disp: 30 tablet, Rfl: 2   valsartan-hydrochlorothiazide (DIOVAN-HCT) 80-12.5 MG tablet, TAKE (1) TABLET BY MOUTH EVERY  DAY, Disp: 90 tablet, Rfl: 1   venlafaxine XR (EFFEXOR-XR) 75 MG 24 hr capsule, TAKE (1) CAPSULE BY MOUTH EVERY DAY WITH BREAKFAST, Disp: 90 capsule, Rfl: 1   [START ON 06/11/2023] estradiol (VIVELLE-DOT) 0.0375 MG/24HR, Place 1 patch onto the skin 2 (two) times a week., Disp: 24 patch, Rfl: 3     ROS:  Review of Systems  Constitutional:  Negative for fatigue, fever and unexpected weight change.  Respiratory:  Negative for cough, shortness of breath and wheezing.   Cardiovascular:  Negative for chest pain, palpitations  and leg swelling.  Gastrointestinal:  Negative for blood in stool, constipation, diarrhea, nausea and vomiting.  Endocrine: Negative for cold intolerance, heat intolerance and polyuria.  Genitourinary:  Negative for dyspareunia, dysuria, flank pain, frequency, genital sores, hematuria, menstrual problem, pelvic pain, urgency, vaginal bleeding, vaginal discharge and vaginal pain.  Musculoskeletal:  Negative for back pain, joint swelling and myalgias.  Skin:  Negative for rash.  Neurological:  Negative for dizziness, syncope, light-headedness, numbness and headaches.  Hematological:  Negative for adenopathy.  Psychiatric/Behavioral:  Negative for agitation, confusion, sleep disturbance and suicidal ideas. The patient is not nervous/anxious.    BREAST: No symptoms    Objective: BP 122/70   Ht 5\' 11"  (1.803 m)   Wt 187 lb (84.8 kg)   BMI 26.08 kg/m    Physical Exam Constitutional:      Appearance: She is well-developed.  Genitourinary:     Vulva normal.     Genitourinary Comments: UTERUS/CX SURG REM     Right Labia: No rash, tenderness or lesions.    Left Labia: No tenderness, lesions or rash.    Vaginal cuff intact.    No vaginal discharge, erythema or tenderness.      Right Adnexa: not tender and no mass present.    Left Adnexa: not tender and no mass present.    Cervix is absent.     Uterus is absent.  Breasts:    Right: No mass, nipple discharge, skin  change or tenderness.     Left: No mass, nipple discharge, skin change or tenderness.  Neck:     Thyroid: No thyromegaly.  Cardiovascular:     Rate and Rhythm: Normal rate and regular rhythm.     Heart sounds: Normal heart sounds. No murmur heard. Pulmonary:     Effort: Pulmonary effort is normal.     Breath sounds: Normal breath sounds.  Abdominal:     Palpations: Abdomen is soft.     Tenderness: There is no abdominal tenderness. There is no guarding.  Musculoskeletal:        General: Normal range of motion.     Cervical back: Normal range of motion.  Neurological:     General: No focal deficit present.     Mental Status: She is alert and oriented to person, place, and time.     Cranial Nerves: No cranial nerve deficit.  Skin:    General: Skin is warm and dry.  Psychiatric:        Mood and Affect: Mood normal.        Behavior: Behavior normal.        Thought Content: Thought content normal.        Judgment: Judgment normal.  Vitals reviewed.     Assessment/Plan:  Encounter for annual routine gynecological examination  Cervical cancer, FIGO stage IB1 (HCC) - Plan:  repeat pap Q3 yrs for 25 yrs.   Encounter for screening mammogram for malignant neoplasm of breast - Plan: MM 3D SCREEN BREAST BILATERAL; pt to schedule mammo  Menopausal symptoms - Plan: estradiol (VIVELLE-DOT) 0.0375 MG/24HR; Rx RF. Doing well.   Hormone replacement therapy (HRT) - Plan: estradiol (VIVELLE-DOT) 0.0375 MG/24HR   Meds ordered this encounter  Medications   estradiol (VIVELLE-DOT) 0.0375 MG/24HR    Sig: Place 1 patch onto the skin 2 (two) times a week.    Dispense:  24 patch    Refill:  3    Order Specific Question:   Supervising Provider    Answer:   Valentino Saxon,  ANIKA [[id number]]           GYN counsel breast self exam, mammography screening, menopause, adequate intake of calcium and vitamin D, diet and exercise    F/U  Return in about 1 year (around 06/08/2024).  Bedford Winsor B. Brogen Duell,  PA-C 06/09/2023 4:27 PM

## 2023-06-09 NOTE — Patient Instructions (Addendum)
I value your feedback and you entrusting us with your care. If you get a [facility] patient survey, I would appreciate you taking the time to let us know about your experience today. Thank you!  [facility] ([place])--[phone]  

## 2023-06-11 ENCOUNTER — Ambulatory Visit (INDEPENDENT_AMBULATORY_CARE_PROVIDER_SITE_OTHER): Payer: No Typology Code available for payment source | Admitting: Physician Assistant

## 2023-06-11 ENCOUNTER — Encounter: Payer: Self-pay | Admitting: Physician Assistant

## 2023-06-11 VITALS — BP 125/90 | HR 72 | Temp 98.0°F | Resp 16 | Ht 71.0 in | Wt 185.6 lb

## 2023-06-11 DIAGNOSIS — E782 Mixed hyperlipidemia: Secondary | ICD-10-CM | POA: Diagnosis not present

## 2023-06-11 DIAGNOSIS — F411 Generalized anxiety disorder: Secondary | ICD-10-CM | POA: Diagnosis not present

## 2023-06-11 DIAGNOSIS — R5383 Other fatigue: Secondary | ICD-10-CM

## 2023-06-11 DIAGNOSIS — E559 Vitamin D deficiency, unspecified: Secondary | ICD-10-CM

## 2023-06-11 DIAGNOSIS — I10 Essential (primary) hypertension: Secondary | ICD-10-CM

## 2023-06-11 DIAGNOSIS — E538 Deficiency of other specified B group vitamins: Secondary | ICD-10-CM

## 2023-06-11 DIAGNOSIS — R946 Abnormal results of thyroid function studies: Secondary | ICD-10-CM

## 2023-06-11 NOTE — Progress Notes (Signed)
[facility] [address] [place], [place] [address]  Internal MEDICINE  Office Visit Note  Patient Name: Laura Barber  [id number]  [id number]  Date of Service: 06/18/2023  Chief Complaint  Patient presents with   Follow-up   Gastroesophageal Reflux   Hypertension   Hyperlipidemia    HPI Pt is here fro routine follow up -Was dx with atopic dermatitis of scalp. Seeing dermatologist. Having some skin irritation other places though. Will have follow up with dermatology in a few weeks -BP not checked at home -Will be having GI office visit to schedule next colonoscopy -mammogram will be scheduled soon -doing well with current medications -will order fasting labs  Current Medication: Outpatient Encounter Medications as of 06/11/2023  Medication Sig   ALPRAZolam (XANAX) 0.5 MG tablet Take 1 tablet (0.5 mg total) by mouth at bedtime as needed for anxiety.   atorvastatin (LIPITOR) 10 MG tablet TAKE ONE TABLET BY MOUTH EVERY DAY AT 6PM   Azelaic Acid 15 % gel Apply topically daily.   cetirizine (ZYRTEC) 10 MG tablet Take 10 mg by mouth daily.   clobetasol (TEMOVATE) 0.05 % external solution Apply topically 2 (two) times daily.   Clobetasol Propionate 0.05 % shampoo Apply 1 Application topically 3 (three) times a week.   desonide (DESOWEN) 0.05 % cream APPLY A SMALL AMOUNT TO SKIN TWICE DAILYAS NEEDED   estradiol (VIVELLE-DOT) 0.0375 MG/24HR Place 1 patch onto the skin 2 (two) times a week.   methocarbamol (ROBAXIN) 750 MG tablet Take 1 tablet as needed by oral route at bedtime.   metoprolol tartrate (LOPRESSOR) 25 MG tablet Take 0.5 tablets (12.5 mg total) by mouth 2 (two) times daily.   Multiple Vitamin (MULTIVITAMIN) tablet Take 1 tablet by mouth daily.   naproxen sodium (ANAPROX) 550 MG tablet TAKE (1) TABLET BY MOUTH TWICE DAILY WITH MEALS   omeprazole (PRILOSEC) 20 MG capsule Take 1 capsule (20 mg total) by mouth daily.   pregabalin (LYRICA) 75 MG capsule Take  75 mg by mouth 2 (two) times daily.   RHOFADE 1 % CREA Apply topically.   rizatriptan (MAXALT) 5 MG tablet Take 1 tablet (5 mg total) by mouth as needed for migraine.   valACYclovir (VALTREX) 1000 MG tablet TAKE (1) TABLET BY MOUTH AS NEEDED   valsartan-hydrochlorothiazide (DIOVAN-HCT) 80-12.5 MG tablet TAKE (1) TABLET BY MOUTH EVERY DAY   venlafaxine XR (EFFEXOR-XR) 75 MG 24 hr capsule TAKE (1) CAPSULE BY MOUTH EVERY DAY WITH BREAKFAST   No facility-administered encounter medications on file as of 06/11/2023.    Surgical History: Past Surgical History:  Procedure Laterality Date   ABDOMINAL HYSTERECTOMY  2005   Radical hysterectomy by Dr Kyla Balzarine at [facility] cervical cancer 1B1   BREAST CYST ASPIRATION Left 05/30/2013   CERVICAL DISC ARTHROPLASTY Right 06/11/2016   Procedure: CERVICAL ANTERIOR DISC ARTHROPLASTY CERVICAL 5-7;  Surgeon: Karenann Cai, MD;  Location: [facility] ORS;  Service: Neurosurgery;  Laterality: Right;   DIAGNOSTIC LAPAROSCOPY  2005   DILATION AND CURETTAGE OF UTERUS     SAB   LEEP  2005   Dr. Luella Cook - invasive endocervical adenocarcinoma.    NASAL SINUS SURGERY      Medical History: Past Medical History:  Diagnosis Date   Anxiety    Cancer (HCC)    cervical   Cervical cancer, FIGO stage IB1 (HCC) 2005   Cervical   GERD (gastroesophageal reflux disease)    Headache    Hyperlipidemia    Hypertension  Migraine    Shingles 2019    Family History: Family History  Problem Relation Age of Onset   Lung cancer Mother    Hyperlipidemia Mother    Diabetes Father    Hypertension Father    Heart failure Father    Heart attack Father    Heart disease Father    Hyperlipidemia Brother    Breast cancer Neg Hx     Social History   Socioeconomic History   Marital status: Divorced    Spouse name: Not on file   Number of children: 0   Years of education: Not on file   Highest education level: Not on file  Occupational History   Not on file   Tobacco Use   Smoking status: Never   Smokeless tobacco: Never  Vaping Use   Vaping status: Never Used  Substance and Sexual Activity   Alcohol use: Yes    Comment: occassional   Drug use: No   Sexual activity: Yes    Partners: Male    Birth control/protection: Surgical    Comment: Hysterectomy  Other Topics Concern   Not on file  Social History Narrative   Not on file   Social Determinants of Health   Financial Resource Strain: Low Risk  (12/10/2022)   Received from [facility], [facility]   Overall Financial Resource Strain (CARDIA)    Difficulty of Paying Living Expenses: Not hard at all  Food Insecurity: No Food Insecurity (12/10/2022)   Received from [facility], [facility]   Hunger Vital Sign    Worried About Running Out of Food in the Last Year: Never true    Ran Out of Food in the Last Year: Never true  Transportation Needs: No Transportation Needs (12/10/2022)   Received from [facility], [facility]   [employer] - Transportation    In the past 12 months, has lack of transportation kept you from medical appointments or from getting medications?: No    Lack of Transportation (Non-Medical): No  Physical Activity: Not on file  Stress: Not on file  Social Connections: Not on file  Intimate Partner Violence: Not on file      Review of Systems  Constitutional:  Negative for chills, fatigue and unexpected weight change.  HENT:  Negative for congestion, rhinorrhea, sneezing and sore throat.   Eyes:  Negative for redness.  Respiratory:  Negative for cough, chest tightness and shortness of breath.   Cardiovascular:  Negative for chest pain and palpitations.  Gastrointestinal:  Negative for abdominal pain, constipation, diarrhea, nausea and vomiting.  Genitourinary:  Negative for dysuria and frequency.  Musculoskeletal:  Negative for arthralgias, back pain, joint  swelling and neck pain.  Skin:  Negative for rash.  Neurological: Negative.  Negative for tremors and numbness.  Hematological:  Negative for adenopathy. Does not bruise/bleed easily.  Psychiatric/Behavioral:  Negative for behavioral problems (Depression), sleep disturbance and suicidal ideas. The patient is not nervous/anxious.     Vital Signs: BP 118/88 Comment: 125/90  Pulse 72   Temp 98 F (36.7 C)   Resp 16   Ht 5\' 11"  (1.803 m)   Wt 185 lb 9.6 oz (84.2 kg)   SpO2 98%   BMI 25.89 kg/m    Physical Exam Vitals and nursing note reviewed.  Constitutional:      General: She is not in acute distress.    Appearance: Normal appearance. She is well-developed and normal  weight. She is not diaphoretic.  HENT:     Head: Normocephalic and atraumatic.     Mouth/Throat:     Pharynx: No oropharyngeal exudate.  Eyes:     Pupils: Pupils are equal, round, and reactive to light.  Neck:     Thyroid: No thyromegaly.     Vascular: No JVD.     Trachea: No tracheal deviation.  Cardiovascular:     Rate and Rhythm: Normal rate and regular rhythm.     Heart sounds: Normal heart sounds. No murmur heard.    No friction rub. No gallop.  Pulmonary:     Effort: Pulmonary effort is normal. No respiratory distress.     Breath sounds: No wheezing or rales.  Chest:     Chest wall: No tenderness.  Abdominal:     General: Bowel sounds are normal.     Palpations: Abdomen is soft.  Musculoskeletal:        General: Normal range of motion.     Cervical back: Normal range of motion and neck supple.  Lymphadenopathy:     Cervical: No cervical adenopathy.  Skin:    General: Skin is warm and dry.  Neurological:     Mental Status: She is alert and oriented to person, place, and time.     Cranial Nerves: No cranial nerve deficit.  Psychiatric:        Behavior: Behavior normal.        Thought Content: Thought content normal.        Judgment: Judgment normal.        Assessment/Plan: 1.  Essential hypertension Continue current medications  2. Generalized anxiety disorder Continue medication as before  3. Mixed hyperlipidemia - Lipid Panel With LDL/HDL Ratio  4. Vitamin D deficiency - VITAMIN D 25 Hydroxy (Vit-D Deficiency, Fractures)  5. B12 deficiency - B12 and Folate Panel  6. Abnormal thyroid exam - TSH + free T4  7. Other fatigue - CBC w/Diff/Platelet - Comprehensive metabolic panel - TSH + free T4 - Lipid Panel With LDL/HDL Ratio - VITAMIN D 25 Hydroxy (Vit-D Deficiency, Fractures) - B12 and Folate Panel - Fe+TIBC+Fer   General Counseling: Keisi verbalizes understanding of the findings of todays visit and agrees with plan of treatment. I have discussed any further diagnostic evaluation that may be needed or ordered today. We also reviewed her medications today. she has been encouraged to call the office with any questions or concerns that should arise related to todays visit.    Orders Placed This Encounter  Procedures   CBC w/Diff/Platelet   Comprehensive metabolic panel   TSH + free T4   Lipid Panel With LDL/HDL Ratio   VITAMIN D 25 Hydroxy (Vit-D Deficiency, Fractures)   B12 and Folate Panel   Fe+TIBC+Fer    No orders of the defined types were placed in this encounter.   This patient was seen by Lynn Ito, PA-C in collaboration with Dr. Beverely Risen as a part of collaborative care agreement.   Total time spent:30 Minutes Time spent includes review of chart, medications, test results, and follow up plan with the patient.      Dr Lyndon Code Internal medicine

## 2023-06-22 ENCOUNTER — Other Ambulatory Visit: Payer: Self-pay | Admitting: Physician Assistant

## 2023-06-22 ENCOUNTER — Encounter: Payer: Self-pay | Admitting: Physician Assistant

## 2023-06-22 DIAGNOSIS — G43809 Other migraine, not intractable, without status migrainosus: Secondary | ICD-10-CM

## 2023-06-22 DIAGNOSIS — F411 Generalized anxiety disorder: Secondary | ICD-10-CM

## 2023-09-09 ENCOUNTER — Ambulatory Visit
Admission: RE | Admit: 2023-09-09 | Discharge: 2023-09-09 | Disposition: A | Discharge status: Home / Self Care | Payer: No Typology Code available for payment source | Source: Ambulatory Visit | Attending: Obstetrics and Gynecology | Admitting: Obstetrics and Gynecology

## 2023-09-09 DIAGNOSIS — Z1231 Encounter for screening mammogram for malignant neoplasm of breast: Secondary | ICD-10-CM | POA: Insufficient documentation

## 2023-09-09 LAB — IRON,TIBC AND FERRITIN PANEL
Ferritin: 117 ng/mL (ref 15–150)
Iron Saturation: 24 % (ref 15–55)
Iron: 91 ug/dL (ref 27–159)
Total Iron Binding Capacity: 372 ug/dL (ref 250–450)
UIBC: 281 ug/dL (ref 131–425)

## 2023-09-09 LAB — COMPREHENSIVE METABOLIC PANEL
ALT: 26 [IU]/L (ref 0–32)
AST: 25 [IU]/L (ref 0–40)
Albumin: 4.7 g/dL (ref 3.8–4.9)
Alkaline Phosphatase: 50 [IU]/L (ref 44–121)
BUN/Creatinine Ratio: 28 — ABNORMAL HIGH (ref 9–23)
BUN: 22 mg/dL (ref 6–24)
Bilirubin Total: 0.4 mg/dL (ref 0.0–1.2)
CO2: 22 mmol/L (ref 20–29)
Calcium: 9.1 mg/dL (ref 8.7–10.2)
Chloride: 101 mmol/L (ref 96–106)
Creatinine, Ser: 0.8 mg/dL (ref 0.57–1.00)
Globulin, Total: 2.5 g/dL (ref 1.5–4.5)
Glucose: 85 mg/dL (ref 70–99)
Potassium: 4.4 mmol/L (ref 3.5–5.2)
Sodium: 139 mmol/L (ref 134–144)
Total Protein: 7.2 g/dL (ref 6.0–8.5)
eGFR: 86 mL/min/{1.73_m2} (ref >59)

## 2023-09-09 LAB — CBC WITH DIFFERENTIAL/PLATELET
Basophils Absolute: 0.1 10*3/uL (ref 0.0–0.2)
Basos: 1 %
EOS (ABSOLUTE): 0.2 10*3/uL (ref 0.0–0.4)
Eos: 3 %
Hematocrit: 42.2 % (ref 34.0–46.6)
Hemoglobin: 14.3 g/dL (ref 11.1–15.9)
Immature Grans (Abs): 0 10*3/uL (ref 0.0–0.1)
Immature Granulocytes: 0 %
Lymphocytes Absolute: 1.6 10*3/uL (ref 0.7–3.1)
Lymphs: 22 %
MCH: 31.2 pg (ref 26.6–33.0)
MCHC: 33.9 g/dL (ref 31.5–35.7)
MCV: 92 fL (ref 79–97)
Monocytes Absolute: 0.5 10*3/uL (ref 0.1–0.9)
Monocytes: 7 %
Neutrophils Absolute: 5 10*3/uL (ref 1.4–7.0)
Neutrophils: 67 %
Platelets: 233 10*3/uL (ref 150–450)
RBC: 4.58 x10E6/uL (ref 3.77–5.28)
RDW: 11.6 % — ABNORMAL LOW (ref 11.7–15.4)
WBC: 7.4 10*3/uL (ref 3.4–10.8)

## 2023-09-09 LAB — LIPID PANEL WITH LDL/HDL RATIO
Cholesterol, Total: 246 mg/dL — ABNORMAL HIGH (ref 100–199)
HDL: 73 mg/dL (ref >39)
LDL Chol Calc (NIH): 140 mg/dL — ABNORMAL HIGH (ref 0–99)
LDL/HDL Ratio: 1.9 {ratio} (ref 0.0–3.2)
Triglycerides: 186 mg/dL — ABNORMAL HIGH (ref 0–149)
VLDL Cholesterol Cal: 33 mg/dL (ref 5–40)

## 2023-09-09 LAB — B12 AND FOLATE PANEL
Folate: 17.8 ng/mL (ref >3.0)
Vitamin B-12: 576 pg/mL (ref 232–1245)

## 2023-09-09 LAB — VITAMIN D 25 HYDROXY (VIT D DEFICIENCY, FRACTURES): Vit D, 25-Hydroxy: 75.8 ng/mL (ref 30.0–100.0)

## 2023-09-09 LAB — TSH+FREE T4
Free T4: 0.84 ng/dL (ref 0.82–1.77)
TSH: 1.28 u[IU]/mL (ref 0.450–4.500)

## 2023-09-11 ENCOUNTER — Encounter: Payer: Self-pay | Admitting: Obstetrics and Gynecology

## 2023-09-15 ENCOUNTER — Other Ambulatory Visit: Payer: Self-pay | Admitting: Physician Assistant

## 2023-09-15 DIAGNOSIS — G43809 Other migraine, not intractable, without status migrainosus: Secondary | ICD-10-CM

## 2023-09-15 DIAGNOSIS — F411 Generalized anxiety disorder: Secondary | ICD-10-CM

## 2023-09-16 NOTE — Progress Notes (Unsigned)
Cardiology Office Note    Date:  09/17/2023   ID:  GENESE HIBBETT, DOB [birth year], MRN [id number]  PCP:  Carlean Jews, PA-C  Cardiologist:  Lorine Bears, MD  Electrophysiologist:  None   Chief Complaint: Follow-up  History of Present Illness:   Laura Barber is a 58 y.o. female with history of palpitations/PVCs, hypertension, hyperlipidemia, anxiety, and GERD who presents for follow up of palpitations.   She was evaluated by Dr. Kirke Corin on 04/09/2018 for palpitations and PVCs.  She reported onset of palpitations over the prior few months that were described as a fluttering sensation mostly at rest and most noticeable at night when she was trying to sleep.  There was associated mild shortness of breath.  Prior 48-hour Holter monitor showed 2400 PVCs in 48 hours.  Upon cardiology review of the monitor there was noted to be quite a bit of motion artifact which may have interfered with the count.  It was felt her PVC burden was much less than the reported 2400.  She reported a family history remarkable for CAD, though not prematurely.  There was no family history of sudden death.  Echo in 05-06-2018 showed an EF of 60 to 65%, no regional wall motion abnormalities, normal LV diastolic function, normal RV cavity size and systolic function.  No significant valvular abnormalities.  She was seen in the office on 09/10/2018 noting an increase in palpitations with associated dizziness.  She continued to be able to exercise without issues.  In this setting, she underwent 2 separate 14-day Zio monitors with each showing normal sinus rhythm with isolated PVCs with the first monitor showing an overall burden of 2.4% which was felt to be overestimated secondary to artifact.  The second Zio monitor showed sinus rhythm with rare PACs and PVCs with an overall burden of less than 1% with rare ventricular couplets/triplets/ventricular bigeminy/trigeminy.  She was seen in 09/2018 noting an improvement in her  tachypalpitations and flushing with the initiation of Lopressor.  Given ventricular ectopy noted on outpatient cardiac monitoring she underwent ETT in 10/2018 which showed no evidence of ischemia and average exercise capacity with an exercise duration of 7 minutes and 38 seconds achieving a workload of 9.5 METs.  Echo ordered by outside office in 04/2020 demonstrated, normal LV systolic function with an EF of 60 to 65%, normal wall motion, diastolic dysfunction, mild mitral regurgitation, and trace tricuspid regurgitation.  She was seen in the office in 06/2020 and doing very well from a cardiac perspective.  She noted resolution of palpitations and was without chest pain.  She did note mild dizziness with quick positional changes.  She was continued on low-dose Lopressor.   She was seen by an urgent care on 09/28/2020 with chest pressure.  Tenderness was noted when she pressed on her chest.  Her symptoms were reproducible to palpation on their exam.  EKG showed NSR, 71 bpm, no acute ST-T changes.  High-sensitivity troponin negative x1.  Chest x-ray without acute cardiopulmonary abnormality.  Symptoms were suspected to be musculoskeletal in etiology.  She followed up with cardiology on 10/12/2020, and had not had any further chest discomfort.  She did note she was under significant stress.  She underwent coronary CTA on 10/25/2020, which showed a calcium score of 0 and no evidence of CAD.  She was last seen in the office in 02/2022 and was without symptoms of angina or cardiac decompensation.  Palpitation burden remained well-controlled on metoprolol.  No changes were indicated  at that time.  She comes in doing well from a cardiac perspective and is without symptoms of angina or cardiac decompensation.  No palpitations, presyncope, or syncope.  She does note a stable short-lived dizziness if she changes positions quickly.  Blood pressure has been well-controlled.  Recent labs showed an increase in her cholesterol as  outlined below.  She reports some dietary indiscretion, though has been adherent to atorvastatin.  Otherwise, she does not have any acute cardiac concerns at this time.   Labs independently reviewed: 08/2023 - TC 246, TG 186, HDL 73, LDL 140, TSH normal, BUN 22, serum creatinine 0.8, potassium 4.4, albumin 4.7, AST/ALT normal, Hgb 14.3, PLT 233  Past Medical History:  Diagnosis Date   Anxiety    Cancer (HCC)    cervical   Cervical cancer, FIGO stage IB1 (HCC) 2005   Cervical   GERD (gastroesophageal reflux disease)    Headache    Hyperlipidemia    Hypertension    Migraine    Shingles 2019    Past Surgical History:  Procedure Laterality Date   ABDOMINAL HYSTERECTOMY  2005   Radical hysterectomy by Dr Kyla Balzarine at [facility] cervical cancer 1B1   BREAST CYST ASPIRATION Left 05/30/2013   CERVICAL DISC ARTHROPLASTY Right 06/11/2016   Procedure: CERVICAL ANTERIOR DISC ARTHROPLASTY CERVICAL 5-7;  Surgeon: Karenann Cai, MD;  Location: [facility] ORS;  Service: Neurosurgery;  Laterality: Right;   DIAGNOSTIC LAPAROSCOPY  2005   DILATION AND CURETTAGE OF UTERUS     SAB   LEEP  2005   Dr. Luella Cook - invasive endocervical adenocarcinoma.    NASAL SINUS SURGERY      Current Medications: Current Meds  Medication Sig   ALPRAZolam (XANAX) 0.5 MG tablet Take 1 tablet (0.5 mg total) by mouth at bedtime as needed for anxiety.   atorvastatin (LIPITOR) 10 MG tablet TAKE ONE TABLET BY MOUTH EVERY DAY AT 6PM   Azelaic Acid 15 % gel Apply topically daily.   cetirizine (ZYRTEC) 10 MG tablet Take 10 mg by mouth daily.   clobetasol (TEMOVATE) 0.05 % external solution Apply topically 2 (two) times daily.   Clobetasol Propionate 0.05 % shampoo Apply 1 Application topically 3 (three) times a week.   desonide (DESOWEN) 0.05 % cream APPLY A SMALL AMOUNT TO SKIN TWICE DAILYAS NEEDED   estradiol (VIVELLE-DOT) 0.0375 MG/24HR Place 1 patch onto the skin 2 (two) times a week.   methocarbamol (ROBAXIN) 750 MG  tablet Take 1 tablet as needed by oral route at bedtime.   metoprolol tartrate (LOPRESSOR) 25 MG tablet Take 0.5 tablets (12.5 mg total) by mouth 2 (two) times daily.   Multiple Vitamin (MULTIVITAMIN) tablet Take 1 tablet by mouth daily.   naproxen sodium (ANAPROX) 550 MG tablet TAKE (1) TABLET BY MOUTH TWICE DAILY WITH MEALS   omeprazole (PRILOSEC) 20 MG capsule Take 1 capsule (20 mg total) by mouth daily.   pregabalin (LYRICA) 75 MG capsule Take 75 mg by mouth 2 (two) times daily.   RHOFADE 1 % CREA Apply topically.   rizatriptan (MAXALT) 5 MG tablet Take 1 tablet (5 mg total) by mouth as needed for migraine.   valACYclovir (VALTREX) 1000 MG tablet TAKE (1) TABLET BY MOUTH AS NEEDED   valsartan-hydrochlorothiazide (DIOVAN-HCT) 80-12.5 MG tablet TAKE (1) TABLET BY MOUTH EVERY DAY   venlafaxine XR (EFFEXOR-XR) 75 MG 24 hr capsule TAKE (1) CAPSULE BY MOUTH EVERY MORNING WITH BREAKFAST    Allergies:   Claritin-d 12 hour [loratadine-pseudoephedrine er] and Sulfa antibiotics  Social History   Socioeconomic History   Marital status: Divorced    Spouse name: Not on file   Number of children: 0   Years of education: Not on file   Highest education level: Not on file  Occupational History   Not on file  Tobacco Use   Smoking status: Never   Smokeless tobacco: Never  Vaping Use   Vaping status: Never Used  Substance and Sexual Activity   Alcohol use: Yes    Comment: occassional   Drug use: No   Sexual activity: Yes    Partners: Male    Birth control/protection: Surgical    Comment: Hysterectomy  Other Topics Concern   Not on file  Social History Narrative   Not on file   Social Drivers of Health   Financial Resource Strain: Low Risk  (12/10/2022)   Received from [facility], [facility]   Overall Financial Resource Strain (CARDIA)    Difficulty of Paying Living Expenses: Not hard at all  Food Insecurity: No Food Insecurity (12/10/2022)    [facility], [facility]   Hunger Vital Sign    Worried About Running Out of Food in the Last Year: Never true    Ran Out of Food in the Last Year: Never true  Transportation Needs: No Transportation Needs (12/10/2022)   Received from [facility], [facility]   [facility] - Transportation    In the past 12 months, has lack of transportation kept you from medical appointments or from getting medications?: No    Lack of Transportation (Non-Medical): No  Physical Activity: Not on file  Stress: Not on file  Social Connections: Not on file     Family History:  The patient's family history includes Diabetes in her father; Heart attack in her father; Heart disease in her father; Heart failure in her father; Hyperlipidemia in her brother and mother; Hypertension in her father; Lung cancer in her mother. There is no history of Breast cancer.  ROS:   12-point review of systems is negative unless otherwise noted in the HPI.   EKGs/Labs/Other Studies Reviewed:    Studies reviewed were summarized above. The additional studies were reviewed today:  Coronary CTA 10/25/2020: Aorta: Normal size. Minimal aortic root calcifications. No dissection.   Aortic Valve:  Trileaflet.  No calcifications.   Coronary Arteries:  Normal coronary origin.  Right dominance.   RCA is a large dominant artery that gives rise to PDA and PLA. There is no plaque.   Left main is a large artery that gives rise to LAD, Ramus and LCX arteries. There is no disease in the left main or Ramus arteries.   LAD has no plaque.   LCX is a non-dominant artery that gives rise to two obtuse marginal branches. There is no plaque.   Other findings:   Normal pulmonary vein drainage into the left atrium.   Normal left atrial appendage without a thrombus.   Normal size of the pulmonary artery.   IMPRESSION: 1. Coronary calcium score of 0. Patient  is low risk for coronary events.   2. Normal coronary origin with right dominance.   3. No evidence of CAD.   4. CAD-RADS 0. No evidence of CAD (0%). Consider non-atherosclerotic causes of chest pain. __________   2D echo (outside office) 04/2020: EF 60 to 65%, normal wall motion, diastolic dysfunction, normal RV systolic function and ventricular cavity size, mild mitral  regurgitation, trace tricuspid regurgitation __________   ETT 10/2018: Blood pressure demonstrated a normal response to exercise. There was no ST segment deviation noted during stress. No T wave inversion was noted during stress.   Normal treadmill stress test with no evidence of ischemia. Average exercise capacity with an exercise duration of 7 minutes and 38 seconds achieving a workload of 9.5 METS. __________   Luci [employer] patch-2 08/2018: Normal sinus rhythm with an average heart rate of 74 bpm. Rare PACs and PVCs.  Overall burden is less than 1%. __________   Luci [employer] patch-1 08/2018: Normal sinus rhythm with an average heart rate of 74 bpm. Isolated PACs. Occasional PVCs with an overall burden of 2.4%.  The burden of PVCs might be overestimated due to artifacts. __________   2D echo 03/2018: - Left ventricle: The cavity size was normal. Wall thickness was    normal. Systolic function was normal. The estimated ejection    fraction was in the range of 60% to 65%. Wall motion was normal;    there were no regional wall motion abnormalities. Left    ventricular diastolic function parameters were normal.  - Right ventricle: The cavity size was normal. Wall thickness was    at the upper limits of normal. Systolic function was normal.  - Pulmonary arteries: Systolic pressure could not be accurately    estimated.  __________   48-hour Holter (outside office) 03/2018: Sinus rhythm with an average heart rate of 70 bpm (range 41 to 118 bpm) with a 1% burden of PVCs and a less than 1% burden of PACs   EKG:  EKG is ordered  today.  The EKG ordered today demonstrates NSR, 68 bpm, no acute ST-T changes  Recent Labs: 09/08/2023: ALT 26; BUN 22; Creatinine, Ser 0.80; Hemoglobin 14.3; Platelets 233; Potassium 4.4; Sodium 139; TSH 1.280  Recent Lipid Panel    Component Value Date/Time   CHOL 246 (H) 09/08/2023 0912   TRIG 186 (H) 09/08/2023 0912   HDL 73 09/08/2023 0912   LDLCALC 140 (H) 09/08/2023 0912    PHYSICAL EXAM:    VS:  BP 122/81 (BP Location: Left Arm, Patient Position: Sitting, Cuff Size: Normal)   Pulse 68   Ht 5\' 10"  (1.778 m)   Wt 185 lb 3.2 oz (84 kg)   SpO2 98%   BMI 26.57 kg/m   BMI: Body mass index is 26.57 kg/m.  Physical Exam Vitals reviewed.  Constitutional:      Appearance: She is well-developed.  HENT:     Head: Normocephalic and atraumatic.  Eyes:     General:        Right eye: No discharge.        Left eye: No discharge.  Neck:     Vascular: No JVD.  Cardiovascular:     Rate and Rhythm: Normal rate and regular rhythm.     Heart sounds: Normal heart sounds, S1 normal and S2 normal. Heart sounds not distant. No midsystolic click and no opening snap. No murmur heard.    No friction rub.  Pulmonary:     Effort: Pulmonary effort is normal. No respiratory distress.     Breath sounds: Normal breath sounds. No decreased breath sounds, wheezing, rhonchi or rales.  Chest:     Chest wall: No tenderness.  Abdominal:     General: There is no distension.  Musculoskeletal:     Cervical back: Normal range of motion.  Skin:    General: Skin is warm and dry.  Nails: There is no clubbing.  Neurological:     Mental Status: She is alert and oriented to person, place, and time.  Psychiatric:        Speech: Speech normal.        Behavior: Behavior normal.        Thought Content: Thought content normal.        Judgment: Judgment normal.     Wt Readings from Last 3 Encounters:  09/17/23 185 lb 3.2 oz (84 kg)  06/11/23 185 lb 9.6 oz (84.2 kg)  06/09/23 187 lb (84.8 kg)      ASSESSMENT & PLAN:   Palpitations/PVCs: Quiescent.  She remains on Lopressor 12.5 mg twice daily.  HTN: Blood pressure is well-controlled in the office today.  She remains on Lopressor as outlined above along with Diovan/HCT 80/12.5 mg daily.  Diastolic dysfunction: Continue optimal blood pressure control.  Not requiring a standing loop diuretic.  Chest pain: No further symptoms.  Coronary CTA in 2022 showed no evidence of CAD.  Continue primary prevention and aggressive risk factor modification.  HLD: LDL 140 in 08/2023.  She reports adherence to atorvastatin 10 mg.  She does report some dietary indiscretion.  We will repeat a fasting lipid panel in 3 months with further recommendations at that time.     Disposition: F/u with Dr. Kirke Corin or an APP in 12 months, sooner if needed.   Medication Adjustments/Labs and Tests Ordered: Current medicines are reviewed at length with the patient today.  Concerns regarding medicines are outlined above. Medication changes, Labs and Tests ordered today are summarized above and listed in the Patient Instructions accessible in Encounters.   Signed, Eula Listen, PA-C 09/17/2023 5:21 PM     [facility] [address] [place], [place] [address] [phone]

## 2023-09-17 ENCOUNTER — Ambulatory Visit: Payer: No Typology Code available for payment source | Attending: Physician Assistant | Admitting: Physician Assistant

## 2023-09-17 ENCOUNTER — Encounter: Payer: Self-pay | Admitting: Physician Assistant

## 2023-09-17 VITALS — BP 122/81 | HR 68 | Ht 70.0 in | Wt 185.2 lb

## 2023-09-17 DIAGNOSIS — I493 Ventricular premature depolarization: Secondary | ICD-10-CM

## 2023-09-17 DIAGNOSIS — R002 Palpitations: Secondary | ICD-10-CM | POA: Diagnosis not present

## 2023-09-17 DIAGNOSIS — I10 Essential (primary) hypertension: Secondary | ICD-10-CM

## 2023-09-17 DIAGNOSIS — I5189 Other ill-defined heart diseases: Secondary | ICD-10-CM | POA: Diagnosis not present

## 2023-09-17 DIAGNOSIS — E782 Mixed hyperlipidemia: Secondary | ICD-10-CM

## 2023-09-17 NOTE — Patient Instructions (Signed)
Medication Instructions:  Your Physician recommend you continue on your current medication as directed.    *If you need a refill on your cardiac medications before your next appointment, please call your pharmacy*   Lab Work: Your provider would like for you to return in 3 months to have the following labs drawn: FASTING Lipid panel, and liver function test.   Please go to [facility] [address] ([facility] [address], [place] [address] You do not need an appointment.  They are open from 8 am- 4:30 pm.  Lunch from 1:00 pm- 2:00 pm You DO need to be fasting.   You may also go to one of the following [facility]:  [address] [place], [place] [address] Phone: [phone] Lab hours: Mon-Fri 8 am- 5 pm    Lunch 12 pm- 1 pm  [address] [place],  [place]  [address]  [place] Phone: [phone] Lab hours: 7 am- 4 pm Lunch 12 pm-1 pm   [address] [place],  [place]  [address]  [place] Phone: [phone] Lab hours: Mon-Fri 8 am- 5 pm    Lunch 12 pm- 1 pm  If you have labs (blood work) drawn today and your tests are completely normal, you will receive your results only by: MyChart Message (if you have MyChart) OR A paper copy in the mail If you have any lab test that is abnormal or we need to change your treatment, we will call you to review the results.   Follow-Up: At [facility], you and your health needs are our priority.  As part of our continuing mission to provide you with exceptional heart care, we have created designated Provider Care Teams.  These Care Teams include your primary Cardiologist (physician) and Advanced Practice Providers (APPs -  Physician Assistants and Nurse Practitioners) who all work together to provide you with the care you need, when you need it.   Your next appointment:   12 month(s)  Provider:   You may see Lorine Bears, MD or one of the following Advanced Practice Providers on your designated Care Team:   Eula Listen,  [place]

## 2023-09-22 ENCOUNTER — Other Ambulatory Visit: Payer: Self-pay | Admitting: Physician Assistant

## 2023-09-22 DIAGNOSIS — E785 Hyperlipidemia, unspecified: Secondary | ICD-10-CM

## 2023-10-08 ENCOUNTER — Ambulatory Visit (INDEPENDENT_AMBULATORY_CARE_PROVIDER_SITE_OTHER): Payer: No Typology Code available for payment source | Admitting: Physician Assistant

## 2023-10-08 ENCOUNTER — Encounter: Payer: Self-pay | Admitting: Physician Assistant

## 2023-10-08 VITALS — BP 128/83 | HR 95 | Temp 97.9°F | Resp 16 | Ht 70.0 in | Wt 190.4 lb

## 2023-10-08 DIAGNOSIS — I10 Essential (primary) hypertension: Secondary | ICD-10-CM | POA: Diagnosis not present

## 2023-10-08 DIAGNOSIS — F411 Generalized anxiety disorder: Secondary | ICD-10-CM

## 2023-10-08 DIAGNOSIS — E782 Mixed hyperlipidemia: Secondary | ICD-10-CM | POA: Diagnosis not present

## 2023-10-08 DIAGNOSIS — G43809 Other migraine, not intractable, without status migrainosus: Secondary | ICD-10-CM | POA: Diagnosis not present

## 2023-10-08 MED ORDER — NAPROXEN SODIUM 550 MG PO TABS: 550.0000 mg | ORAL_TABLET | Freq: Every day | ORAL | 2 refills | Status: AC | PRN

## 2023-10-08 MED ORDER — VENLAFAXINE HCL ER 75 MG PO CP24: 75.0000 mg | ORAL_CAPSULE | Freq: Every day | ORAL | 1 refills | Status: AC

## 2023-10-08 MED ORDER — ALPRAZOLAM 0.25 MG PO TABS: 0.2500 mg | ORAL_TABLET | Freq: Every day | ORAL | 0 refills | Status: AC | PRN

## 2023-10-08 MED ORDER — VALSARTAN-HYDROCHLOROTHIAZIDE 80-12.5 MG PO TABS: ORAL_TABLET | ORAL | 1 refills | Status: DC

## 2023-10-08 NOTE — Progress Notes (Signed)
[facility] [address] [place], [place] [address]  Internal MEDICINE  Office Visit Note  Patient Name: Laura Barber  [id number]  [id number]  Date of Service: 10/14/2023  Chief Complaint  Patient presents with   Follow-up   Gastroesophageal Reflux   Hypertension   Hyperlipidemia    HPI Pt is here for routine follow up -Did have a headache behind left eye last week, but also jaw hurting and wonders if it was sinus releated as she now has some sinus congestion. A little pressure in sinuses and ears, but not blowing anything out.  -Got better with sinus meds and flonase. May be related to weather changes as well -does have hx of migraines, but this was different. Did discuss if new or worsening headache in future to go to ED -has not needed xanax recently, uses only half very rarely -naproxen only prn -labs reviewed--cholesterol elevated, has been taking lipitor. Otherwise labs look ok -cardiology has prdered repeat lipid and hepatic panel in April  Current Medication: Outpatient Encounter Medications as of 10/08/2023  Medication Sig   ALPRAZolam (XANAX) 0.25 MG tablet Take 1 tablet (0.25 mg total) by mouth daily as needed for anxiety.   atorvastatin (LIPITOR) 10 MG tablet TAKE ONE TABLET BY MOUTH EVERY DAY AT 6PM   Azelaic Acid 15 % gel Apply topically daily.   cetirizine (ZYRTEC) 10 MG tablet Take 10 mg by mouth daily.   clobetasol (TEMOVATE) 0.05 % external solution Apply topically 2 (two) times daily.   Clobetasol Propionate 0.05 % shampoo Apply 1 Application topically 3 (three) times a week.   desonide (DESOWEN) 0.05 % cream APPLY A SMALL AMOUNT TO SKIN TWICE DAILYAS NEEDED   estradiol (VIVELLE-DOT) 0.0375 MG/24HR Place 1 patch onto the skin 2 (two) times a week.   methocarbamol (ROBAXIN) 750 MG tablet Take 1 tablet as needed by oral route at bedtime.   metoprolol tartrate (LOPRESSOR) 25 MG tablet Take 0.5 tablets (12.5 mg total) by mouth 2 (two) times  daily.   Multiple Vitamin (MULTIVITAMIN) tablet Take 1 tablet by mouth daily.   omeprazole (PRILOSEC) 20 MG capsule Take 1 capsule (20 mg total) by mouth daily.   pregabalin (LYRICA) 75 MG capsule Take 75 mg by mouth 2 (two) times daily.   RHOFADE 1 % CREA Apply topically.   rizatriptan (MAXALT) 5 MG tablet Take 1 tablet (5 mg total) by mouth as needed for migraine.   valACYclovir (VALTREX) 1000 MG tablet TAKE (1) TABLET BY MOUTH AS NEEDED   [DISCONTINUED] ALPRAZolam (XANAX) 0.5 MG tablet Take 1 tablet (0.5 mg total) by mouth at bedtime as needed for anxiety.   [DISCONTINUED] naproxen sodium (ANAPROX) 550 MG tablet TAKE (1) TABLET BY MOUTH TWICE DAILY WITH MEALS   [DISCONTINUED] valsartan-hydrochlorothiazide (DIOVAN-HCT) 80-12.5 MG tablet TAKE (1) TABLET BY MOUTH EVERY DAY   [DISCONTINUED] venlafaxine XR (EFFEXOR-XR) 75 MG 24 hr capsule TAKE (1) CAPSULE BY MOUTH EVERY MORNING WITH BREAKFAST   naproxen sodium (ANAPROX) 550 MG tablet Take 1 tablet (550 mg total) by mouth daily as needed.   valsartan-hydrochlorothiazide (DIOVAN-HCT) 80-12.5 MG tablet TAKE (1) TABLET BY MOUTH EVERY DAY   venlafaxine XR (EFFEXOR-XR) 75 MG 24 hr capsule Take 1 capsule (75 mg total) by mouth daily with breakfast.   No facility-administered encounter medications on file as of 10/08/2023.    Surgical History: Past Surgical History:  Procedure Laterality Date   ABDOMINAL HYSTERECTOMY  2005   Radical hysterectomy by Dr Kyla Balzarine at [facility] cervical cancer  1B1   BREAST CYST ASPIRATION Left 05/30/2013   CERVICAL DISC ARTHROPLASTY Right 06/11/2016   Procedure: CERVICAL ANTERIOR DISC ARTHROPLASTY CERVICAL 5-7;  Surgeon: Karenann Cai, MD;  Location: [facility] ORS;  Service: Neurosurgery;  Laterality: Right;   DIAGNOSTIC LAPAROSCOPY  2005   DILATION AND CURETTAGE OF UTERUS     SAB   LEEP  2005   Dr. Luella Cook - invasive endocervical adenocarcinoma.    NASAL SINUS SURGERY      Medical History: Past Medical History:   Diagnosis Date   Anxiety    Cancer (HCC)    cervical   Cervical cancer, FIGO stage IB1 (HCC) 2005   Cervical   GERD (gastroesophageal reflux disease)    Headache    Hyperlipidemia    Hypertension    Migraine    Shingles 2019    Family History: Family History  Problem Relation Age of Onset   Lung cancer Mother    Hyperlipidemia Mother    Diabetes Father    Hypertension Father    Heart failure Father    Heart attack Father    Heart disease Father    Hyperlipidemia Brother    Breast cancer Neg Hx     Social History   Socioeconomic History   Marital status: Divorced    Spouse name: Not on file   Number of children: 0   Years of education: Not on file   Highest education level: Not on file  Occupational History   Not on file  Tobacco Use   Smoking status: Never   Smokeless tobacco: Never  Vaping Use   Vaping status: Never Used  Substance and Sexual Activity   Alcohol use: Yes    Comment: occassional   Drug use: No   Sexual activity: Yes    Partners: Male    Birth control/protection: Surgical    Comment: Hysterectomy  Other Topics Concern   Not on file  Social History Narrative   Not on file   Social Drivers of Health   Financial Resource Strain: Low Risk  (12/10/2022)   Received from [facility], [facility]   Overall Financial Resource Strain (CARDIA)    Difficulty of Paying Living Expenses: Not hard at all  Food Insecurity: No Food Insecurity (12/10/2022)   Received from [facility], [facility]   Hunger Vital Sign    Worried About Running Out of Food in the Last Year: Never true    Ran Out of Food in the Last Year: Never true  Transportation Needs: No Transportation Needs (12/10/2022)   Received from [facility], [facility]   PRAPARE - Transportation    In the past 12 months, has lack of transportation kept you from medical appointments or  from getting medications?: No    Lack of Transportation (Non-Medical): No  Physical Activity: Not on file  Stress: Not on file  Social Connections: Not on file  Intimate Partner Violence: Not on file      Review of Systems  Constitutional:  Negative for chills, fatigue and unexpected weight change.  HENT:  Negative for congestion, rhinorrhea, sneezing and sore throat.   Eyes:  Negative for redness.  Respiratory:  Negative for cough, chest tightness and shortness of breath.   Cardiovascular:  Negative for chest pain and palpitations.  Gastrointestinal:  Negative for abdominal pain, constipation, diarrhea, nausea and vomiting.  Genitourinary:  Negative for dysuria and frequency.  Musculoskeletal:  Negative for arthralgias,  back pain, joint swelling and neck pain.  Skin:  Negative for rash.  Neurological: Negative.  Negative for tremors and numbness.  Hematological:  Negative for adenopathy. Does not bruise/bleed easily.  Psychiatric/Behavioral:  Negative for behavioral problems (Depression), sleep disturbance and suicidal ideas. The patient is not nervous/anxious.     Vital Signs: BP 128/83   Pulse 95   Temp 97.9 F (36.6 C)   Resp 16   Ht 5\' 10"  (1.778 m)   Wt 190 lb 6.4 oz (86.4 kg)   SpO2 98%   BMI 27.32 kg/m    Physical Exam Vitals and nursing note reviewed.  Constitutional:      General: She is not in acute distress.    Appearance: Normal appearance. She is well-developed and normal weight. She is not diaphoretic.  HENT:     Head: Normocephalic and atraumatic.     Right Ear: Tympanic membrane normal.     Left Ear: Tympanic membrane normal.  Neck:     Thyroid: No thyromegaly.     Vascular: No JVD.     Trachea: No tracheal deviation.  Cardiovascular:     Rate and Rhythm: Normal rate and regular rhythm.     Heart sounds: Normal heart sounds. No murmur heard.    No friction rub. No gallop.  Pulmonary:     Effort: Pulmonary effort is normal.  Musculoskeletal:         General: Normal range of motion.  Skin:    General: Skin is warm and dry.  Neurological:     Mental Status: She is alert and oriented to person, place, and time.     Cranial Nerves: No cranial nerve deficit.  Psychiatric:        Behavior: Behavior normal.        Thought Content: Thought content normal.        Judgment: Judgment normal.        Assessment/Plan: 1. Hypertension, unspecified type (Primary) Stable, continue current medication - valsartan-hydrochlorothiazide (DIOVAN-HCT) 80-12.5 MG tablet; TAKE (1) TABLET BY MOUTH EVERY DAY  Dispense: 90 tablet; Refill: 1  2. Generalized anxiety disorder Continue effexor, may use xanax prn - venlafaxine XR (EFFEXOR-XR) 75 MG 24 hr capsule; Take 1 capsule (75 mg total) by mouth daily with breakfast.  Dispense: 90 capsule; Refill: 1 - ALPRAZolam (XANAX) 0.25 MG tablet; Take 1 tablet (0.25 mg total) by mouth daily as needed for anxiety.  Dispense: 5 tablet; Refill: 0  3. Other migraine without status migrainosus, not intractable Continue effexor, may use naproxen as needed--cautioned against overuse. - naproxen sodium (ANAPROX) 550 MG tablet; Take 1 tablet (550 mg total) by mouth daily as needed.  Dispense: 30 tablet; Refill: 2 - venlafaxine XR (EFFEXOR-XR) 75 MG 24 hr capsule; Take 1 capsule (75 mg total) by mouth daily with breakfast.  Dispense: 90 capsule; Refill: 1  4. Mixed hyperlipidemia Elevated on recent labs despite taking lipitor still. Cardiology already reviewed this with her and ordered repeat testing to be done in a few months   General Counseling: Eavan verbalizes understanding of the findings of todays visit and agrees with plan of treatment. I have discussed any further diagnostic evaluation that may be needed or ordered today. We also reviewed her medications today. she has been encouraged to call the office with any questions or concerns that should arise related to todays visit.    No orders of the defined  types were placed in this encounter.   Meds ordered this encounter  Medications  valsartan-hydrochlorothiazide (DIOVAN-HCT) 80-12.5 MG tablet    Sig: TAKE (1) TABLET BY MOUTH EVERY DAY    Dispense:  90 tablet    Refill:  1   naproxen sodium (ANAPROX) 550 MG tablet    Sig: Take 1 tablet (550 mg total) by mouth daily as needed.    Dispense:  30 tablet    Refill:  2   venlafaxine XR (EFFEXOR-XR) 75 MG 24 hr capsule    Sig: Take 1 capsule (75 mg total) by mouth daily with breakfast.    Dispense:  90 capsule    Refill:  1   ALPRAZolam (XANAX) 0.25 MG tablet    Sig: Take 1 tablet (0.25 mg total) by mouth daily as needed for anxiety.    Dispense:  5 tablet    Refill:  0    This patient was seen by Lynn Ito, PA-C in collaboration with Dr. Beverely Risen as a part of collaborative care agreement.   Total time spent:30 Minutes Time spent includes review of chart, medications, test results, and follow up plan with the patient.      Dr Lyndon Code Internal medicine

## 2023-12-11 ENCOUNTER — Other Ambulatory Visit: Payer: Self-pay | Admitting: Physician Assistant

## 2023-12-18 ENCOUNTER — Ambulatory Visit: Payer: Self-pay

## 2023-12-18 DIAGNOSIS — Z83719 Family history of colon polyps, unspecified: Secondary | ICD-10-CM | POA: Diagnosis not present

## 2023-12-18 DIAGNOSIS — Z1211 Encounter for screening for malignant neoplasm of colon: Secondary | ICD-10-CM | POA: Diagnosis present

## 2023-12-18 DIAGNOSIS — D123 Benign neoplasm of transverse colon: Secondary | ICD-10-CM | POA: Diagnosis not present

## 2023-12-31 ENCOUNTER — Encounter: Payer: No Typology Code available for payment source | Admitting: Physician Assistant

## 2024-02-04 ENCOUNTER — Telehealth: Payer: Self-pay | Admitting: Physician Assistant

## 2024-02-04 ENCOUNTER — Other Ambulatory Visit: Payer: Self-pay | Admitting: Physician Assistant

## 2024-02-04 DIAGNOSIS — I10 Essential (primary) hypertension: Secondary | ICD-10-CM

## 2024-02-04 NOTE — Telephone Encounter (Signed)
 Lvm to schedule appointment for medication refill-Toni

## 2024-02-17 LAB — HEPATIC FUNCTION PANEL
ALT: 19 IU/L (ref 0–32)
AST: 19 IU/L (ref 0–40)
Albumin: 4.6 g/dL (ref 3.8–4.9)
Alkaline Phosphatase: 44 IU/L (ref 44–121)
Bilirubin Total: 0.5 mg/dL (ref 0.0–1.2)
Bilirubin, Direct: 0.16 mg/dL (ref 0.00–0.40)
Total Protein: 7.1 g/dL (ref 6.0–8.5)

## 2024-02-17 LAB — LIPID PANEL
Chol/HDL Ratio: 3.4 ratio (ref 0.0–4.4)
Cholesterol, Total: 212 mg/dL — ABNORMAL HIGH (ref 100–199)
HDL: 62 mg/dL (ref >39)
LDL Chol Calc (NIH): 121 mg/dL — ABNORMAL HIGH (ref 0–99)
Triglycerides: 164 mg/dL — ABNORMAL HIGH (ref 0–149)
VLDL Cholesterol Cal: 29 mg/dL (ref 5–40)

## 2024-02-18 ENCOUNTER — Ambulatory Visit: Payer: Self-pay | Admitting: Physician Assistant

## 2024-03-15 MED ORDER — METOPROLOL TARTRATE 25 MG PO TABS: 12.5000 mg | ORAL_TABLET | Freq: Two times a day (BID) | ORAL | 2 refills | Status: AC

## 2024-04-18 ENCOUNTER — Other Ambulatory Visit: Payer: Self-pay | Admitting: Obstetrics and Gynecology

## 2024-04-18 DIAGNOSIS — N951 Menopausal and female climacteric states: Secondary | ICD-10-CM

## 2024-04-18 DIAGNOSIS — Z79890 Hormone replacement therapy (postmenopausal): Secondary | ICD-10-CM

## 2024-05-18 ENCOUNTER — Other Ambulatory Visit: Payer: Self-pay | Admitting: Obstetrics and Gynecology

## 2024-05-18 ENCOUNTER — Encounter: Payer: Self-pay | Admitting: Obstetrics and Gynecology

## 2024-05-18 ENCOUNTER — Other Ambulatory Visit: Payer: Self-pay

## 2024-05-18 DIAGNOSIS — N951 Menopausal and female climacteric states: Secondary | ICD-10-CM

## 2024-05-18 DIAGNOSIS — Z79890 Hormone replacement therapy (postmenopausal): Secondary | ICD-10-CM

## 2024-05-18 NOTE — Telephone Encounter (Signed)
 Patient has scheduled her annual physical for 06/16/24. Prescription has been filled. KW

## 2024-06-15 ENCOUNTER — Other Ambulatory Visit: Payer: Self-pay | Admitting: Obstetrics and Gynecology

## 2024-06-15 ENCOUNTER — Encounter: Payer: Self-pay | Admitting: Obstetrics and Gynecology

## 2024-06-15 DIAGNOSIS — N951 Menopausal and female climacteric states: Secondary | ICD-10-CM

## 2024-06-15 DIAGNOSIS — Z79890 Hormone replacement therapy (postmenopausal): Secondary | ICD-10-CM

## 2024-06-15 MED ORDER — ESTRADIOL 0.0375 MG/24HR TD PTTW: 1.0000 | MEDICATED_PATCH | TRANSDERMAL | 0 refills | Status: DC

## 2024-06-16 ENCOUNTER — Encounter: Payer: Self-pay | Admitting: Obstetrics and Gynecology

## 2024-06-16 ENCOUNTER — Ambulatory Visit: Admitting: Obstetrics and Gynecology

## 2024-06-16 VITALS — BP 121/75 | HR 71 | Ht 70.0 in | Wt 189.0 lb

## 2024-06-16 DIAGNOSIS — Z79890 Hormone replacement therapy (postmenopausal): Secondary | ICD-10-CM

## 2024-06-16 DIAGNOSIS — Z1231 Encounter for screening mammogram for malignant neoplasm of breast: Secondary | ICD-10-CM

## 2024-06-16 DIAGNOSIS — N951 Menopausal and female climacteric states: Secondary | ICD-10-CM | POA: Diagnosis not present

## 2024-06-16 DIAGNOSIS — Z01419 Encounter for gynecological examination (general) (routine) without abnormal findings: Secondary | ICD-10-CM | POA: Diagnosis not present

## 2024-06-16 DIAGNOSIS — Z90710 Acquired absence of both cervix and uterus: Secondary | ICD-10-CM

## 2024-06-16 MED ORDER — ESTRADIOL 0.0375 MG/24HR TD PTTW: 1.0000 | MEDICATED_PATCH | TRANSDERMAL | 3 refills | Status: AC

## 2024-06-16 NOTE — Progress Notes (Signed)
 PCP: Kristina Tinnie POUR, PA-C   Chief Complaint  Patient presents with   Gynecologic Exam    No concerns    HPI:      Laura Barber is a 58 y.o. G1P0010 whose LMP was No LMP recorded. Patient has had a hysterectomy., presents today for her annual examination.  Her menses are absent due to TAH 2015 for cervical cancer by Dr. Ivery at [facility] No PMB. No VS sx on vivelle  dot 0.0375, doing well, wants to continue.   Sex activity: single partner, contraception - status post hysterectomy. She does not have vaginal dryness/pain/bleeding.  Last Pap: 05/19/22 Results were: no abnormalities /neg HPV DNA. Hx of cervical cancer FIGO IB1, 2015. Repeat paps due Q3 yrs for 25 yrs per [facility]. Hx of STDs: HPV  Last mammogram: 09/09/23 Results were: normal--routine follow-up in 12 months There is no FH of breast cancer. There is no FH of ovarian cancer. The patient does occas do self-breast exams.  Colonoscopy: 2019 with Dr. Aundria, Repeat due after 10 years pet pt.   Tobacco use: The patient denies current or previous tobacco use. Alcohol use: social drinker No drug use Exercise: min active  She does get adequate calcium  and Vitamin D  in her diet.  Labs with PCP.   Patient Active Problem List   Diagnosis Date Noted   Menopausal symptoms 05/18/2022   Anxiety 01/25/2019   Palpitations 01/25/2019   Hypertension    Hyperlipidemia    GERD (gastroesophageal reflux disease)    Cervical cancer, FIGO stage IB1 (HCC) 01/14/2017   Cervical stenosis of spine 06/11/2016   History of cervical cancer 06/27/2014    Past Surgical History:  Procedure Laterality Date   ABDOMINAL HYSTERECTOMY  2005   Radical hysterectomy by Dr Ivery at [facility] cervical cancer 1B1   BREAST CYST ASPIRATION Left 05/30/2013   CERVICAL DISC ARTHROPLASTY Right 06/11/2016   Procedure: CERVICAL ANTERIOR DISC ARTHROPLASTY CERVICAL 5-7;  Surgeon: Deatrice Figures, MD;  Location: [facility] ORS;  Service: Neurosurgery;   Laterality: Right;   DIAGNOSTIC LAPAROSCOPY  2005   DILATION AND CURETTAGE OF UTERUS     SAB   LEEP  2005   Dr. Rolm - invasive endocervical adenocarcinoma.    NASAL SINUS SURGERY      Family History  Problem Relation Age of Onset   Lung cancer Mother    Hyperlipidemia Mother    Diabetes Father    Hypertension Father    Heart failure Father    Heart attack Father    Heart disease Father    Hyperlipidemia Brother    Breast cancer Neg Hx     Social History   Socioeconomic History   Marital status: Divorced    Spouse name: Not on file   Number of children: 0   Years of education: Not on file   Highest education level: Not on file  Occupational History   Not on file  Tobacco Use   Smoking status: Never   Smokeless tobacco: Never  Vaping Use   Vaping status: Never Used  Substance and Sexual Activity   Alcohol use: Yes    Comment: occassional   Drug use: No   Sexual activity: Yes    Partners: Male    Birth control/protection: Surgical    Comment: Hysterectomy  Other Topics Concern   Not on file  Social History Narrative   Not on file   Social Drivers of Health   Financial Resource Strain: Low Risk  (11/04/2023)  Received from [facility]   Overall Financial Resource Strain (CARDIA)    Difficulty of Paying Living Expenses: Not hard at all  Food Insecurity: No Food Insecurity (11/04/2023)   Received from [facility]   Hunger Vital Sign    Within the past 12 months, you worried that your food would run out before you got the money to buy more.: Never true    Within the past 12 months, the food you bought just didn't last and you didn't have money to get more.: Never true  Transportation Needs: No Transportation Needs (11/04/2023)   Received from [facility] - Transportation    In the past 12 months, has lack of transportation kept you from medical appointments or from getting medications?: No     Lack of Transportation (Non-Medical): No  Physical Activity: Not on file  Stress: Not on file  Social Connections: Not on file  Intimate Partner Violence: Not on file     Current Outpatient Medications:    ALPRAZolam  (XANAX ) 0.25 MG tablet, Take 1 tablet (0.25 mg total) by mouth daily as needed for anxiety., Disp: 5 tablet, Rfl: 0   atorvastatin  (LIPITOR) 10 MG tablet, TAKE ONE TABLET BY MOUTH EVERY DAY AT 6PM, Disp: 90 tablet, Rfl: 1   Azelaic Acid 15 % gel, Apply topically daily., Disp: , Rfl:    busPIRone (BUSPAR) 5 MG tablet, Take 5 mg by mouth., Disp: , Rfl:    cetirizine (ZYRTEC) 10 MG tablet, Take 10 mg by mouth daily., Disp: , Rfl:    clobetasol (TEMOVATE) 0.05 % external solution, Apply topically 2 (two) times daily., Disp: , Rfl:    Clobetasol Propionate 0.05 % shampoo, Apply 1 Application topically 3 (three) times a week., Disp: , Rfl:    desonide  (DESOWEN ) 0.05 % cream, APPLY A SMALL AMOUNT TO SKIN TWICE DAILYAS NEEDED, Disp: 30 g, Rfl: 3   fluticasone  (FLONASE ) 50 MCG/ACT nasal spray, Place 2 sprays into the nose., Disp: , Rfl:    levocetirizine (XYZAL) 5 MG tablet, Take 5 mg by mouth., Disp: , Rfl:    methocarbamol (ROBAXIN) 750 MG tablet, Take 1 tablet as needed by oral route at bedtime., Disp: , Rfl:    metoprolol  tartrate (LOPRESSOR ) 25 MG tablet, Take 0.5 tablets (12.5 mg total) by mouth 2 (two) times daily., Disp: 90 tablet, Rfl: 2   Multiple Vitamin (MULTIVITAMIN) tablet, Take 1 tablet by mouth daily., Disp: , Rfl:    naproxen  (NAPROSYN ) 500 MG tablet, Take 500 mg by mouth., Disp: , Rfl:    naproxen  sodium (ANAPROX ) 550 MG tablet, Take 1 tablet (550 mg total) by mouth daily as needed., Disp: 30 tablet, Rfl: 2   omeprazole  (PRILOSEC) 20 MG capsule, Take 1 capsule (20 mg total) by mouth daily., Disp: 30 capsule, Rfl: 3   pregabalin (LYRICA) 75 MG capsule, Take 75 mg by mouth 2 (two) times daily., Disp: , Rfl:    RHOFADE 1 % CREA, Apply topically., Disp: , Rfl:     rizatriptan  (MAXALT ) 5 MG tablet, Take 1 tablet (5 mg total) by mouth as needed for migraine., Disp: 10 tablet, Rfl: 2   valACYclovir  (VALTREX ) 1000 MG tablet, TAKE (1) TABLET BY MOUTH AS NEEDED, Disp: 30 tablet, Rfl: 2   valsartan -hydrochlorothiazide  (DIOVAN -HCT) 80-12.5 MG tablet, TAKE (1) TABLET BY MOUTH EVERY DAY, Disp: 30 tablet, Rfl: 0   venlafaxine  XR (EFFEXOR -XR) 75 MG 24 hr capsule, Take 1 capsule (75 mg total) by  mouth daily with breakfast., Disp: 90 capsule, Rfl: 1   estradiol  (VIVELLE -DOT) 0.0375 MG/24HR, Place 1 patch onto the skin 2 (two) times a week., Disp: 24 patch, Rfl: 3     ROS:  Review of Systems  Constitutional:  Negative for fatigue, fever and unexpected weight change.  Respiratory:  Negative for cough, shortness of breath and wheezing.   Cardiovascular:  Negative for chest pain, palpitations and leg swelling.  Gastrointestinal:  Negative for blood in stool, constipation, diarrhea, nausea and vomiting.  Endocrine: Negative for cold intolerance, heat intolerance and polyuria.  Genitourinary:  Negative for dyspareunia, dysuria, flank pain, frequency, genital sores, hematuria, menstrual problem, pelvic pain, urgency, vaginal bleeding, vaginal discharge and vaginal pain.  Musculoskeletal:  Negative for back pain, joint swelling and myalgias.  Skin:  Negative for rash.  Neurological:  Negative for dizziness, syncope, light-headedness, numbness and headaches.  Hematological:  Negative for adenopathy.  Psychiatric/Behavioral:  Negative for agitation, confusion, sleep disturbance and suicidal ideas. The patient is not nervous/anxious.    BREAST: No symptoms    Objective: BP 121/75   Pulse 71   Ht 5' 10 (1.778 m)   Wt 189 lb (85.7 kg)   BMI 27.12 kg/m    Physical Exam Constitutional:      Appearance: She is well-developed.  Genitourinary:     Vulva normal.     Genitourinary Comments: UTERUS/CX SURG REM     Right Labia: No rash, tenderness or lesions.    Left  Labia: No tenderness, lesions or rash.    Vaginal cuff intact.    No vaginal discharge, erythema or tenderness.     Mild vaginal atrophy present.     Right Adnexa: not tender and no mass present.    Left Adnexa: not tender and no mass present.    Cervix is absent.     Uterus is absent.  Breasts:    Right: No mass, nipple discharge, skin change or tenderness.     Left: No mass, nipple discharge, skin change or tenderness.  Neck:     Thyroid : No thyromegaly.  Cardiovascular:     Rate and Rhythm: Normal rate and regular rhythm.     Heart sounds: Normal heart sounds. No murmur heard. Pulmonary:     Effort: Pulmonary effort is normal.     Breath sounds: Normal breath sounds.  Abdominal:     Palpations: Abdomen is soft.     Tenderness: There is no abdominal tenderness. There is no guarding.  Musculoskeletal:        General: Normal range of motion.     Cervical back: Normal range of motion.  Neurological:     General: No focal deficit present.     Mental Status: She is alert and oriented to person, place, and time.     Cranial Nerves: No cranial nerve deficit.  Skin:    General: Skin is warm and dry.  Psychiatric:        Mood and Affect: Mood normal.        Behavior: Behavior normal.        Thought Content: Thought content normal.        Judgment: Judgment normal.  Vitals reviewed.     Assessment/Plan:  Encounter for annual routine gynecological examination  Cervical cancer, FIGO stage IB1 (HCC) - Plan:  repeat pap Q3 yrs for 25 yrs.   Encounter for screening mammogram for malignant neoplasm of breast - Plan: MM 3D SCREENING MAMMOGRAM BILATERAL BREAST; pt to schedule mammo  Hormone replacement therapy (HRT) - Plan: estradiol  (VIVELLE -DOT) 0.0375 MG/24HR  Menopausal symptoms - Plan: estradiol  (VIVELLE -DOT) 0.0375 MG/24HR; doing well, Rx RF eRxd.    Meds ordered this encounter  Medications   estradiol  (VIVELLE -DOT) 0.0375 MG/24HR    Sig: Place 1 patch onto the skin 2  (two) times a week.    Dispense:  24 patch    Refill:  3    Supervising Provider:   ROBY, MICIA [[id number]]           GYN counsel breast self exam, mammography screening, menopause, adequate intake of calcium  and vitamin D , diet and exercise    F/U  Return in about 1 year (around 06/16/2025).  Laura Knick B. Stehanie Ekstrom, PA-C 06/16/2024 4:28 PM

## 2024-06-16 NOTE — Patient Instructions (Signed)
 I value your feedback and you entrusting [place] with your care. If you get a [facility] patient survey, I would appreciate you taking the time to let us know about your experience today. Thank you!  [facility] ([place])--[phone]

## 2024-09-14 ENCOUNTER — Other Ambulatory Visit: Payer: Self-pay | Admitting: Nurse Practitioner

## 2024-09-14 DIAGNOSIS — Z1231 Encounter for screening mammogram for malignant neoplasm of breast: Secondary | ICD-10-CM

## 2024-10-12 ENCOUNTER — Ambulatory Visit: Admitting: Physician Assistant

## 2024-10-17 ENCOUNTER — Ambulatory Visit
